# Patient Record
Sex: Male | Born: 1940 | Race: White | Hispanic: No | Marital: Married | Smoking: Former smoker
Health system: Southern US, Community
[De-identification: ages and names within clinical notes are randomized; demographics above are authoritative.]

## PROBLEM LIST (undated history)

## (undated) DIAGNOSIS — M1990 Unspecified osteoarthritis, unspecified site: Secondary | ICD-10-CM

## (undated) DIAGNOSIS — E669 Obesity, unspecified: Secondary | ICD-10-CM

## (undated) DIAGNOSIS — I2510 Atherosclerotic heart disease of native coronary artery without angina pectoris: Secondary | ICD-10-CM

## (undated) DIAGNOSIS — F528 Other sexual dysfunction not due to a substance or known physiological condition: Secondary | ICD-10-CM

## (undated) DIAGNOSIS — R32 Unspecified urinary incontinence: Secondary | ICD-10-CM

## (undated) DIAGNOSIS — N189 Chronic kidney disease, unspecified: Secondary | ICD-10-CM

## (undated) DIAGNOSIS — G4733 Obstructive sleep apnea (adult) (pediatric): Secondary | ICD-10-CM

## (undated) DIAGNOSIS — F411 Generalized anxiety disorder: Secondary | ICD-10-CM

## (undated) DIAGNOSIS — I4892 Unspecified atrial flutter: Secondary | ICD-10-CM

## (undated) DIAGNOSIS — F1010 Alcohol abuse, uncomplicated: Secondary | ICD-10-CM

## (undated) DIAGNOSIS — J309 Allergic rhinitis, unspecified: Secondary | ICD-10-CM

## (undated) DIAGNOSIS — E785 Hyperlipidemia, unspecified: Secondary | ICD-10-CM

## (undated) DIAGNOSIS — E8881 Metabolic syndrome: Secondary | ICD-10-CM

## (undated) DIAGNOSIS — I10 Essential (primary) hypertension: Secondary | ICD-10-CM

## (undated) DIAGNOSIS — M109 Gout, unspecified: Secondary | ICD-10-CM

## (undated) DIAGNOSIS — Z9989 Dependence on other enabling machines and devices: Secondary | ICD-10-CM

## (undated) DIAGNOSIS — K219 Gastro-esophageal reflux disease without esophagitis: Secondary | ICD-10-CM

## (undated) DIAGNOSIS — I4819 Other persistent atrial fibrillation: Secondary | ICD-10-CM

## (undated) DIAGNOSIS — R319 Hematuria, unspecified: Secondary | ICD-10-CM

## (undated) DIAGNOSIS — N400 Enlarged prostate without lower urinary tract symptoms: Secondary | ICD-10-CM

## (undated) DIAGNOSIS — K449 Diaphragmatic hernia without obstruction or gangrene: Secondary | ICD-10-CM

## (undated) DIAGNOSIS — I495 Sick sinus syndrome: Secondary | ICD-10-CM

## (undated) HISTORY — DX: Alcohol abuse, uncomplicated: F10.10

## (undated) HISTORY — DX: Benign prostatic hyperplasia without lower urinary tract symptoms: N40.0

## (undated) HISTORY — DX: Other sexual dysfunction not due to a substance or known physiological condition: F52.8

## (undated) HISTORY — PX: CYSTOSCOPY: SUR368

## (undated) HISTORY — PX: APPENDECTOMY: SHX54

## (undated) HISTORY — DX: Essential (primary) hypertension: I10

## (undated) HISTORY — DX: Obesity, unspecified: E66.9

## (undated) HISTORY — DX: Unspecified atrial flutter: I48.92

## (undated) HISTORY — DX: Obstructive sleep apnea (adult) (pediatric): G47.33

## (undated) HISTORY — DX: Dependence on other enabling machines and devices: Z99.89

## (undated) HISTORY — DX: Diaphragmatic hernia without obstruction or gangrene: K44.9

## (undated) HISTORY — DX: Generalized anxiety disorder: F41.1

## (undated) HISTORY — DX: Hyperlipidemia, unspecified: E78.5

## (undated) HISTORY — PX: OTHER SURGICAL HISTORY: SHX169

## (undated) HISTORY — DX: Gastro-esophageal reflux disease without esophagitis: K21.9

## (undated) HISTORY — DX: Atherosclerotic heart disease of native coronary artery without angina pectoris: I25.10

## (undated) HISTORY — DX: Allergic rhinitis, unspecified: J30.9

## (undated) HISTORY — DX: Other persistent atrial fibrillation: I48.19

## (undated) HISTORY — DX: Unspecified osteoarthritis, unspecified site: M19.90

## (undated) HISTORY — DX: Sick sinus syndrome: I49.5

## (undated) HISTORY — PX: EYE SURGERY: SHX253

## (undated) HISTORY — DX: Metabolic syndrome: E88.81

## (undated) HISTORY — DX: Metabolic syndrome: E88.810

## (undated) HISTORY — PX: HERNIA REPAIR: SHX51

---

## 1990-08-26 HISTORY — PX: ESOPHAGOGASTRODUODENOSCOPY: SHX1529

## 1997-08-26 HISTORY — PX: CHOLECYSTECTOMY: SHX55

## 1998-04-03 ENCOUNTER — Ambulatory Visit (HOSPITAL_COMMUNITY): Admission: RE | Admit: 1998-04-03 | Discharge: 1998-04-03 | Payer: Self-pay | Admitting: Gastroenterology

## 1998-04-11 ENCOUNTER — Other Ambulatory Visit: Admission: RE | Admit: 1998-04-11 | Discharge: 1998-04-11 | Payer: Self-pay | Admitting: Gastroenterology

## 1998-04-26 HISTORY — PX: HIATAL HERNIA REPAIR: SHX195

## 1998-05-04 ENCOUNTER — Inpatient Hospital Stay (HOSPITAL_COMMUNITY): Admission: RE | Admit: 1998-05-04 | Discharge: 1998-05-07 | Payer: Self-pay | Admitting: Surgery

## 2000-08-26 ENCOUNTER — Encounter: Payer: Self-pay | Admitting: Internal Medicine

## 2000-09-26 DIAGNOSIS — R319 Hematuria, unspecified: Secondary | ICD-10-CM | POA: Insufficient documentation

## 2000-09-26 DIAGNOSIS — F528 Other sexual dysfunction not due to a substance or known physiological condition: Secondary | ICD-10-CM | POA: Insufficient documentation

## 2001-08-26 HISTORY — PX: BALLOON ANGIOPLASTY, ARTERY: SHX564

## 2001-09-16 ENCOUNTER — Encounter: Payer: Self-pay | Admitting: Internal Medicine

## 2001-10-02 ENCOUNTER — Encounter: Payer: Self-pay | Admitting: *Deleted

## 2001-10-02 ENCOUNTER — Encounter: Admission: RE | Admit: 2001-10-02 | Discharge: 2001-10-02 | Payer: Self-pay | Admitting: *Deleted

## 2001-10-09 ENCOUNTER — Ambulatory Visit (HOSPITAL_COMMUNITY): Admission: RE | Admit: 2001-10-09 | Discharge: 2001-10-10 | Payer: Self-pay | Admitting: Cardiology

## 2001-11-30 ENCOUNTER — Ambulatory Visit (HOSPITAL_COMMUNITY): Admission: RE | Admit: 2001-11-30 | Discharge: 2001-11-30 | Payer: Self-pay | Admitting: *Deleted

## 2002-09-02 ENCOUNTER — Ambulatory Visit (HOSPITAL_BASED_OUTPATIENT_CLINIC_OR_DEPARTMENT_OTHER): Admission: RE | Admit: 2002-09-02 | Discharge: 2002-09-02 | Payer: Self-pay | Admitting: *Deleted

## 2002-09-02 ENCOUNTER — Encounter: Payer: Self-pay | Admitting: Pulmonary Disease

## 2002-11-03 ENCOUNTER — Encounter: Payer: Self-pay | Admitting: Pulmonary Disease

## 2003-12-25 HISTORY — PX: BASAL CELL CARCINOMA EXCISION: SHX1214

## 2005-05-03 ENCOUNTER — Ambulatory Visit: Payer: Self-pay | Admitting: Internal Medicine

## 2005-10-28 ENCOUNTER — Ambulatory Visit: Payer: Self-pay | Admitting: Internal Medicine

## 2005-11-25 ENCOUNTER — Ambulatory Visit: Payer: Self-pay | Admitting: Internal Medicine

## 2005-12-12 ENCOUNTER — Ambulatory Visit: Payer: Self-pay | Admitting: Internal Medicine

## 2005-12-16 ENCOUNTER — Ambulatory Visit: Payer: Self-pay | Admitting: Pulmonary Disease

## 2005-12-16 ENCOUNTER — Ambulatory Visit: Payer: Self-pay | Admitting: Internal Medicine

## 2005-12-16 ENCOUNTER — Encounter: Payer: Self-pay | Admitting: Internal Medicine

## 2005-12-18 ENCOUNTER — Ambulatory Visit (HOSPITAL_COMMUNITY): Admission: RE | Admit: 2005-12-18 | Discharge: 2005-12-18 | Payer: Self-pay | Admitting: Internal Medicine

## 2006-05-26 ENCOUNTER — Ambulatory Visit: Payer: Self-pay | Admitting: Internal Medicine

## 2006-06-19 ENCOUNTER — Encounter: Admission: RE | Admit: 2006-06-19 | Discharge: 2006-06-19 | Payer: Self-pay | Admitting: Cardiology

## 2006-08-18 ENCOUNTER — Ambulatory Visit: Payer: Self-pay | Admitting: Internal Medicine

## 2006-12-02 ENCOUNTER — Ambulatory Visit: Payer: Self-pay | Admitting: Internal Medicine

## 2007-01-29 ENCOUNTER — Encounter: Payer: Self-pay | Admitting: Internal Medicine

## 2007-01-29 DIAGNOSIS — B029 Zoster without complications: Secondary | ICD-10-CM | POA: Insufficient documentation

## 2007-01-29 DIAGNOSIS — R1013 Epigastric pain: Secondary | ICD-10-CM

## 2007-01-29 DIAGNOSIS — J309 Allergic rhinitis, unspecified: Secondary | ICD-10-CM | POA: Insufficient documentation

## 2007-01-29 DIAGNOSIS — I10 Essential (primary) hypertension: Secondary | ICD-10-CM | POA: Insufficient documentation

## 2007-01-29 DIAGNOSIS — I2510 Atherosclerotic heart disease of native coronary artery without angina pectoris: Secondary | ICD-10-CM | POA: Insufficient documentation

## 2007-01-29 DIAGNOSIS — K219 Gastro-esophageal reflux disease without esophagitis: Secondary | ICD-10-CM

## 2007-01-29 DIAGNOSIS — N400 Enlarged prostate without lower urinary tract symptoms: Secondary | ICD-10-CM | POA: Insufficient documentation

## 2007-01-29 DIAGNOSIS — E785 Hyperlipidemia, unspecified: Secondary | ICD-10-CM

## 2007-01-29 DIAGNOSIS — G4730 Sleep apnea, unspecified: Secondary | ICD-10-CM | POA: Insufficient documentation

## 2007-01-29 DIAGNOSIS — K3189 Other diseases of stomach and duodenum: Secondary | ICD-10-CM | POA: Insufficient documentation

## 2007-02-02 ENCOUNTER — Ambulatory Visit: Payer: Self-pay | Admitting: Internal Medicine

## 2007-02-03 LAB — CONVERTED CEMR LAB
ALT: 27 units/L (ref 0–40)
CO2: 33 meq/L — ABNORMAL HIGH (ref 19–32)
Chloride: 108 meq/L (ref 96–112)
Cholesterol: 115 mg/dL (ref 0–200)
Creatinine, Ser: 1.2 mg/dL (ref 0.4–1.5)
GFR calc Af Amer: 78 mL/min
HDL: 29.6 mg/dL — ABNORMAL LOW (ref >39.0)
LDL Cholesterol: 66 mg/dL (ref 0–99)
Sodium: 145 meq/L (ref 135–145)

## 2007-03-15 ENCOUNTER — Encounter: Admission: RE | Admit: 2007-03-15 | Discharge: 2007-03-15 | Payer: Self-pay | Admitting: Orthopaedic Surgery

## 2007-06-22 ENCOUNTER — Telehealth (INDEPENDENT_AMBULATORY_CARE_PROVIDER_SITE_OTHER): Payer: Self-pay | Admitting: *Deleted

## 2007-06-23 ENCOUNTER — Encounter: Payer: Self-pay | Admitting: Internal Medicine

## 2007-07-31 ENCOUNTER — Encounter: Payer: Self-pay | Admitting: Internal Medicine

## 2007-08-05 ENCOUNTER — Telehealth (INDEPENDENT_AMBULATORY_CARE_PROVIDER_SITE_OTHER): Payer: Self-pay | Admitting: *Deleted

## 2007-08-14 ENCOUNTER — Encounter: Payer: Self-pay | Admitting: Internal Medicine

## 2007-09-18 ENCOUNTER — Ambulatory Visit: Payer: Self-pay | Admitting: Internal Medicine

## 2007-09-18 ENCOUNTER — Ambulatory Visit: Payer: Self-pay | Admitting: Pulmonary Disease

## 2007-09-18 DIAGNOSIS — M1990 Unspecified osteoarthritis, unspecified site: Secondary | ICD-10-CM | POA: Insufficient documentation

## 2007-09-22 DIAGNOSIS — R040 Epistaxis: Secondary | ICD-10-CM

## 2008-01-11 ENCOUNTER — Encounter: Payer: Self-pay | Admitting: Internal Medicine

## 2008-02-25 ENCOUNTER — Encounter: Payer: Self-pay | Admitting: Internal Medicine

## 2008-03-25 ENCOUNTER — Ambulatory Visit: Payer: Self-pay | Admitting: Internal Medicine

## 2008-03-25 LAB — CONVERTED CEMR LAB
ALT: 25 units/L (ref 0–53)
Albumin: 4.2 g/dL (ref 3.5–5.2)
Alkaline Phosphatase: 50 units/L (ref 39–117)
BUN: 32 mg/dL — ABNORMAL HIGH (ref 6–23)
Bilirubin, Direct: 0.2 mg/dL (ref 0.0–0.3)
Chloride: 106 meq/L (ref 96–112)
Eosinophils Absolute: 0.1 10*3/uL (ref 0.0–0.7)
GFR calc Af Amer: 65 mL/min
GFR calc non Af Amer: 54 mL/min
HCT: 39.6 % (ref 39.0–52.0)
LDL Cholesterol: 49 mg/dL (ref 0–99)
MCHC: 34.3 g/dL (ref 30.0–36.0)
Monocytes Absolute: 0.4 10*3/uL (ref 0.1–1.0)
Neutro Abs: 3.3 10*3/uL (ref 1.4–7.7)
PSA: 0.36 ng/mL (ref 0.10–4.00)
Potassium: 3.5 meq/L (ref 3.5–5.1)
RBC: 4.29 M/uL (ref 4.22–5.81)
Sodium: 142 meq/L (ref 135–145)
Total Bilirubin: 1.4 mg/dL — ABNORMAL HIGH (ref 0.3–1.2)
Total CHOL/HDL Ratio: 4.6
Total Protein: 6.7 g/dL (ref 6.0–8.3)
WBC: 5.3 10*3/uL (ref 4.5–10.5)

## 2008-04-25 ENCOUNTER — Telehealth: Payer: Self-pay | Admitting: Internal Medicine

## 2008-05-05 ENCOUNTER — Encounter: Payer: Self-pay | Admitting: Internal Medicine

## 2008-06-26 HISTORY — PX: TOTAL KNEE ARTHROPLASTY: SHX125

## 2008-07-13 ENCOUNTER — Inpatient Hospital Stay (HOSPITAL_COMMUNITY): Admission: RE | Admit: 2008-07-13 | Discharge: 2008-07-18 | Payer: Self-pay | Admitting: Orthopedic Surgery

## 2008-07-14 ENCOUNTER — Encounter: Payer: Self-pay | Admitting: Internal Medicine

## 2008-07-14 ENCOUNTER — Ambulatory Visit: Payer: Self-pay | Admitting: Internal Medicine

## 2008-07-14 DIAGNOSIS — N179 Acute kidney failure, unspecified: Secondary | ICD-10-CM

## 2008-07-15 ENCOUNTER — Encounter: Payer: Self-pay | Admitting: Internal Medicine

## 2008-09-15 ENCOUNTER — Telehealth: Payer: Self-pay | Admitting: Internal Medicine

## 2008-09-29 ENCOUNTER — Encounter: Payer: Self-pay | Admitting: Internal Medicine

## 2008-09-30 ENCOUNTER — Ambulatory Visit: Payer: Self-pay | Admitting: Internal Medicine

## 2008-12-30 ENCOUNTER — Telehealth: Payer: Self-pay | Admitting: Internal Medicine

## 2009-09-08 ENCOUNTER — Telehealth: Payer: Self-pay | Admitting: Internal Medicine

## 2009-10-13 ENCOUNTER — Encounter: Payer: Self-pay | Admitting: Internal Medicine

## 2009-10-16 ENCOUNTER — Encounter: Payer: Self-pay | Admitting: Internal Medicine

## 2009-10-17 ENCOUNTER — Encounter: Payer: Self-pay | Admitting: Internal Medicine

## 2009-10-24 ENCOUNTER — Encounter: Admission: RE | Admit: 2009-10-24 | Discharge: 2009-10-24 | Payer: Self-pay | Admitting: Cardiology

## 2009-10-24 HISTORY — PX: PACEMAKER INSERTION: SHX728

## 2009-10-27 ENCOUNTER — Inpatient Hospital Stay (HOSPITAL_COMMUNITY): Admission: RE | Admit: 2009-10-27 | Discharge: 2009-10-28 | Payer: Self-pay | Admitting: Cardiology

## 2009-12-09 ENCOUNTER — Ambulatory Visit (HOSPITAL_COMMUNITY): Admission: EM | Admit: 2009-12-09 | Discharge: 2009-12-09 | Payer: Self-pay | Admitting: Emergency Medicine

## 2009-12-18 ENCOUNTER — Encounter: Payer: Self-pay | Admitting: Internal Medicine

## 2010-02-16 ENCOUNTER — Encounter: Payer: Self-pay | Admitting: Internal Medicine

## 2010-03-05 ENCOUNTER — Encounter: Payer: Self-pay | Admitting: Internal Medicine

## 2010-03-14 ENCOUNTER — Encounter: Payer: Self-pay | Admitting: Internal Medicine

## 2010-04-02 ENCOUNTER — Encounter: Payer: Self-pay | Admitting: Internal Medicine

## 2010-04-09 ENCOUNTER — Ambulatory Visit: Payer: Self-pay | Admitting: Cardiology

## 2010-04-23 ENCOUNTER — Ambulatory Visit: Payer: Self-pay | Admitting: Cardiology

## 2010-06-08 ENCOUNTER — Ambulatory Visit: Payer: Self-pay | Admitting: Cardiology

## 2010-06-08 ENCOUNTER — Encounter: Payer: Self-pay | Admitting: Internal Medicine

## 2010-06-22 ENCOUNTER — Ambulatory Visit: Payer: Self-pay | Admitting: Cardiology

## 2010-06-22 ENCOUNTER — Encounter: Payer: Self-pay | Admitting: Internal Medicine

## 2010-06-29 DIAGNOSIS — R011 Cardiac murmur, unspecified: Secondary | ICD-10-CM

## 2010-07-02 ENCOUNTER — Ambulatory Visit: Payer: Self-pay | Admitting: Internal Medicine

## 2010-07-02 DIAGNOSIS — R001 Bradycardia, unspecified: Secondary | ICD-10-CM

## 2010-07-02 DIAGNOSIS — I4891 Unspecified atrial fibrillation: Secondary | ICD-10-CM

## 2010-07-04 ENCOUNTER — Telehealth: Payer: Self-pay | Admitting: Internal Medicine

## 2010-09-10 ENCOUNTER — Ambulatory Visit
Admission: RE | Admit: 2010-09-10 | Discharge: 2010-09-10 | Payer: Self-pay | Source: Home / Self Care | Attending: Internal Medicine | Admitting: Internal Medicine

## 2010-09-10 ENCOUNTER — Encounter: Payer: Self-pay | Admitting: Internal Medicine

## 2010-09-17 ENCOUNTER — Ambulatory Visit: Payer: Self-pay | Admitting: Cardiology

## 2010-09-25 NOTE — Procedures (Signed)
Summary: [facility]    Allergies: 1)  Lexapro (Escitalopram Oxalate) 2)  Morphine Sulfate (Morphine Sulfate)  PPM Specifications Following MD:  Hillis Range, MD     Referring MD:  Johny Drilling Vendor:  [employer]     PPM Model Number:  [id number]     PPM Serial Number:  [id number] PPM DOI:  10/27/2009      Lead 1    Location: RA     DOI: 10/27/2009     Model #: 5621     Serial #: [id number]     Status: active Lead 2    Location: RV     DOI: 10/27/2009     Model #: 6295     Serial #: [id number]     Status: active  PPM Follow Up Battery Voltage:  2.99 V       PPM Device Measurements Atrium  Amplitude: 5.4 mV, Impedance: 448 ohms, Threshold: 0.5 V at 0.4 msec Right Ventricle  Amplitude: 15.2 mV, Impedance: 552 ohms, Threshold: 0.5 V at 0.4 msec  Episodes MS Episodes:  131     Percent Mode Switch:  10.6%     Ventricular High Rate:  0     Atrial Pacing:  83.2%     Ventricular Pacing:  56.7%  Parameters Mode:  MVP     Lower Rate Limit:  60     Upper Rate Limit:  130 Paced AV Delay:  180     Sensed AV Delay:  150 Next Cardiology Appt Due:  08/27/2010 Tech Comments:  PT IN AF 10.6% OF TIME. + COUMADIN.  NORMAL DEVICE FUNCTION.  CHANGED PAV FROM 180 TO 300 AND SAV FROM 150 TO .  ROV IN JAN W/JA. Vella Kohler  July 02, 2010 4:34 PM MD Comments:  agree

## 2010-09-25 NOTE — Letter (Signed)
Summary: [facility]  [facility]   Imported By: Marylou [name] 07/24/2010 08:01:31  _____________________________________________________________________  External Attachment:    Type:   Image     Comment:   External Document

## 2010-09-25 NOTE — Letter (Signed)
Summary: CMN for CPAP/[facility]  CMN for CPAP/[facility]   Imported By: Lanelle Bal 03/20/2010 12:53:48  _____________________________________________________________________  External Attachment:    Type:   Image     Comment:   External Document

## 2010-09-25 NOTE — Progress Notes (Signed)
Summary: discuss coumadin  Medications Added PRADAXA 150 MG CAPS (DABIGATRAN ETEXILATE MESYLATE) one by mouth two times a day       Phone Note Call from Patient Call back at [phone] [phone]   Caller: Spouse/Sherry Reason for Call: Talk to Nurse, Talk to Doctor Summary of Call: she wants to discuss changes in coumadin they have made a decision Initial call taken by: Omer [name],  July 04, 2010 10:17 AM  Follow-up for Phone Call        pt will try Pradaxa 150mg  two times a day. [phone] [facility] called in  No need for labs per Dr Johney Frame  Wife aware and will start Pradaxa on Sat Dennis Bast, RN, BSN  July 04, 2010 12:17 PM    New/Updated Medications: PRADAXA 150 MG CAPS (DABIGATRAN ETEXILATE MESYLATE) one by mouth two times a day Prescriptions: PRADAXA 150 MG CAPS (DABIGATRAN ETEXILATE MESYLATE) one by mouth two times a day  #60 x 6   Entered by:   Dennis Bast, RN, BSN   Authorized by:   Hillis Range, MD   Signed by:   Dennis Bast, RN, BSN on 07/04/2010   Method used:   [facility]ronically to        [facility] (retail)       [address]       [place], [place]  [address]       Ph: [phone] or [phone]       Fax: [phone]   RxID:   [id number]

## 2010-09-25 NOTE — Letter (Signed)
Summary: CMN for CPAP/[facility]  CMN for CPAP/[facility]   Imported By: Lanelle Bal 04/05/2010 14:11:13  _____________________________________________________________________  External Attachment:    Type:   Image     Comment:   External Document

## 2010-09-25 NOTE — Progress Notes (Signed)
Summary: Need Appt.  Phone Note Outgoing Call Call back at [facility] Phone [phone]   Call placed by: DeShannon Katrinka Blazing CMA Duncan Dull),  September 08, 2009 9:55 AM Call placed to: Patient Summary of Call: called and left message on machine that pt needs to schedule a follow-up appt. Patient was last seen in 09/2008 and was suppose to schedule follow-up, e-script came in today for refills, advised pt on machine I would do (90day) and to please schedule appt during that time. Initial call taken by: Mervin Hack CMA Duncan Dull),  September 08, 2009 9:57 AM

## 2010-09-25 NOTE — Miscellaneous (Signed)
Summary: Order for Humidifier/[facility]  Order for Humidifier/[facility]   Imported By: Lanelle Bal 03/08/2010 11:15:57  _____________________________________________________________________  External Attachment:    Type:   Image     Comment:   External Document

## 2010-09-25 NOTE — Assessment & Plan Note (Signed)
Summary: nep/ afib, discuss ablation. pt has uhc/. today options.  Medications Added ASPIRIN 81 MG TBEC (ASPIRIN) Take one tablet by mouth daily CARDURA 8 MG TABS (DOXAZOSIN MESYLATE) once daily PROZAC 20 MG CAPS (FLUOXETINE HCL) once daily FINASTERIDE 5 MG TABS (FINASTERIDE) once daily HYDROCHLOROTHIAZIDE 25 MG TABS (HYDROCHLOROTHIAZIDE) Take one tablet by mouth daily. 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  He was initially diagnosed with atrial fibrillation 10/03/09 when presenting for routine stress testing.  He then wore a holter monitor which documented tachycardia/ bradycardia syndrome with pauses.  He therefore underwent PPM implantation by Dr Deborah Chalk 10/28/09.  He has been chronically anticoagulated with coumain.  He was placed on multaq for his atrial fibrillation about 3 months.  Presently, he feels that he has afib 1-2 times per week.  He reports feeling tired and "washed out".  He is unaware of heart racing.  He denies CP, SOB, presyncope, or syncope.      He rides horses and motorcycles and is concerned about coumadin longterm.  He has been noncompliant with coumadin, particularly when drinking ETOH.  Current Medications (verified): 1)  Benazepril Hcl 40 Mg Tabs (Benazepril Hcl) .Marland Kitchen.. 1 Tablet By Mouth Twice A Day 2)  Pravastatin Sodium 40 Mg Tabs (Pravastatin Sodium) ....  Take 1 Tablet By Mouth Once A Day 3)  Famvir 500 Mg Tabs (Famciclovir) .Marland Kitchen.. 1 Two Times A Day For 5 Days For Herpes Outbreak or As Directed 4)  Multivitamins  Tabs (Multiple Vitamin) .Marland Kitchen.. 1 By Mouth Daily 5)  Cardura 8 Mg Tabs (Doxazosin Mesylate) .Marland Kitchen.. 1 By Mouth Daily 6)  Hydrocodone-Acetaminophen 5-500 Mg Tabs (Hydrocodone-Acetaminophen) .Marland Kitchen.. 1 Three Times A Day As Needed For Severe Knee Pain 7)  Norvasc 5 Mg  Tabs (Amlodipine Besylate) .... Take 1 Tablet By Mouth Once A Day 8)  Omega 3 .... Take 1 Tablet By Mouth Two Times A Day 9)  Aspirin 81 Mg Tbec (Aspirin) .... Take One Tablet By Mouth Daily 10)  Cardura 8 Mg Tabs (Doxazosin Mesylate) .... Once Daily 11)  Prozac 20 Mg Caps (Fluoxetine Hcl) .... Once Daily 12)  Finasteride 5 Mg Tabs (Finasteride) .... Once Daily 13)  Hydrochlorothiazide 25 Mg Tabs (Hydrochlorothiazide) .... Take One Tablet By Mouth Daily. 14)  Omega-3 Krill Oil 300 Mg Caps (Krill Oil) .... Once Daily 15)  Oxybutynin Chloride 15 Mg Xr24h-Tab (Oxybutynin Chloride) .... Once Daily 16)  Restasis 0.05 % Emul (Cyclosporine) .... Uad 17)  Warfarin Sodium 5 Mg Tabs (Warfarin Sodium) .... Use As Directed By Anticoagulation Clinic 18)  Multaq 400 Mg Tabs (Dronedarone Hcl) .... Two Times A Day  Allergies: 1)  Lexapro (Escitalopram Oxalate) 2)  Morphine Sulfate (Morphine Sulfate)  Past History:  Past Medical History: 1.  CAD s/p PCI 2003.  He had a normal stress Cardiolite study in December 2009.  Ejection fraction was 56% at  that time with no evidence of ischemia.  2.  Paroxysmal atrial fibrillation 3.  HTN 4.  ETOH use 5.  HL 6.  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  Metabolic syndrome 8.  GERD 9.  OSA, compliant with CPAP 10. Obesity   HEMATURIA, MICROSCOPIC (ICD-599.7) ERECTILE DYSFUNCTION (ICD-302.72) HERPES ZOSTER (ICD-053.9) DYSPEPSIA, CHRONIC (ICD-536.8) BENIGN PROSTATIC HYPERTROPHY (ICD-600.00) ANXIETY (ICD-300.00) ALLERGIC RHINITIS (ICD-477.9)    Past  Surgical History: ? 47   EGD  Dr Doreatha Martin 9/99   Open hiatal hernia repair 2003  Balloon angioplasty  1999  cholecystectomy (Dr Luan Pulling) 5/05   Basal cell CA - back L TKR  11/09   Dr Despina Hick [employer] PPM 3/11  Family History: Reviewed history from 01/29/2007 and no changes required. Father: Died 23 MI, HTN Mother: Died 35 Dementia, Breast CA, bone Siblings: 2 brothers alive 1 sister died of cerebral aneurysm at 69  No DM (-) stroke  Social History: Married and lives with spouse in [place] Children: 2 Occupation: Drives truck, Designer, multimedia and long distance Tob- quit x 20 years Alcohol use-yes heavy ETOH on weekends Drug use-no  Review of Systems       All systems are reviewed and negative except as listed in the HPI.   Vital Signs:  Patient profile:   70 year old male Height:      72 inches Weight:      291 pounds BMI:     39.61 Pulse rate:   64 / minute BP sitting:   124 / 70  (left arm)  Vitals Entered By: [name] [name] CMA (July 02, 2010 11:31 AM)  Physical Exam  General:  morbidly obese, NAD Head:  normocephalic and atraumatic Eyes:  PERRLA/EOM intact; conjunctiva and lids normal. Mouth:  Teeth, gums and palate normal. Oral mucosa normal. Neck:  Neck supple, no JVD. No masses, thyromegaly or abnormal cervical nodes. Chest Wall:  Pacemaker pocket is well healed Lungs:  Clear bilaterally to auscultation and percussion. Heart:  RRR, no m/r/g Abdomen:  Bowel sounds positive; abdomen soft and non-tender without masses, organomegaly, or hernias noted. No hepatosplenomegaly. Msk:  Back normal, normal gait. Muscle strength and tone normal. Pulses:  pulses normal in all 4 extremities Extremities:  1+ BLE edema Neurologic:  Alert and oriented x 3. Skin:  Intact without lesions or rashes. Cervical Nodes:  no significant adenopathy Psych:  Normal affect.   Echocardiogram  Procedure date:  10/17/2009  Findings:      Impression: Moderate LV with normal LV  systolic function Atrial Fibrillation Moderate LVH Mot=derate Bilateral Enlargement Mild Aortic Sclerosis Staanley N. Deborah Chalk, MD  Nuclear Study  Procedure date:  10/16/2009  Findings:      Overall Impression:  Normal Perfusion Images, Atrial Fibrillation.  Roger Shelter, MD      EKG  Procedure date:  07/02/2010  Findings:      AV paced at 60 bpm  MD Comments:  normal pacemaker function episodes of afib and likely atrial flutter AV delay reprogrammed today to promote intrinsic AV conduction and minimize ventricular pacing  Impression & Recommendations:  Problem # 1:  ATRIAL FIBRILLATION (ICD-427.31) The patient has symptomatic paroxysmal atrial fibrillation.  He continues to have episodes of afib despite medical therapy with multaq.  Interrogation of his pacemaker is also suggestive of a more organized atrial rhythm at times (likely atrial flutter).  He has difficulty with binge ETOH, particularly on the weekends.  His ETOH, obesity, and OSA are likely the triggers for his afib.  Therapeutic strategies for afib including medicine and ablation were discussed in detail with the patient today. Risk, benefits, and alternatives to EP study and radiofrequency ablation for afib were also discussed in detail today.  He understands that without ETOH cessation, ablation is unlikely to be successful.  We had a long discussion about ETOh cessation and he wishes to attempt complete ETOH cessation.  If he continues to have afib, then we should proceed with either a different antiarrrhytmic medicine (tikoysn) or ablation.  We also discussed pradaxa as an alternative to coumadin given the interaction of ETOh with coumadin.  He is aware that he should not drink ETOH on any anticoagulant.  He will inquire about the copay for pradaxa and will contact our office if he wishes to switch to pradaxa. I will see him again in 8 weeks to assess afib burden with complete ETOH cessation.  Problem # 2:   BRADYCARDIA (ICD-427.89) normal pacemaker function changes to AV as above to minimze ventricular pacing.  Problem # 3:  SLEEP APNEA (ICD-780.57) weight loss and compliance with CPAP encouraged  Problem # 4:  HYPERTENSION (ICD-401.9) stable  Problem # 5:  CORONARY ARTERY DISEASE (ICD-414.00) stable  Patient Instructions: 1)  Your physician recommends that you schedule a follow-up appointment in:  2 months with Dr. Johney Frame and 6 months with Device clinic.          2)  Your physician recommends that you continue on your current medications as directed. Please refer to the Current Medication list given to you today.

## 2010-09-25 NOTE — Letter (Signed)
Summary: [facility]  [facility]   Imported By: Marylou [name] 07/23/2010 13:23:58  _____________________________________________________________________  External Attachment:    Type:   Image     Comment:   External Document

## 2010-09-25 NOTE — Letter (Signed)
Summary: [facility]  [facility]   Imported By: Lanelle Bal 07/05/2010 09:07:05  _____________________________________________________________________  External Attachment:    Type:   Image     Comment:   External Document  Appended Document: [facility] BPH and bladder okay on finasteride, doxazosin and oxybutynin

## 2010-09-27 NOTE — Assessment & Plan Note (Signed)
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  He was initially diagnosed with atrial fibrillation 10/03/09 when presenting for routine stress testing.  He then wore a holter monitor which documented tachycardia/ bradycardia syndrome with pauses.  He therefore underwent PPM implantation by Dr Deborah Chalk 10/28/09.   He was placed on multaq for his atrial fibrillation. Despite medical therapy and recent abstinance of ETOH, he feels that he has afib 1-2 times per week.  He reports feeling tired and "washed out".  He is unaware of heart racing.  He denies CP, SOB, presyncope, or syncope.      Current Medications (verified): 1)  Benazepril Hcl 40 Mg Tabs (Benazepril Hcl) .Marland Kitchen.. 1 Tablet By Mouth Twice A Day 2)  Pravastatin Sodium 40 Mg Tabs (Pravastatin Sodium) .... Take 1 Tablet By Mouth Once A Day 3)  Famvir 500 Mg Tabs (Famciclovir) .Marland Kitchen.. 1 Two Times A Day For 5 Days For Herpes Outbreak or As Directed 4)  Multivitamins  Tabs (Multiple Vitamin) .Marland Kitchen.. 1 By Mouth Daily 5)  Cardura 8 Mg Tabs (Doxazosin Mesylate) .Marland Kitchen.. 1 By Mouth Daily 6)  Hydrocodone-Acetaminophen 5-500 Mg Tabs (Hydrocodone-Acetaminophen) .Marland Kitchen.. 1 Three Times A Day As Needed For Severe Knee Pain 7)  Norvasc 5 Mg  Tabs (Amlodipine Besylate) .... Take 1 Tablet By Mouth Once A Day 8)  Omega 3 .... Take 1 Tablet By Mouth Two Times A Day 9)  Aspirin 81 Mg Tbec (Aspirin) .... Take One Tablet By Mouth Daily 10)  Cardura 8 Mg Tabs (Doxazosin Mesylate) .... Once Daily 11)  Prozac 20 Mg Caps (Fluoxetine Hcl) .... Once Daily 12)  Finasteride 5 Mg Tabs (Finasteride) .... Once Daily 13)  Hydrochlorothiazide 25 Mg Tabs (Hydrochlorothiazide)  .... Take One Tablet By Mouth Daily. 14)  Omega-3 Krill Oil 300 Mg Caps (Krill Oil) .... Once Daily 15)  Oxybutynin Chloride 15 Mg Xr24h-Tab (Oxybutynin Chloride) .... Once Daily 16)  Restasis 0.05 % Emul (Cyclosporine) .... Uad 17)  Pradaxa 150 Mg Caps (Dabigatran Etexilate Mesylate) .... One By Mouth Two Times A Day 18)  Multaq 400 Mg Tabs (Dronedarone Hcl) .... Two Times A Day  Allergies: 1)  Lexapro (Escitalopram Oxalate) 2)  Morphine Sulfate (Morphine Sulfate)  Past History:  Past Medical History: Reviewed history from 07/02/2010 and no changes required. 1.  CAD s/p PCI 2003.  He had a normal stress Cardiolite study in December 2009.  Ejection fraction was 56% at       that time with no evidence of ischemia.  2.  Paroxysmal atrial fibrillation 3.  HTN 4.  ETOH use 5.  HL 6.  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  Metabolic syndrome 8.  GERD 9.  OSA, compliant with CPAP 10. Obesity   HEMATURIA, MICROSCOPIC (ICD-599.7) ERECTILE DYSFUNCTION (ICD-302.72) HERPES ZOSTER (ICD-053.9) DYSPEPSIA, CHRONIC (ICD-536.8) BENIGN PROSTATIC HYPERTROPHY (ICD-600.00) ANXIETY (ICD-300.00) ALLERGIC RHINITIS (ICD-477.9)    Past Surgical History: Reviewed history from 07/02/2010 and no changes required. ? 89   EGD  Dr Doreatha Martin 9/99   Open hiatal hernia repair 2003  Balloon angioplasty  1999  cholecystectomy (Dr Luan Pulling) 5/05   Basal cell CA - back L TKR  11/09   Dr Despina Hick [employer]  PPM 3/11  Family History: Reviewed history from 07/02/2010 and no changes required. Father: Died 66 MI, HTN Mother: Died 32 Dementia, Breast CA, bone Siblings: 2 brothers alive 1 sister died of cerebral aneurysm at 67  No DM (-) stroke  Social History: Married and lives with spouse in [place] Children: 2 Occupation: Drives truck, Designer, multimedia and long distance Tob- quit x 20 years Alcohol use-yes heavy ETOH on weekends, but recent has quit drinking Drug use-no  Review of Systems       All systems are reviewed  and negative except as listed in the HPI.   Vital Signs:  Patient profile:   70 year old male Height:      72 inches Weight:      289 pounds BMI:     39.34 Pulse rate:   64 / minute BP sitting:   126 / 72  (left arm)  Vitals Entered By: Laurance Flatten CMA (September 10, 2010 4:40 PM)  Physical Exam  General:  morbidly obese, NAD Head:  normocephalic and atraumatic Eyes:  PERRLA/EOM intact; conjunctiva and lids normal. Mouth:  Teeth, gums and palate normal. Oral mucosa normal. Neck:  Neck supple, no JVD. No masses, thyromegaly or abnormal cervical nodes. Chest Wall:  Pacemaker pocket is well healed Lungs:  Clear bilaterally to auscultation and percussion. Heart:  RRR, no m/r/g Abdomen:  Bowel sounds positive; abdomen soft and non-tender without masses, organomegaly, or hernias noted. No hepatosplenomegaly. Msk:  Back normal, normal gait. Muscle strength and tone normal. Extremities:  1+ BLE edema Neurologic:  Alert and oriented x 3. Skin:  Intact without lesions or rashes. Psych:  Normal affect.    Echocardiogram  Procedure date:  10/17/2009  Findings:      Impression: Moderate LV with normal LV systolic function Atrial Fibrillation Moderate LVH Mot=derate Bilateral Enlargement Mild Aortic Sclerosis Staanley N. Deborah Chalk, MD  Nuclear Study  Procedure date:  10/16/2009  Findings:      Overall Impression:  Normal Perfusion Images, Atrial Fibrillation.  Roger Shelter, MD     [facility] Specifications Following MD:  Hillis Range, MD     Referring MD:  [facility] Vendor:  [employer]     PPM Model Number:  [id number]     PPM Serial Number:  [id number] PPM DOI:  10/27/2009      Lead 1    Location: RA     DOI: 10/27/2009     Model #: 4098     Serial #: [id number]     Status: active Lead 2    Location: RV     DOI: 10/27/2009     Model #: 6213     Serial #: [id number]     Status: active  PPM Follow Up Remote Check?  No Battery Voltage:  3.00 V     Pacer Dependent:  No        PPM Device Measurements Atrium  Amplitude: 6.7 mV, Impedance: 440 ohms, Threshold: 1.0 V at 0.4 msec Right Ventricle  Amplitude: 10.0 mV, Impedance: 544 ohms, Threshold: 0.5 V at 0.4 msec  Episodes MS Episodes:  82     Percent Mode Switch:  3.3%      Parameters Mode:  MVP     Lower Rate Limit:  60     Upper Rate Limit:  130 Paced AV Delay:  180     Sensed AV Delay:  150 Tech Comments:  No parameter changes.  Device function normal.,  3.3% A-fib, + Pradaxa. Altha Harm, LPN  September 10, 2010 4:52 PM  MD Comments:  agree  Impression & Recommendations:  Problem # 1:  ATRIAL FIBRILLATION (ICD-427.31) The patient has symptomatic paroxysmal atrial fibrillation.  He continues to have episodes of afib despite medical therapy with multaq.  Interrogation of his pacemaker is also suggestive of a more organized atrial rhythm at times (likely atrial flutter).   His ETOH, obesity, and OSA are likely the triggers for his afib.  He has recently cut back significantly on ETOh and is compliant with CPAP but continues to have afib. Therapeutic strategies for afib including medicine and ablation were discussed in detail with the patient today. Risk, benefits, and alternatives to EP study and radiofrequency ablation for afib were also discussed in detail today.   These risks include but are not limited to stroke, bleeding, vascular damage, tamponade, perforation, damage to the esophagus, lungs, and other structures, pulmonary vein stenosis, worsening renal function, pacemaker lead dislodgement requiring pacemaker revision, and death.  He accepts these risks and  wishes to proceed.  He understands that anticipiated success rates are on the order of 80% with 1/4 patients requiring a repeat procedure. We will schedule afib ablation at the next available time.  Given organized arrhythmia by pacemaker, we will likely plan CTI ablation at the same time.  Compliance with pradaxa was encouraged.  He will hold pradaxa the  morning of the procedure only.  Problem # 2:  BRADYCARDIA (ICD-427.89) normal pacemaker function  Problem # 3:  HYPERTENSION (ICD-401.9) controlled he reports occasional hypotension with dizziness we will therefore decrease hctz to 12.5mg  daily at this time he will follow his BP closely at home.  Problem # 4:  CORONARY ARTERY DISEASE (ICD-414.00) no symptoms of ischemia

## 2010-09-27 NOTE — Cardiovascular Report (Signed)
Summary: Office Visit   Office Visit   Imported By: Roderic Ovens 09/17/2010 10:59:45  _____________________________________________________________________  External Attachment:    Type:   Image     Comment:   External Document

## 2010-10-15 ENCOUNTER — Telehealth: Payer: Self-pay | Admitting: Internal Medicine

## 2010-10-19 ENCOUNTER — Encounter: Payer: Self-pay | Admitting: Internal Medicine

## 2010-10-19 ENCOUNTER — Other Ambulatory Visit: Payer: Self-pay | Admitting: Internal Medicine

## 2010-10-19 ENCOUNTER — Other Ambulatory Visit (INDEPENDENT_AMBULATORY_CARE_PROVIDER_SITE_OTHER): Payer: 59

## 2010-10-19 DIAGNOSIS — I4891 Unspecified atrial fibrillation: Secondary | ICD-10-CM

## 2010-10-19 LAB — CBC WITH DIFFERENTIAL/PLATELET
Basophils Relative: 0.7 % (ref 0.0–3.0)
Lymphocytes Relative: 30.1 % (ref 12.0–46.0)
MCHC: 35.6 g/dL (ref 30.0–36.0)
Monocytes Relative: 7.7 % (ref 3.0–12.0)
Neutro Abs: 4.3 10*3/uL (ref 1.4–7.7)
Neutrophils Relative %: 58.6 % (ref 43.0–77.0)
Platelets: 226 10*3/uL (ref 150.0–400.0)
RDW: 13.3 % (ref 11.5–14.6)

## 2010-10-19 LAB — BASIC METABOLIC PANEL
CO2: 27 mEq/L (ref 19–32)
Creatinine, Ser: 1.2 mg/dL (ref 0.4–1.5)
GFR: 64.94 mL/min (ref >60.00)
Glucose, Bld: 125 mg/dL — ABNORMAL HIGH (ref 70–99)
Sodium: 142 mEq/L (ref 135–145)

## 2010-10-22 ENCOUNTER — Telehealth: Payer: Self-pay | Admitting: Internal Medicine

## 2010-10-22 ENCOUNTER — Encounter: Payer: Self-pay | Admitting: Internal Medicine

## 2010-10-23 NOTE — Progress Notes (Signed)
Summary: pt needs lab work   Phone Note Call from Patient   Caller: Patient Reason for Call: Talk to Nurse Summary of Call: pt stated he was told by kelly that he needed lab work prior to procedure 3-2 on the 24th, can we get an order?  Initial call taken by: Glynda Jaeger,  October 15, 2010 10:20 AM  Follow-up for Phone Call        left him a message to have his labs drawn on the 24th here and I would give him his instruction sheet at that time Dennis Bast, RN, BSN  October 15, 2010 12:14 PM

## 2010-10-23 NOTE — Letter (Addendum)
Summary: ELectrophysiology/Ablation Procedure Instructions  [employer], Main Office  [address]   [place], [place] [address]   Phone: [phone]  Fax: [phone]     Electrophysiology/Ablation Procedure Instructions    You are scheduled for a(n) atrial fibrillation ablation on 10/26/10 at 7:30am with Dr. Johney Frame.  1.  Please come to the Short Stay Center at [facility] at 5:30am _ on the day of your procedure.  2.  Come prepared to stay overnight.   Please bring your insurance cards and a list of your medications.  3.  Come to the [place] office on 10/19/10 for lab work.  The lab at [facility] is open from 8:30 AM to 1:30 PM and 2:30 PM to 5:00 PM.  The lab at [facility] is open from 7:30 AM to 5:30 PM.  You do not have to be fasting.- done.  4.  Do not have anything to eat or drink after midnight the night before your procedure.  5. Please hold Pradaxa morning of the procedure.  All of your remaining medications may be taken with a small amount of water.  6.  Educational material received:  _____ EP   ___x__ Ablation   * Occasionally, EP studies and ablations can become lengthy.  Please make your family aware of this before your procedure starts.  Average time ranges from 2-8 hours for EP studies/ablations.  Your physician will locate your family after the procedure with the results.  * If you have any questions after you get home, please call the office at [phone].  Appended Document: ELectrophysiology/Ablation Procedure Instructions TEE--10/25/10 with Dietrich Pates at 2pm check in at short stay at 1pm NPO after 7am

## 2010-10-25 ENCOUNTER — Encounter: Payer: Self-pay | Admitting: Internal Medicine

## 2010-10-25 ENCOUNTER — Ambulatory Visit (HOSPITAL_COMMUNITY)
Admission: RE | Admit: 2010-10-25 | Discharge: 2010-10-25 | Disposition: A | Discharge status: Home / Self Care | Payer: 59 | Source: Ambulatory Visit | Attending: Internal Medicine | Admitting: Internal Medicine

## 2010-10-25 DIAGNOSIS — I4891 Unspecified atrial fibrillation: Secondary | ICD-10-CM

## 2010-10-26 ENCOUNTER — Observation Stay (HOSPITAL_COMMUNITY)
Admission: RE | Admit: 2010-10-26 | Discharge: 2010-10-27 | Disposition: A | Discharge status: Home / Self Care | Payer: 59 | Source: Ambulatory Visit | Attending: Internal Medicine | Admitting: Internal Medicine

## 2010-10-26 DIAGNOSIS — I4891 Unspecified atrial fibrillation: Secondary | ICD-10-CM | POA: Insufficient documentation

## 2010-10-26 DIAGNOSIS — I10 Essential (primary) hypertension: Secondary | ICD-10-CM | POA: Diagnosis not present

## 2010-10-26 DIAGNOSIS — G4733 Obstructive sleep apnea (adult) (pediatric): Secondary | ICD-10-CM | POA: Insufficient documentation

## 2010-10-26 DIAGNOSIS — I4892 Unspecified atrial flutter: Secondary | ICD-10-CM | POA: Diagnosis present

## 2010-10-26 DIAGNOSIS — Z950 Presence of cardiac pacemaker: Secondary | ICD-10-CM | POA: Diagnosis not present

## 2010-10-26 DIAGNOSIS — F1010 Alcohol abuse, uncomplicated: Secondary | ICD-10-CM | POA: Insufficient documentation

## 2010-10-26 DIAGNOSIS — I442 Atrioventricular block, complete: Secondary | ICD-10-CM | POA: Diagnosis not present

## 2010-10-26 HISTORY — PX: OTHER SURGICAL HISTORY: SHX169

## 2010-10-26 LAB — APTT: aPTT: 43 seconds — ABNORMAL HIGH (ref 24–37)

## 2010-10-26 LAB — PROTIME-INR: INR: 1.43 (ref 0.00–1.49)

## 2010-10-27 DIAGNOSIS — I4892 Unspecified atrial flutter: Secondary | ICD-10-CM | POA: Diagnosis not present

## 2010-11-01 NOTE — Progress Notes (Signed)
Summary: questions about procedure   Phone Note Call from Patient Call back at [phone]   Caller: Patient Summary of Call: Pt calling with questions about procedure  Initial call taken by: Judie Grieve,  October 22, 2010 8:17 AM  Follow-up for Phone Call        ok to have clear liquids from Midnight until 7am then NPO after 7am TEE at 2pm pt aware Dennis Bast, RN, BSN  October 22, 2010 10:53 AM

## 2010-11-09 NOTE — Op Note (Signed)
NAMEMOHANNAD, OLIVERO NO.:  [id number]  MEDICAL RECORD NO.:  [id number]           PATIENT TYPE:  LOCATION:                                 FACILITY:  PHYSICIAN:  Hillis Range, MD       DATE OF BIRTH:  08/04/1941  DATE OF PROCEDURE: DATE OF DISCHARGE:                              OPERATIVE REPORT   PREPROCEDURE DIAGNOSES: 1. Atrial flutter. 2. Paroxysmal atrial fibrillation.  POSTPROCEDURE DIAGNOSES: 1. Atrial flutter. 2. Paroxysmal atrial fibrillation.  PROCEDURES: 1. Comprehensive EP study. 2. Coronary sinus pacing and recording. 3. 3-D mapping of SVT. 4. Radiofrequency ablation of SVT. 5. Arterial blood pressure monitoring. 6. Intracardiac echocardiography.  INTRODUCTION:  Mr. Skaggs is a pleasant 70 year old gentleman with a history of paroxysmal atrial fibrillation and also atrial flutter as documented from his pacemaker, who now presents for EP study and radiofrequency ablation.  He initially was diagnosed with atrial fibrillation in March 2011.  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 Deborah Chalk.  He has failed medical therapy with Multaq.  He therefore presents today for EP study and radiofrequency ablation.  DESCRIPTION OF PROCEDURE:  Informed written consent was obtained and the patient was brought to the electrophysiology lab in the fasting state. He was adequately sedated with intravenous medications as outlined in the anesthesia report.  The patient's right and left groins were prepped and draped in the usual sterile fashion by the EP lab staff.  Using a percutaneous Seldinger technique, two 7-French and one 8-French hemostasis sheaths were placed in the right common femoral vein.  A 4- [place] hemostasis sheath was placed in the right common femoral artery. An 11-French hemostasis sheath was placed into the left common femoral vein.  The patient's pacemaker was interrogated  today and found to be functioning appropriately and the MVP pacing mode with a lower rate limit of 60 beats per minute.  His underlying rhythm today was complete heart block and he was dependent for pacing today.  Fluoroscopy was performed which revealed stable right atrial and right ventricular lead positions.  A 7-French Biosense Webster decapolar coronary sinus catheter was introduced through the right common femoral vein and advanced into the coronary sinus for recording and pacing from this location.  A 6-French quadripolar Josephson catheter was introduced through the right common femoral vein and advanced into the right ventricle for recording and pacing.  This catheter was then pulled back to the His bundle location.  The patient presented to the electrophysiology lab in an atrial paced rhythm with an RR interval of 1020 milliseconds.  Underlying rhythm was complete heart block and a His electrogram could not be obtained.  The patient's QRS was 170 milliseconds with a QT interval of 440 milliseconds.  Ventricular pacing was performed through the 6-French core quadripolar Josephson catheter, which revealed midline decremental VA conduction with a VA Wenckebach cycle length of 440 milliseconds.  A 3.5 mm Biosense Webster EZ Marquette ThermoCool ablation catheter was introduced through the right common femoral vein and advanced into the right atrium.  Mapping of the cavotricuspid isthmus  was performed.  This revealed a long and anteriorly displaced isthmus.  A series of radiofrequency applications were delivered between the tricuspid valve annulus and the inferior vena cava along the usual cavotricuspid isthmus.  Extensive ablation along the isthmus was performed today.  I did not put up a Halo catheter to confirm isthmus block due to the patient's pacemaker which was in place. After cavotricuspid isthmus ablation, a 10-French Biosense Webster AcuNav intracardiac echocardiography  catheter was introduced through the left common femoral vein and advanced into the right atrium. Intracardiac echocardiography was performed which revealed that the patient had a moderate-sized left atrium.  He was noted to have a common ostium to the left superior and left inferior pulmonary veins.  The right superior and right inferior pulmonary veins were moderate in size. The middle common femoral vein sheath was exchanged for an 8.5 French SL2 transseptal sheath.  Transseptal access was attempted in a standard fashion using a Brockenbrough needle under biplane fluoroscopy with intracardiac echocardiography confirmation prior to transseptal puncture.  The patient was noted to have a very anteriorly displaced fossa ovalis.  Multiple attempts were made to engage the fossa and these were unsuccessful due to the very anteriorly displaced fossa.  I therefore did not attempt left atrial puncture and the transseptal sheath was pulled back into the low right atrium.  Due to the very anteriorly displaced fossa ovalis, it was felt prudent to not perform pulmonary vein isolation today.  I did not therefore engage the left atrium or perform pulmonary vein isolation.  The ablation catheter was then positioned through the SL2 sheath and positioned again along the cavotricuspid isthmus.  An additional series of radiofrequency applications were delivered along the cavotricuspid isthmus.  Following ablation, atrial pacing was performed down to a cycle length of 350 milliseconds with no arrhythmias observed.  The procedure was therefore considered completed.  All catheters were removed and the sheaths were aspirated and flushed.  A limited bedside transthoracic echocardiogram revealed no pericardial effusion.  The pacemaker was again interrogated to confirm stable lead positioning post ablation.  The battery voltage was 3 volts.  Atrial impedance was 408 ohms with a P-wave of 5.2 mV and a threshold of  1 volt at 0.4 milliseconds.  The right ventricular lead impedance was 488 ohms with a threshold of 0.5 volts at 0.3 milliseconds.  Occasional R-waves were observed, measuring 7.2 mV.  The patient was noted to have a very frequent retrograde atrial activations with ventricular pacing with a long AV delay; however, this was no longer observed with pacing at an AV delay of 200 milliseconds.  I therefore elected to reprogram the device today to the DDDR pacing mode with a sensed AV delay of 180 milliseconds and a paced AV delay of 200 milliseconds.  No other changes were made today.  I would recommend that we continue with this programmed scheme long-term as I think this will likely minimize atrial arrhythmias, which could be induced with retrograde atrial activations following ventricular pacing.  The patient was transferred to the recovery area for sheath removal per protocol. There were no early apparent complications.  CONCLUSIONS: 1. Atrial paced rhythm with underlying complete heart block upon     presentation today. 2. Cavotricuspid isthmus ablation was performed. 3  A very anteriorly displaced fossa ovalis was demonstrated fluoroscopically and also with intracardiac echocardiography.  I therefore elected to not perform transseptal puncture or atrial fibrillation ablation today. 1. No inducible arrhythmias following ablation. 2. No early apparent  complications.     Hillis Range, MD   ______________________________ Hillis Range, MD    JA/MEDQ  D:  10/26/2010  T:  10/27/2010  Job:  [id number]  cc:   Colleen Can. Deborah Chalk, M.D. Karie Schwalbe, MD  Electronically Signed by Hillis Range MD on 11/09/2010 10:53:16 PM

## 2010-11-09 NOTE — Discharge Summary (Signed)
[name] [name], [name] [name]               ACCOUNT NO.:  [id number]  MEDICAL RECORD NO.:  [id number]           PATIENT TYPE:  I  LOCATION:  2040                         FACILITY:  [facility]  PHYSICIAN:  Hillis Range, MD       DATE OF BIRTH:  [birth year]  DATE OF ADMISSION:  10/26/2010 DATE OF DISCHARGE:  10/27/2010                              DISCHARGE SUMMARY   ADMISSION DIAGNOSES: 1. Atrial fibrillation. 2. Atrial flutter.  SECONDARY DIAGNOSES: 1. Hypertension. 2. Bradycardia. 3. Intermittent complete heart block. 4. Morbid obesity. 5. Alcohol consumption. 6. Sleep apnea.  PROCEDURES: 1. EP study and radiofrequency ablation for atrial flutter. 2. Pacemaker interrogation and reprogramming.  INTRODUCTION:  Mr. [name] [name] is a pleasant 70 year old gentleman with a history of paroxysmal atrial fibrillation and atrial flutter who presents for EP study and radiofrequency ablation.  He was initially diagnosed with atrial fibrillation in 2011.  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 Deborah Chalk.  He has been treated medically with Multaq.  Despite Multaq, he continues to have episodes of atrial fibrillation and also atrial flutter.  He has a history of heavy alcohol consumption, but has tried to cut back.  He presents today for EP study and radiofrequency ablation.  HOSPITAL COURSE:  The patient was admitted to the electrophysiology service for further management.  He underwent cavotricuspid isthmus ablation.  I had planned to also perform pulmonary vein isolation, however, the patient's heart was noted to be quite rotated and transseptal access could not be achieved.  I therefore did not perform atrial fibrillation ablation, but did perform cavotricuspid isthmus ablation.  The patient has previously undergone open hiatal hernia repair in 1999 and this was felt to possibly be the reason for his cardiac rotation.  Upon admission to the  EP lab, the patient's underlying rhythm was complete heart block intermittently. Interestingly, he did have brisk retrograde AV conduction and was found to have very frequent retrograde atrial activations with ventricular pacing.  His pacemaker was therefore reprogrammed DDD with a fixed AV delay of 200 milliseconds, which completely terminated his retrograde atrial activations.  I suspect that this will improve his atrial fibrillation.  Alcohol cessation was also discussed at length with the patient as this is also a common trigger for atrial fibrillation.  In addition, he will continue to use CPAP.  The patient was observed overnight without complication.  At the time of discharge, the patient was alert, ambulatory and otherwise without complaint.  DISCHARGE CONDITION:  Good.  DISCHARGE DIET:  Cardiac prudent.  DISCHARGE INSTRUCTIONS:  Increase activities as tolerated.  No driving for 1 week.  DISCHARGE MEDICATIONS: 1. Amlodipine 5 mg daily. 2. Benazepril 40 mg b.i.d. 3. Doxazosin 4 mg daily. 4. Ganciclovir 500 mg p.r.n. 5. Finasteride 5 mg daily. 6. Fluoxetine 20 mg daily. 7. Fish oil 1000 mg daily. 8. Hydrochlorothiazide 25 mg daily. 9. Multivitamin daily. 10.Oxybutynin 15 mg daily. 11.Pradaxa 150 mg b.i.d. 12.Pravastatin 40 mg daily. 13.Restasis eyedrops 0.05% 1 drop both eyes p.r.n. 14.MegaRed Krill oil 300 mg daily. 15.Multaq 400 mg b.i.d.  FOLLOWUP:  The patient will be  contacted by my office to arrange followup within the next 4 weeks.  He will also follow up with Dr. Deborah Chalk as scheduled.     Hillis Range, MD     JA/MEDQ  D:  10/27/2010  T:  10/27/2010  Job:  [id number]  cc:   Karie Schwalbe, MD Colleen Can. Deborah Chalk, M.D.  Electronically Signed by Hillis Range MD on 11/09/2010 10:53:13 PM

## 2010-11-12 ENCOUNTER — Encounter: Payer: Self-pay | Admitting: Family Medicine

## 2010-11-12 ENCOUNTER — Ambulatory Visit (INDEPENDENT_AMBULATORY_CARE_PROVIDER_SITE_OTHER): Payer: 59 | Admitting: Family Medicine

## 2010-11-12 ENCOUNTER — Ambulatory Visit (INDEPENDENT_AMBULATORY_CARE_PROVIDER_SITE_OTHER)
Admission: RE | Admit: 2010-11-12 | Discharge: 2010-11-12 | Disposition: A | Discharge status: Home / Self Care | Payer: 59 | Source: Ambulatory Visit | Attending: Family Medicine | Admitting: Family Medicine

## 2010-11-12 ENCOUNTER — Other Ambulatory Visit: Payer: Self-pay | Admitting: Family Medicine

## 2010-11-12 DIAGNOSIS — M79609 Pain in unspecified limb: Secondary | ICD-10-CM

## 2010-11-13 ENCOUNTER — Telehealth: Payer: Self-pay | Admitting: *Deleted

## 2010-11-13 DIAGNOSIS — M79646 Pain in unspecified finger(s): Secondary | ICD-10-CM

## 2010-11-13 LAB — DIFFERENTIAL
Basophils Absolute: 0.1 10*3/uL (ref 0.0–0.1)
Eosinophils Relative: 2 % (ref 0–5)
Neutro Abs: 5.1 10*3/uL (ref 1.7–7.7)
Neutrophils Relative %: 66 % (ref 43–77)

## 2010-11-13 LAB — CBC
HCT: 40 % (ref 39.0–52.0)
MCV: 92.7 fL (ref 78.0–100.0)
Platelets: 211 10*3/uL (ref 150–400)
WBC: 7.8 10*3/uL (ref 4.0–10.5)

## 2010-11-13 LAB — PROTIME-INR: Prothrombin Time: 33 seconds — ABNORMAL HIGH (ref 11.6–15.2)

## 2010-11-13 LAB — APTT: aPTT: 46 seconds — ABNORMAL HIGH (ref 24–37)

## 2010-11-13 NOTE — Letter (Signed)
Summary: [facility]'s Orders  [facility] Orders   Imported By: Earl Many 11/01/2010 17:03:31  _____________________________________________________________________  External Attachment:    Type:   Image     Comment:   External Document

## 2010-11-13 NOTE — Telephone Encounter (Signed)
I called pt.  Normal nail bed/capillary refill and no red streaks from the PIP per patient.  Still with pain.  He has elevated the hand.  I advised him that if pain was progressive or if he had spreading erythema/nail bed changes, then to go to ER.  Elevated had in meantime and ice the hand, 10-55min at a time.  Will refer to hand center for eval, hopefully tomorrow AM.  He agrees with plan.

## 2010-11-13 NOTE — Telephone Encounter (Signed)
Pt was seen yesterday for his finger.  This is not any better, pain and swelling are worse.  He doesn't think the vicodin is helping- he took 4000 mg's yesterday and this has not touched the pain.  He is asking what else to do.  Uses [facility].

## 2010-11-17 ENCOUNTER — Encounter: Payer: Self-pay | Admitting: *Deleted

## 2010-11-18 LAB — CBC
HCT: 40.1 % (ref 39.0–52.0)
MCV: 93 fL (ref 78.0–100.0)
RBC: 4.32 MIL/uL (ref 4.22–5.81)
WBC: 4.2 10*3/uL (ref 4.0–10.5)

## 2010-11-19 ENCOUNTER — Encounter: Payer: Self-pay | Admitting: Internal Medicine

## 2010-11-20 ENCOUNTER — Encounter: Payer: Self-pay | Admitting: Family Medicine

## 2010-11-20 ENCOUNTER — Ambulatory Visit (INDEPENDENT_AMBULATORY_CARE_PROVIDER_SITE_OTHER): Payer: 59 | Admitting: Family Medicine

## 2010-11-20 VITALS — BP 120/70 | HR 76 | Temp 98.4°F | Ht 72.0 in | Wt 291.4 lb

## 2010-11-20 DIAGNOSIS — M79609 Pain in unspecified limb: Secondary | ICD-10-CM

## 2010-11-20 DIAGNOSIS — M79646 Pain in unspecified finger(s): Secondary | ICD-10-CM

## 2010-11-20 LAB — URIC ACID: Uric Acid, Serum: 8 mg/dL — ABNORMAL HIGH (ref 4.0–7.8)

## 2010-11-20 MED ORDER — COLCHICINE 0.6 MG PO TABS: 0.6000 mg | ORAL_TABLET | Freq: Every day | ORAL | Status: DC

## 2010-11-20 NOTE — Patient Instructions (Addendum)
We'll contact you with your lab report. Start taking colchicine once a day and then let me know if you aren't improving.  Stop it when you are feeling better.  Use the percocet as needed.

## 2010-11-20 NOTE — Assessment & Plan Note (Addendum)
Likely gout.  Check uric acid, start colchicine and consider dec in HCTZ depending on labs. He has some percocet to use.  I would avoid more NSAIDS for now.  Gout path/phys d/w pt and he understood.  GI/statin caution given re: colchicine.  He'll call back with update.  I don't suspect fx.  Pseudogout is possible.

## 2010-11-20 NOTE — Progress Notes (Signed)
He had follow up at hand center.  Rexrayed and no fx seen.  Now with L 2nd DIP red and tender, skin is sensitive.  H/o pain in R 1st toe but no h/o gout.  No recent trauma to explain the sx.    Has 20 percocet left from the hand center.  Has some relief from this.  Meds, vitals, and allergies reviewed.   ROS: See HPI.  Otherwise, noncontributory.  NAD L hand with swelling but dec in erythema on 3rd PIP.  Distally NV intact. 2nd DIP with erythema and edema, painful rom.  distally NV intact.  No other active synovitis.

## 2010-11-22 NOTE — Assessment & Plan Note (Signed)
Summary: SWOLLEN FINGER ON LEFT HAND / LFW   Vital Signs:  Patient profile:   70 year old male Height:      72 inches Weight:      288.25 pounds BMI:     39.24 Temp:     98.7 degrees F oral Pulse rate:   96 / minute Pulse rhythm:   regular BP sitting:   142 / 84  (left arm) Cuff size:   large  Vitals Entered By: [name] Shan CMA Byrant Valent [name]) (November 12, 2010 2:34 PM) CC: Swollen finger, left hand   History of Present Illness: L 3rd finger was sore and woke patient up Friday AM.  Same since then.  Pain with range of motion.  Throbbing.  Minimal redness.  He hit his finger on the steering wheel Thursday.  PIP is the center of the pain.  No FCNAV.  No other c/o.   Allergies: 1)  Lexapro (Escitalopram Oxalate) 2)  Morphine Sulfate (Morphine Sulfate)  Social History: Married and lives with spouse in [place] Children: 2 Occupation: Drives truck, local now, former long distance Tob- quit x 20 years Alcohol use-yes heavy ETOH on weekends, but recent has quit drinking Drug use-no  Review of Systems       See HPI.  Otherwise negative.    Physical Exam  General:  no apparent distress  L hand wnl except for 3rd digit.  MCP not tender to palpation and normal range of motion at MCP.  PIP puffy with pain on range of motion.  DIP with ROM intact.  No deficit on testing collateral/flexor/ext tendons in 3rd digit.  distally vasc intact.    Impression & Recommendations:  Problem # 1:  FINGER PAIN (ICD-729.5) xray neg for acute change.  films reviewed.  d/w patient.  Use vicodin as needed for pain, elevate the hand and splinted here in clinic with volar splint.  Wear this for 2 weeks and then follow up with me.  We can determine further time in splint at next OV.  He agrees.  Fu as needed in meantime, esp if increase in pain, edema.  He agrees with plan.  he tolerated spint.  Orders: T-Finger(s) (73140TC)  Complete Medication List: 1)  Benazepril Hcl 40 Mg Tabs (Benazepril hcl) .Marland Kitchen.. 1  tablet by mouth twice a day 2)  Pravastatin Sodium 40 Mg Tabs (Pravastatin sodium) .... Take 1 tablet by mouth once a day 3)  Famvir 500 Mg Tabs (Famciclovir) .Marland Kitchen.. 1 two times a day for 5 days for herpes outbreak or as directed 4)  Multivitamins Tabs (Multiple vitamin) .Marland Kitchen.. 1 by mouth daily 5)  Cardura 8 Mg Tabs (Doxazosin mesylate) .Marland Kitchen.. 1 by mouth daily 6)  Hydrocodone-acetaminophen 5-500 Mg Tabs (Hydrocodone-acetaminophen) .Marland Kitchen.. 1 three times a day as needed for severe knee pain 7)  Norvasc 5 Mg Tabs (Amlodipine besylate) .... Take 1 tablet by mouth once a day 8)  Omega 3  .... Take 1 tablet by mouth two times a day 9)  Aspirin 81 Mg Tbec (Aspirin) .... Take one tablet by mouth daily 10)  Cardura 8 Mg Tabs (Doxazosin mesylate) .... Once daily 11)  Prozac 20 Mg Caps (Fluoxetine hcl) .... Once daily 12)  Finasteride 5 Mg Tabs (Finasteride) .... Once daily 13)  Hydrochlorothiazide 25 Mg Tabs (Hydrochlorothiazide) .... Take one tablet by mouth daily. 14)  Omega-3 Krill Oil 300 Mg Caps (Krill oil) .... Once daily 15)  Oxybutynin Chloride 15 Mg Xr24h-tab (Oxybutynin chloride) .... Once daily 16)  Restasis  0.05 % Emul (Cyclosporine) .... Uad 17)  Pradaxa 150 Mg Caps (Dabigatran etexilate mesylate) .... One by mouth two times a day 18)  Multaq 400 Mg Tabs (Dronedarone hcl) .... Two times a day 19)  Vicodin 5-500 Mg Tabs (Hydrocodone-acetaminophen) .Marland Kitchen.. 1-2 by mouth three times a day as needed for pain, sedation caution.  Patient Instructions: 1)  Keep the splint on and use the pain meds as needed.  It can make you drowsy.  If you have more pain or swelling, let me know.  Keep your hand elevated.  Take care and let me recheck your hand in 2 weeks.  Glad to see you today.  Prescriptions: VICODIN 5-500 MG TABS (HYDROCODONE-ACETAMINOPHEN) 1-2 by mouth three times a day as needed for pain, sedation caution.  #30 x 0   Entered and Authorized by:   [name] Givens MD   Signed by:   [name] Givens MD on  11/12/2010   Method used:   Print then Give to Patient   RxID:   [id number]    Orders Added: 1)  Est. Patient Level III [14782] 2)  T-Finger(s) [73140TC]    Current Allergies (reviewed today): LEXAPRO (ESCITALOPRAM OXALATE) MORPHINE SULFATE (MORPHINE SULFATE)

## 2010-11-23 ENCOUNTER — Ambulatory Visit: Payer: Self-pay | Admitting: Nurse Practitioner

## 2010-11-26 ENCOUNTER — Encounter: Payer: Self-pay | Admitting: Family Medicine

## 2010-11-26 ENCOUNTER — Ambulatory Visit (INDEPENDENT_AMBULATORY_CARE_PROVIDER_SITE_OTHER): Payer: 59 | Admitting: Family Medicine

## 2010-11-26 DIAGNOSIS — M109 Gout, unspecified: Secondary | ICD-10-CM | POA: Insufficient documentation

## 2010-11-26 MED ORDER — FAMCICLOVIR 500 MG PO TABS: 500.0000 mg | ORAL_TABLET | Freq: Two times a day (BID) | ORAL | Status: DC

## 2010-11-26 MED ORDER — OXYCODONE-ACETAMINOPHEN 5-325 MG PO TABS: 1.0000 | ORAL_TABLET | Freq: Four times a day (QID) | ORAL | Status: DC | PRN

## 2010-11-26 NOTE — Assessment & Plan Note (Addendum)
Continue colchicine for now with percocet prn. Sedation caution.  Vicodin wasn't effective. He has a list of foods to avoid.  He'll talk to cards about coming off the HCTZ.  I wouldn't stop it today and risk a re-flare of sx.  He understood.  I would use colchicine a few more days.  I wrote an rx note asking for pharmacy to allow early refill if needed on colchicine.  Overall improved.

## 2010-11-26 NOTE — Patient Instructions (Signed)
Keep taking the colchicine for another 2 weeks (1 a day).  Talk to cardiology about coming off the HCTZ.  Use the oxycodone if needed.  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  Ask for a slot.  Take care.

## 2010-11-27 ENCOUNTER — Encounter: Payer: Self-pay | Admitting: Family Medicine

## 2010-11-27 ENCOUNTER — Other Ambulatory Visit: Payer: Self-pay | Admitting: Family Medicine

## 2010-11-27 DIAGNOSIS — N400 Enlarged prostate without lower urinary tract symptoms: Secondary | ICD-10-CM

## 2010-11-27 DIAGNOSIS — I4891 Unspecified atrial fibrillation: Secondary | ICD-10-CM

## 2010-11-27 NOTE — Progress Notes (Signed)
Gout- L 2nd/3rd finger pain.   Took colchicine bid with some improvement but had diarrhea.  Erythema improved . He is still having some swelling but ROM is gradually improving.  He is out of oxycodone and asking about options.  We discussed labs today.    Meds, vitals, and allergies reviewed.   ROS: See HPI.  Otherwise, noncontributory.  nad L hand with normal inspection except for DIP in 2nd finger and PIP in 3rd.  No erythema but edema at the joints noted.  ROM improved from prev and less ttp.  Distally NV intact on the hand.

## 2010-11-28 ENCOUNTER — Other Ambulatory Visit (INDEPENDENT_AMBULATORY_CARE_PROVIDER_SITE_OTHER): Payer: 59 | Admitting: Internal Medicine

## 2010-11-28 ENCOUNTER — Encounter: Payer: 59 | Admitting: Internal Medicine

## 2010-11-28 DIAGNOSIS — I4891 Unspecified atrial fibrillation: Secondary | ICD-10-CM

## 2010-11-28 DIAGNOSIS — N400 Enlarged prostate without lower urinary tract symptoms: Secondary | ICD-10-CM

## 2010-11-28 DIAGNOSIS — E785 Hyperlipidemia, unspecified: Secondary | ICD-10-CM

## 2010-11-28 LAB — COMPREHENSIVE METABOLIC PANEL
Albumin: 4.2 g/dL (ref 3.5–5.2)
Alkaline Phosphatase: 71 U/L (ref 39–117)
BUN: 21 mg/dL (ref 6–23)
Calcium: 9.8 mg/dL (ref 8.4–10.5)
Chloride: 103 mEq/L (ref 96–112)
Glucose, Bld: 110 mg/dL — ABNORMAL HIGH (ref 70–99)
Potassium: 4.1 mEq/L (ref 3.5–5.1)
Sodium: 143 mEq/L (ref 135–145)
Total Protein: 7.3 g/dL (ref 6.0–8.3)

## 2010-11-28 LAB — CBC WITH DIFFERENTIAL/PLATELET
Basophils Relative: 1.1 % (ref 0.0–3.0)
Eosinophils Relative: 2.1 % (ref 0.0–5.0)
Lymphocytes Relative: 28.1 % (ref 12.0–46.0)
Monocytes Relative: 8.3 % (ref 3.0–12.0)
Neutrophils Relative %: 60.4 % (ref 43.0–77.0)
Platelets: 203 10*3/uL (ref 150.0–400.0)
RBC: 4.59 Mil/uL (ref 4.22–5.81)
WBC: 6 10*3/uL (ref 4.5–10.5)

## 2010-11-28 LAB — TSH: TSH: 0.79 u[IU]/mL (ref 0.35–5.50)

## 2010-11-28 LAB — LIPID PANEL
HDL: 25.9 mg/dL — ABNORMAL LOW (ref >39.00)
Triglycerides: 219 mg/dL — ABNORMAL HIGH (ref 0.0–149.0)
VLDL: 43.8 mg/dL — ABNORMAL HIGH (ref 0.0–40.0)

## 2010-11-28 LAB — PSA: PSA: 0.1 ng/mL (ref 0.10–4.00)

## 2010-11-30 ENCOUNTER — Ambulatory Visit: Payer: Self-pay | Admitting: Nurse Practitioner

## 2010-12-03 ENCOUNTER — Encounter: Payer: Self-pay | Admitting: Nurse Practitioner

## 2010-12-04 ENCOUNTER — Encounter: Payer: Self-pay | Admitting: Family Medicine

## 2010-12-04 ENCOUNTER — Ambulatory Visit (INDEPENDENT_AMBULATORY_CARE_PROVIDER_SITE_OTHER): Payer: 59 | Admitting: Family Medicine

## 2010-12-04 VITALS — BP 136/84 | HR 80 | Temp 98.6°F | Wt 289.1 lb

## 2010-12-04 DIAGNOSIS — I10 Essential (primary) hypertension: Secondary | ICD-10-CM

## 2010-12-04 DIAGNOSIS — G4730 Sleep apnea, unspecified: Secondary | ICD-10-CM

## 2010-12-04 DIAGNOSIS — E785 Hyperlipidemia, unspecified: Secondary | ICD-10-CM

## 2010-12-04 DIAGNOSIS — I2510 Atherosclerotic heart disease of native coronary artery without angina pectoris: Secondary | ICD-10-CM

## 2010-12-04 DIAGNOSIS — M109 Gout, unspecified: Secondary | ICD-10-CM

## 2010-12-04 DIAGNOSIS — Z0000 Encounter for general adult medical examination without abnormal findings: Secondary | ICD-10-CM

## 2010-12-04 DIAGNOSIS — R454 Irritability and anger: Secondary | ICD-10-CM

## 2010-12-04 DIAGNOSIS — Z0289 Encounter for other administrative examinations: Secondary | ICD-10-CM

## 2010-12-04 DIAGNOSIS — N400 Enlarged prostate without lower urinary tract symptoms: Secondary | ICD-10-CM

## 2010-12-04 DIAGNOSIS — Z024 Encounter for examination for driving license: Secondary | ICD-10-CM

## 2010-12-04 DIAGNOSIS — R319 Hematuria, unspecified: Secondary | ICD-10-CM

## 2010-12-04 DIAGNOSIS — I4891 Unspecified atrial fibrillation: Secondary | ICD-10-CM

## 2010-12-04 LAB — POCT URINALYSIS DIPSTICK
Glucose, UA: NEGATIVE
Ketones, UA: NEGATIVE
Spec Grav, UA: 1.03
Urobilinogen, UA: NEGATIVE

## 2010-12-04 NOTE — Assessment & Plan Note (Addendum)
D/w re: routine health maintenance.  D/w patient ZO:[id number] for colon cancer screening, including IFOB vs. colonoscopy.  Risks and benefits of both were discussed and patient voiced understanding.  Pt elects WUJ:WJXB.  D/w pt JY:NWGN, sugar, weight.  Flu shot encouraged.  Other vaccines up to date except for shingles vaccine, this was encouraged.    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

## 2010-12-04 NOTE — Assessment & Plan Note (Signed)
PSA wnl.  Minimal enlargement on exam, stable.

## 2010-12-04 NOTE — Assessment & Plan Note (Signed)
Mild, controlled.  Continue prozac.

## 2010-12-04 NOTE — Assessment & Plan Note (Addendum)
Controlled. No daytime fatigue.  No changes in management now. D/w pt re: gradual weight loss.

## 2010-12-04 NOTE — Assessment & Plan Note (Signed)
Some better.  He'll check with cards about coming off HCTZ.

## 2010-12-04 NOTE — Patient Instructions (Addendum)
Check with your insurance to see if they will cover the shingles shot. Don't change your meds.   Talk to Terri in the lab about the stool cards.   Work on gradually losing weight. Talk to the cardiologist about coming off the HCTZ.  Let me know if you have more trouble with gout.

## 2010-12-05 ENCOUNTER — Encounter: Payer: Self-pay | Admitting: Family Medicine

## 2010-12-05 NOTE — Assessment & Plan Note (Signed)
No change in meds. Controlled.  Tolerating statin.

## 2010-12-05 NOTE — Progress Notes (Signed)
CPE- See plan.  Routine anticipatory guidance given to patient.  See health maintenance in plan.  DOT physical- see scanned forms.    H/o PAF and CAD s/p stent.  No palpitations, Cp,SOB, ble edema, syncope.  Compliant with meds.  Has routine follow up with cards pending but is not having any signs or symptoms of uncontrolled disease.   Recent labs d/w pt.   Hypertension:    Using medication without problems or lightheadedness: yes Chest pain with exertion:no Edema:no Short of breath:no  Hematuria and BPH.  See plan. No blood noted in urine by patient.  PSA wnl.  OSA controlled on CPAP.  No daytime fatigue.  Not drowsy.  Compliant and doing well with this. D/w pt about gradual weight loss.   H/o cataract surgery. No symptoms now and doesn't use/need glasses for driving.  No visual complaints.   Gout- h/o elevated uric acid and likely gout flare in L hand.  He is not taking NSAIDS due to other long term meds. He is improving and currently has some discomfort in the L 3rd PIP and 2nd DIP, but his is improving.  He doesn't have dec in ROM that would prevent driving, manual manipulation of objects.  Had been taking percocet for the gout flare on a prn basis w/o sedation. No h/o opiate abuse with this.   PMH and SH reviewed  Meds, vitals, and allergies reviewed.   ROS: See HPI.  Otherwise negative.  Feeling well o/w.  GEN: nad, alert and oriented HEENT: mucous membranes moist, tm wnl, PERRL, EOMI nasal exam wnl NECK: supple w/o LA CV: rrr. No ectopy. Pacer palpated on chest wall.  PULM: ctab, no inc wob ABD: soft, +bs, old scar noted EXT: no edema SKIN: no acute rash Back w/o midline pain. CN 2-12 wnl B, S/S/DTR wnl x4 L hand with some edema still noted at 3rd PIP and 2nd DIP but this is improved.  Better rom today compared to prev and distally nv intact.  Prostate gland firm and smooth, mild enlargement, but no nodularity, tenderness, mass, asymmetry or induration.

## 2010-12-05 NOTE — Assessment & Plan Note (Signed)
Controlled, no change in meds. Pt to talk with cards about HCTZ and potentially stopping this due to h/o gout.

## 2010-12-05 NOTE — Assessment & Plan Note (Addendum)
Noted today. Will refer to uro.  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

## 2010-12-05 NOTE — Assessment & Plan Note (Signed)
Has no sx.  His history would not prevent him from safely operating a vehicle.  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  D/w pt about secondary prevention.  He has routine fu with cards.

## 2010-12-05 NOTE — Assessment & Plan Note (Signed)
Per cards.  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

## 2010-12-07 ENCOUNTER — Telehealth: Payer: Self-pay | Admitting: *Deleted

## 2010-12-07 ENCOUNTER — Ambulatory Visit: Payer: Self-pay | Admitting: Nurse Practitioner

## 2010-12-07 NOTE — Telephone Encounter (Signed)
Message copied by Delilah Shan on Fri Dec 07, 2010  3:42 PM ------      Message from: Raechel Ache      Created: Wed Dec 05, 2010 12:15 PM       I put in the referral to Dr. Annabell Howells bases on the urine micro.  I haven't told the patient about this.  He is coming to today to pick up his forms.  I'll have them done by then.  I need them copied, scanned, and given to patient.  Please tell patient then about the micro (I did see some blood on the micro)- he knows about the dipstick findings from yesterday.  He'll need to see Shirlee Limerick on the way out today.  Thanks.

## 2010-12-07 NOTE — Telephone Encounter (Signed)
Patient advised.

## 2010-12-10 ENCOUNTER — Other Ambulatory Visit (INDEPENDENT_AMBULATORY_CARE_PROVIDER_SITE_OTHER): Payer: 59 | Admitting: Family Medicine

## 2010-12-10 ENCOUNTER — Other Ambulatory Visit: Payer: 59

## 2010-12-10 DIAGNOSIS — R195 Other fecal abnormalities: Secondary | ICD-10-CM

## 2010-12-10 DIAGNOSIS — Z1211 Encounter for screening for malignant neoplasm of colon: Secondary | ICD-10-CM

## 2010-12-10 NOTE — Progress Notes (Signed)
Addended by: Raechel Ache on: 12/10/2010 08:56 PM   Modules accepted: Orders

## 2010-12-10 NOTE — Progress Notes (Signed)
Please notify pt that IFOB was positive.  I would have him talk to GI.  I put in the referral.  Please notify Shirlee Limerick if he agrees to referral.  Thanks.

## 2010-12-11 ENCOUNTER — Telehealth: Payer: Self-pay | Admitting: *Deleted

## 2010-12-11 NOTE — Telephone Encounter (Signed)
Letter  

## 2010-12-11 NOTE — Telephone Encounter (Signed)
Message copied by Delilah Shan on Tue Dec 11, 2010  6:07 PM ------      Message from: Raechel Ache      Created: Mon Dec 10, 2010  8:56 PM       IFOB +.  I put in GI referral.  Da Michelle- please call patient.  If he agrees to referral, notify Shirlee Limerick to proceed.  Thanks.

## 2010-12-11 NOTE — Telephone Encounter (Signed)
Patient advised.  He will look to hear from [facility] for referral info.

## 2010-12-13 ENCOUNTER — Ambulatory Visit (INDEPENDENT_AMBULATORY_CARE_PROVIDER_SITE_OTHER): Payer: 59 | Admitting: Internal Medicine

## 2010-12-13 ENCOUNTER — Encounter: Payer: Self-pay | Admitting: Internal Medicine

## 2010-12-13 VITALS — BP 151/88 | HR 63 | Ht 72.0 in | Wt 290.0 lb

## 2010-12-13 DIAGNOSIS — I498 Other specified cardiac arrhythmias: Secondary | ICD-10-CM

## 2010-12-13 DIAGNOSIS — I10 Essential (primary) hypertension: Secondary | ICD-10-CM

## 2010-12-13 DIAGNOSIS — G4730 Sleep apnea, unspecified: Secondary | ICD-10-CM

## 2010-12-13 DIAGNOSIS — I2510 Atherosclerotic heart disease of native coronary artery without angina pectoris: Secondary | ICD-10-CM

## 2010-12-13 DIAGNOSIS — I4891 Unspecified atrial fibrillation: Secondary | ICD-10-CM

## 2010-12-13 NOTE — Patient Instructions (Addendum)
Your physician recommends that you schedule a follow-up appointment in: 3 months with Dr Johney Frame  Your physician has recommended you make the following change in your medication: stop Multaq

## 2010-12-16 ENCOUNTER — Encounter: Payer: Self-pay | Admitting: Internal Medicine

## 2010-12-16 NOTE — Progress Notes (Signed)
The patient presents today for routine electrophysiology followup.  Since last being seen in our clinic, the patient reports doing very well.  He reports no complications related to his recent atrial flutter ablation.  He is unware of any further arrhythmias.  He remains quite active.  Unfortunately, he continues to drink ETOH moderately.  His primary concern is that he wishes to "get off of some of his medicine".  Today, he denies symptoms of palpitations, chest pain, shortness of breath, orthopnea, PND, lower extremity edema, dizziness, presyncope, syncope, or neurologic sequela.  The patient feels that he is tolerating medications without difficulties and is otherwise without complaint today.   Past Medical History  Diagnosis Date  . CAD (coronary artery disease)     s/p PCI 2003. Last stress test in Feb 2011 was normal  . Paroxysmal atrial fibrillation     on Multaq and Pradaxa  . HTN (hypertension)   . ETOH abuse   . DJD (degenerative joint disease)     s/p Left TKA 06/2008  . Metabolic syndrome   . Hematuria   . Psychosexual dysfunction with inhibited sexual excitement   . Herpes zoster without mention of complication   . Hypertrophy of prostate without urinary obstruction and other lower urinary tract symptoms (LUTS)   . Anxiety state, unspecified   . Allergic rhinitis, cause unspecified   . OSA on CPAP     compliant with CPAP  . Obesity   . Hyperlipidemia   . GERD (gastroesophageal reflux disease)   . Sick sinus syndrome     s/p PPM by Dr Deborah Chalk  . Atrial flutter     s/p atrial flutter CTI ablation 10/26/10   Past Surgical History  Procedure Date  . Esophagogastroduodenoscopy 1992    Dr. Doreatha Martin  . Hiatal hernia repair 04/1998  . Balloon angioplasty, artery 2003    Ostium of the 2nd DX  . Cholecystectomy 1999    Dr. Luan Pulling  . Basal cell carcinoma excision 12/2003    On back  . Total knee arthroplasty 06/2008    Dr. Despina Hick  . Pacemaker insertion 10/2009    [employer]    . Atrial flutter ablation 10/26/10    Current Outpatient Prescriptions  Medication Sig Dispense Refill  . amLODipine (NORVASC) 5 MG tablet Take 5 mg by mouth 2 (two) times daily.        . benazepril (LOTENSIN) 40 MG tablet Take 40 mg by mouth 2 (two) times daily.        . cycloSPORINE (RESTASIS) 0.05 % ophthalmic emulsion 1 drop as directed.        . dabigatran (PRADAXA) 150 MG CAPS Take 150 mg by mouth every 12 (twelve) hours.        Marland Kitchen doxazosin (CARDURA) 8 MG tablet Take 4 mg by mouth at bedtime.       . famciclovir (FAMVIR) 500 MG tablet Take 1 tablet (500 mg total) by mouth 2 (two) times daily. For 5 days then stop  30 tablet  3  . finasteride (PROSCAR) 5 MG tablet Take 5 mg by mouth daily.        . fish oil-omega-3 fatty acids 1000 MG capsule Take 2 g by mouth as directed.        Marland Kitchen FLUoxetine (PROZAC) 20 MG capsule Take 20 mg by mouth daily.        . hydrochlorothiazide 25 MG tablet Take 25 mg by mouth daily.        . Multiple Vitamin (MULTIVITAMIN  PO) Take by mouth.        . oxybutynin (DITROPAN XL) 15 MG 24 hr tablet Take 15 mg by mouth daily.        . pravastatin (PRAVACHOL) 40 MG tablet Take 40 mg by mouth daily.        . colchicine 0.6 MG tablet Take 1 tablet (0.6 mg total) by mouth daily.  30 tablet  2  . oxyCODONE-acetaminophen (PERCOCET) 5-325 MG per tablet Take 1-2 tablets by mouth every 6 (six) hours as needed for pain. Take 1-2 tablets every six hours as needed for pain  40 tablet  0    Allergies  Allergen Reactions  . Escitalopram Oxalate     REACTION: no ejaculation  . Morphine Sulfate     REACTION: unspecified    History   Social History  . Marital Status: Married    Spouse Name: N/A    Number of Children: 2  . Years of Education: N/A   Occupational History  . TRUCK DRIVER Counselling psychologist   Social History Main Topics  . Smoking status: Former Smoker    Quit date: 08/26/1990  . Smokeless tobacco: Not on file  . Alcohol Use: Yes      moderate ETOH use  . Drug Use: No  . Sexually Active: Not on file   Other Topics Concern  . Not on file   Social History Narrative  . No narrative on file    Family History  Problem Relation Age of Onset  . Breast cancer Mother   . Bone cancer Mother   . Dementia Mother   . Cancer Mother     metastatic breast cancer  . Heart attack Father   . Hypertension Father   . Aneurysm Sister     ROS-  All systems are reviewed and are negative except as outlined in the HPI above  Physical Exam: Filed Vitals:   12/13/10 1032  BP: 151/88  Pulse: 63  Height: 6' (1.829 m)  Weight: 290 lb (131.543 kg)    GEN- The patient is well appearing, alert and oriented x 3 today.   Head- normocephalic, atraumatic Eyes-  Sclera clear, conjunctiva pink Ears- hearing intact Oropharynx- clear Neck- supple, no JVP Lymph- no cervical lymphadenopathy Lungs- Clear to ausculation bilaterally, normal work of breathing Chest- pacemaker pocket is well healed Heart- Regular rate and rhythm, no murmurs, rubs or gallops, PMI not laterally displaced GI- soft, NT, ND, + BS Extremities- no clubbing, cyanosis, or edema MS- no significant deformity or atrophy Skin- no rash or lesion Psych- euthymic mood, full affect Neuro- strength and sensation are intact  Pacemaker interrogation- reviewed in detail today,  See PACEART report  Assessment and Plan:

## 2010-12-16 NOTE — Assessment & Plan Note (Signed)
Compliance with CPAP encouraged 

## 2010-12-16 NOTE — Assessment & Plan Note (Signed)
Above goal Pt is reluctant to change medicine at this time Salt restriction advised

## 2010-12-16 NOTE — Assessment & Plan Note (Addendum)
Doing well s/p CTI ablation for atrial flutter. His atrial arrhythmia burden by PPM has decreased from 10% to 5%.  He continues to have occasional episodes of atrial fibrillation, but no further atrial flutter.  ETOH cessation and CPAP compliance are also advised as these are triggers for afib. At this time, the patient is clear that he wishes to stop multaq.  I have advised that he continue this AAD however he states that he is "taking too many medicines".  At his request, we will stop mutlaq at this time.  He says that he will comply with ETOH cessation.  We will see him again in 3 months to assess his arrhythmia burden off multaq.  The importance with compliance of pradaxa was stressed today.  His CHADSVasc Score is 3, CHADS2 score is 1.

## 2010-12-16 NOTE — Assessment & Plan Note (Signed)
Normal pacemaker function See Pace Art report No changes today  

## 2010-12-16 NOTE — Assessment & Plan Note (Signed)
No ischemic symptoms No changes today 

## 2010-12-23 ENCOUNTER — Other Ambulatory Visit: Payer: Self-pay | Admitting: Cardiology

## 2010-12-23 DIAGNOSIS — I10 Essential (primary) hypertension: Secondary | ICD-10-CM

## 2010-12-31 ENCOUNTER — Encounter: Payer: Self-pay | Admitting: Family Medicine

## 2011-01-08 NOTE — Op Note (Signed)
[name] [name], [name] [name] NO.:  [id number]   MEDICAL RECORD NO.:  [id number]          PATIENT TYPE:  INP   LOCATION:  0002                         FACILITY:  [facility]   PHYSICIAN:  Ollen Gross, M.D.    DATE OF BIRTH:  05-Dec-1940   DATE OF PROCEDURE:  07/13/2008  DATE OF DISCHARGE:                               OPERATIVE REPORT   PREOPERATIVE DIAGNOSIS:  Osteoarthritis left knee.   POSTOPERATIVE DIAGNOSIS:  Osteoarthritis left knee.   PROCEDURE:  Left total knee arthroplasty.   SURGEON:  Ollen Gross, M.D.   ASSISTANT:  Alexzandrew L. Perkins, P.A.C.   ANESTHESIA:  Spinal with Duramorph.   ESTIMATED BLOOD LOSS:  Minimal.   DRAINS:  Hemovac times one.   TOURNIQUET TIME:  39 minutes at 300 mmHg.   COMPLICATIONS:  None.   CONDITION:  Stable to recovery.   CLINICAL NOTE:  [name] [name] is a 70 year old male with end-stage  arthritis of the left knee with progressively worsening pain and  dysfunction.  He has failed nonoperative management including injection  and presents now for left total knee arthroplasty.   PROCEDURE IN DETAIL:  After the successful administration of spinal  anesthetic a tourniquet was placed high on the left thigh and left lower  extremity prepped and draped in the usual sterile fashion.  Extremity  wrapped in Esmarch, knee flexed and tourniquet inflated 300 mmHg.  Standard midline incision was made with a 10 blade through subcutaneous  tissue to the level of the extensor mechanism.  A fresh blade was used  to make a medial parapatellar arthrotomy.  Soft tissue over the proximal  medial tibia was subperiosteally elevated to the joint line with the  knife and into the semimembranosus bursa with the Cobb elevator.  Soft  tissue laterally was elevated with attention being paid to avoid the  patellar tendon on tibial tubercle.  The patella was subluxed laterally  and knee flexed 90 degrees and ACL and PCL removed.  Drill was used to  create  a starting hole in the distal femur and the canal was thoroughly  irrigated.  The 5 degree left valgus alignment guide was placed  referencing off the posterior condyles rotation was marked and the block  pinned to remove 11 mm off the distal femur.  I took 11 because of preop  flexion contracture.  Distal femoral resection was made with an  oscillating saw.  Sizing block was placed.  Size 5 was most appropriate.  Rotation was marked at the epicondylar axis.  Size 5 cutting block was  placed and the anterior-posterior chamfer cuts were made.   Tibia was subluxed forward and menisci removed.  Extramedullary tibial  alignment guide was placed referencing proximally at the medial aspect  of the tibial tubercle and distally along the second metatarsal axis and  tibial crest.  The block was pinned to remove about 2 mm off the  deficient medial side.  He was quite deficient medially.  Tibial  resection was made with an oscillating saw.  Size 5 was the most  appropriate tibial component and the proximal tibia  was prepared with  the modular drill and keel punch for a size 5.  Femoral preparation was  completed with the intercondylar cut.   Size 5 mobile bearing tibial trial, size 5 posterior stabilized femoral  trial and a 12.5 mm posterior stabilized rotating platform insert trial  are placed.  With the 12.5 full extension was achieved with a tiny bit  of hyperextension.  I went to a 15 which allowed for full extension with  excellent varus-valgus anterior-posterior balance throughout full range  of motion.  We needed a 15 because I had to cut extra tibia to get down  to the base of that defect.  Patella was then everted and thickness  measured to be 25 mm.  Freehand resection was taken to 13 mm, 41  template was placed, lug holes were drilled, trial patella was placed  and the trial tracks normally.  Osteophytes removed off the posterior  femur with the trial in place.  All trials are removed  and cut bone  surfaces prepared with pulsatile lavage.  Cement was mixed and once  ready for implantation a size 5 mobile bearing tibial tray, size 5  posterior stabilized femur and 41 patella are cemented into place.  The  patella was held with a clamp.  Trial 15 mm insert was placed, knee held  in full extension and all extruded cement removed.  When the cement had  fully hardened then the permanent 15 mm posterior stabilized rotating  platform insert was placed in the tibial tray.  The wound was copiously  irrigated with saline solution and FloSeal injected on the posterior  capsule, medial and lateral gutters and suprapatellar area.  Moist  sponge was placed and tourniquet released with a total time of 39  minutes.  Sponge was removed after 2 minutes.  There was a fair amount  of oozing.  I stopped what was possible with electrocautery but then we  decided to place a Hemovac drain.  Wound was copiously irrigated again  and then the arthrotomy closed over the drain with interrupted #1 PDS.  Flexion against gravity was 140 degrees.  Subcu was closed with  interrupted 2-0 Vicryl and subcuticular running 4-0 Monocryl.  The drain  was hooked to suction.  Incision cleaned and dried and Steri-Strips and  a bulky sterile dressing applied.  He was then placed into a knee  immobilizer, awakened and transported to recovery in stable condition.      Ollen Gross, M.D.  Electronically Signed     FA/MEDQ  D:  07/13/2008  T:  07/13/2008  Job:  [id number]

## 2011-01-08 NOTE — Consult Note (Signed)
NAMESAMAY, DELCARLO NO.:  [id number]   MEDICAL RECORD NO.:  [id number]          PATIENT TYPE:  INP   LOCATION:  1226                         FACILITY:  [facility]   PHYSICIAN:  Peter M. [place]azil[place], M.D.  DATE OF BIRTH:  1940-10-24   DATE OF CONSULTATION:  07/15/2008  DATE OF DISCHARGE:                                 CONSULTATION   HISTORY OF PRESENT ILLNESS:  Mr. Woolum is a 70 year old white male who  we were requested to see by Dr. Lequita Halt for evaluation of abnormal  cardiac enzymes.  The patient is a 71 year old male who was admitted on  July 13, 2008, for a left total knee replacement for severe  osteoarthritis.  Initial postoperative course was complicated by  hypotension.  Today, the patient developed a high fever up to 103.2,  associated with chills.  He has also developed acute renal failure.  During this course, serial cardiac enzymes were obtained.  Of note, the  patient has had no chest pain, shortness of breath or palpitations.  He  predominately complains of chills and then feeling hot.  He has had no  cough.  He denies any dysuria, but did initially postoperatively have  difficulty urinating.  Mr. Modesto has a known history of coronary  disease and had previous angioplasty of the ostium of the second  diagonal branch in 2003.  He had a normal stress Cardiolite study in  December 2009.   PAST MEDICAL HISTORY:  1. Severe osteoarthritis.  2. History of coronary artery disease as noted above.  3. History of hypertension.  4. History of metabolic syndrome with low HDL, elevated triglycerides      and obesity.  5. He has had previous cystoscopy for kidney stone in 1999.  6. He has had previous cholecystectomy.  7. He has a history of a hiatal hernia and had prior surgery for this      in 2000.  8. He has a history of gastroesophageal reflux disease.  9. History of prior appendectomy.   ALLERGIES:  MORPHINE CAUSES HALLUCINATIONS.   CURRENT  MEDICATIONS:  1. Ceftriaxone 1 gram daily.  2. Colace 100 mg b.i.d.  3. Doxazosin 8 mg per day.  4. Famvir 500 mg b.i.d.  5. Hydrochlorothiazide 25 mg per day.  6. Hydromorphone p.r.n.  7. Loratadine 10 mg per day.  8. Paxil 20 mg per day.  9. Ditropan 15 mg per day.   SOCIAL HISTORY:  The patient is a Naval architect.  He is married.  He does  have a history of heavy alcohol use.  He has no history of tobacco use.   FAMILY HISTORY:  His father died at age 31 due to myocardial infarction.  Mother died at age 64 with breast cancer.  Two brothers are alive and  well.  One sister died at age 13 due to an aortic aneurysm.   REVIEW OF SYSTEMS:  All other systems were reviewed and are negative.   PHYSICAL EXAMINATION:  GENERAL:  The patient is an obese white male,  balding.  He has a nasal CPAP in place.  HEENT:  Normocephalic, atraumatic.  His pupils are equal, round and  reactive.  Sclerae clear.  Oropharynx is clear.  NECK:  Supple without JVD, adenopathy, thyromegaly or bruits.  LUNGS:  Clear to auscultation and percussion.  CARDIAC:  Reveals a regular rate and rhythm without gallop, murmur, rub  or click.  ABDOMEN:  Obese, soft and nontender.  His bowel sounds are positive.  He  does have a Foley catheter in place.  He does have mild scrotal edema.  NEUROLOGIC:  He is alert.  He has demonstrated some confusion today but  is currently oriented x4 VITAL SIGNS:  Temperature 97.6, now, blood  pressure 142/66, pulse is 90 and regular, respirations are 20.  Oxygen  saturation is 96%.   LABORATORY DATA:  ECG showed normal sinus rhythm with mild nonspecific T-  wave abnormality.  There are no serial changes.  Arterial blood gas  showed a pH of 7.33, pCO2 of 57, pO2 of 64, bicarb 29.  White count  8700, hemoglobin 9.5, hematocrit 27.4, platelets 175,000.  BUN peaked at  41 with a creatinine of 3.43 on July 14, 2008.  Today, his BUN was  down to 25 and creatinine 1.59.  Sodium is 138,  potassium 4.8, chloride  103, CO2 of 30, glucose of 160.  Liver function studies are normal.  Albumin is 3.2, calcium is 8.8.  BNP level was 82.  Serial cardiac  enzymes showed a CK of 377 with 7.2 MB, followed by 318 with 4.0 MB,  followed by 265 with 2.6 MB, followed by 274 with 2.8 MB.  All relative  indexes were less than 2.  Troponins were 0.01, 0.01, 0.07 and then  0.16.  Chest x-ray was reviewed and demonstrated a moderate hiatal  hernia.  There is minimal atelectasis without acute change.  Renal  ultrasound showed no hydronephrosis.  Both kidneys appeared mildly  enlarged compared to a prior CT, suggesting possible acute inflammation.   IMPRESSION:  1. Abnormal cardiac enzymes.  I do not think this represents an acute      coronary syndrome.  The patient has had no cardiac symptoms.  His      ECG shows no acute changes.  His elevated total CPK with negative      relative index suggests the CPK is elevated due to his prior      hypotension, acute renal insufficiency and fever.  Initially      troponins were also negative and then have increased slightly.  I      think this is also due to his acute hypotensive episode and also      acute renal insufficiency since troponin is renally cleared.  2. Fever of unclear etiology.  3. Postoperative hypotension.  4. Status post left total knee replacement.  5. Remote percutaneous transluminal coronary angioplasty of the      diagonal branch.  6. Acute renal insufficiency, improving.   PLAN:  I would not change his cardiac therapy at this time.  The patient  is hemodynamically stable.  [facility] is following and  treating his acute febrile illnesses and acute renal insufficiency.  We  will defer further management to the medical team.           ______________________________  Peter M. [place], M.D.     PMJ/MEDQ  D:  07/15/2008  T:  07/16/2008  Job:  [id number]   cc:   Ollen Gross, M.D.  Fax: [phone]   Karie Schwalbe, MD  [address] [place], [place] [address]   Colleen Can. Deborah Chalk, M.D.  Fax: [phone]

## 2011-01-08 NOTE — Discharge Summary (Signed)
NAMEVARUN, [name]:  [id number]   MEDICAL RECORD NO.:  [id number]          PATIENT TYPE:  INP   LOCATION:  1612                         FACILITY:  [facility]   PHYSICIAN:  Ollen Gross, M.D.    DATE OF BIRTH:  08-04-1941   DATE OF ADMISSION:  07/13/2008  DATE OF DISCHARGE:  07/18/2008                               DISCHARGE SUMMARY   ADMITTING DIAGNOSES:  1. Bilateral knee osteoarthritis, left greater than right.  2. Hypertension.  3. Hypercholesterolemia.  4. Hiatal hernia.  5. Enlarged prostate.   DISCHARGE DIAGNOSES:  1. Osteoarthritis, left knee, status post left total knee replacement      and arthroplasty.  2. Acute blood loss anemia.  3. Postoperative acute renal failure, resolved.  4. Altered mental status, postoperative confusion, resolved.  5. Postoperative hypotension, resolved.  6. Hypertension.  7. Hypercholesterolemia.  8. Hiatal hernia.  9. Enlarged prostate.   PROCEDURE:  July 13, 2008, left total knee.  Surgeon Dr. Lequita Halt,  assistant Avel Peace PA-C.  Anesthesia was spinal with Duramorph.   CONSULTS:  1. Internal Medicine, Dr. Melvyn Novas.  2. Cardiology, Dr. Peter Swaziland.   BRIEF HISTORY:  Mr. [name] [name] is a 70 year old male with degenerative  arthritis of the left knee with progressive worsening pain and  dysfunction recalcitrant to management, now presents for total knee  arthroplasty.   LABORATORY DATA:  Blood gas taken on July 15, 2008:  pH low at  7.338, pCO2 of 57.4, pO2 of 64.5, bicarb 29.0, total CO2 27.4. CBC on  admission showed a hemoglobin of 14.1, hematocrit 41.3, white cell count  6.0, platelets 224.  Postop hemoglobin went down to 10, then 9.6, got as  low as 9.3, came back up.  Hemoglobin was 9.9 and 29.0 crit.  PT/PTT  preop 15.1 and 26 respectively, INR 1.22.  Serial pro-times followed per  Coumadin protocol.  Last PTT/INR 28.7 and 2.5.  Chem panel on admission  all within normal limits with exception of  total bili slightly elevated  at 1.4.  Serial B-mets were followed.  Electrolytes all remained within  normal limits, although the renal function on the serial B-mets showed  some elevation.  Creatinine went from 1.1 up quickly to 3.2, then 3.4,  came back down to 1.7, came back to normal level of 1.0.  BUN went from  20 to 35, got as high as 41, back down to 25, came back to a normal  level of 15.  Cardiac enzymes:  There were a total of 6 sets.  Set #1 on  July 14, 2008, CK elevated at 377, CK-MB elevated at 7.2, index  normal at 1.9, troponin normal at 0.01.  Second set taken on July 15, 2008, CK elevated at 318, CK-MB down to normal at 2.4, index normal  at 1.3, troponin normal at 0.01.  Third set on July 15, 2008, CK  down to 265 which was still elevated, CK-MB normal at 2.6, index normal  at 1.0, troponin went up thought to 0.07.  The fourth set taken on  July 15, 2008, CK elevated at 74,  CK-MB normal at 2.8, index normal  at 1.1, troponin went up to 0.16.  The fifth set taken on July 16, 2008, CK elevated at 257, CK-MB normal at 2.2, index normal at 0.5,  troponin went up further to 0.55.  The sixth and final set taken on  July 16, 2008, showed CK normal at 216, CK-MB normal at 2.3, index  normal at 1.1, and troponin down some to 0.13.  Urine sodium and  potassium of 37 and 14 respectively.  Preop UA negative.  Blood group  type A positive.  Blood cultures taken on July 15, 2008:  No growth  times 2.  Urine culture taken on July 15, 2008:  No growth.  Followup UA did show small leukocyte esterase, 3 to 6 white cells, 3 to  6 red cells.  EKG dated July 12, 2008:  Marked sinus bradycardia  with sinus arrhythmia, improved lateral T-wave since previous, confirmed  by Dr. Charlton Haws.  Followup EKG on July 15, 2008:  Sinus rhythm  with premature atrial complexes, increased rate from previous, confirmed  by Dr. Charlton Haws.   HOSPITAL COURSE:   The patient was admitted to [facility],  tolerated the procedure well, and later transferred to recovery room,  then orthopedic floor.  Started on PCA and p.o. medications for pain  control following surgery.  Unfortunately on day 1, the patient had low  urinary output.  Creatinine went up to 3.2, indicating acute renal  failure.  The patient was on ACE inhibitor.  Blood pressure was low with  a systolic in the 90s, so we gave him fluids for the low pressure and  also for the acute renal failure.  All of his cardiac meds were held on  parameters and only resumed when needed.  We got Internal Medicine to  evaluate.  The patient unfortunately developed some confusion due to the  hypotension and acute renal failure postop.  Cardiac enzymes were taken.  Initially the first 3 sets did show some elevation of the CK, although  MBs and indices were elevated and normal respectively, but came down as  the first 3 sets were taken.  EKG was also checked on July 15, 2008,  and that was compared to the previous one.   The patient did respond well to the fluids with the pressure improving  and the creatinine improved back down to 1.7 by postop day #2.  Hypotension was a little bit better and we had Cardiology come and  evaluate the patient due to the elevated cardiac enzymes.  He was seen  on that afternoon on July 15, 2008, did not feel that there was any  sign of acute coronary syndrome, no symptoms, and the EKG looked normal  compared to previous one.  Felt that most of the enzyme effects and with  troponin which did elevate further was felt to be due to the hypotension  and the acute renal failure.  This was deferred back to the medical  team, which assisted with his care.  Continued with the fluids and his  renal insufficiency and failure did improve with improvement of the BUN  and the creatinine.  The elevated troponin again was not likely acute  coronary syndrome per  Cardiology, so therefore this was followed and the  troponin did start trending downward by the sixth set.   Once he improved from his renal failure standpoint, his pressure  improved.  He was able to get up and work  with therapy.  He did have  some confusion during this initial episode, but had started to also  improve throughout the hospital course.  By July 16, 2008, his renal  failure had improved, enzymes had trended down and normalized, and the  troponin was improving.  Dressing changed on day 2 of his stay and  followed daily.  No signs of infection.  By July 16, 2008, he was  doing very well with this therapy, walking about 200 feet.  Cardiology  signed off.  He had been transferred to the monitor unit after the  episode initially.  He was transferred up to the orthopedic floor over  the weekend, where he continued to progress well with physical therapy,  walking about 150 to 250 feet by postop day 4.  He was seen back on  rounds the following day of postop day 5 on July 18, 2008.  His  creatinine was normal, BUN was normal.  His pressure was stabilized.  His confusion had resolved.  He had a little bit of fever on postop day  4, but that had resolved and he was afebrile.  He was tolerating his  meds and was discharged home later that day.   DISCHARGE PLAN:  1. The patient discharged home on July 18, 2008.  2. Discharge diagnoses--please see above.   DISCHARGE MEDICATIONS:  Percocet, Robaxin, Nu-Iron Coumadin.   DIET:  Low-sodium, heart-healthy diet.   FOLLOWUP:  The patient to follow up on Tuesday or Thursday, July 26, 2008, or July 28, 2008, with Dr. Lequita Halt.  Call the office for an  appointment.   DISPOSITION:  Home.   CONDITION ON DISCHARGE:  Improving.      Alexzandrew L. Perkins, P.A.C.      Ollen Gross, M.D.  Electronically Signed    ALP/MEDQ  D:  09/01/2008  T:  09/01/2008  Job:  [id number]   cc:   Penni Bombard, MD  Fax:  [phone]   Peter M. Swaziland, M.D.  Fax: [phone]

## 2011-01-11 ENCOUNTER — Other Ambulatory Visit: Payer: 59 | Admitting: Gastroenterology

## 2011-01-11 NOTE — H&P (Signed)
NAMEAUBERY, DOUTHAT NO.:  [id number]   MEDICAL RECORD NO.:  [id number]          PATIENT TYPE:  INP   LOCATION:  NA                           FACILITY:  [facility]   PHYSICIAN:  Ollen Gross, M.D.    DATE OF BIRTH:  06-22-1941   DATE OF ADMISSION:  07/13/2008  DATE OF DISCHARGE:                              HISTORY & PHYSICAL   CHIEF COMPLAINT:  Bilateral knee pain left greater than right.   HISTORY OF PRESENT ILLNESS:  The patient is a 70 year old male who is  seen by Dr. Lequita Halt for ongoing complaints of bilateral knee pain.  He  was seen as a second opinion.  He had been treated by Dr. Cleophas Dunker in  the past and was told he had arthritis in his knees.  He has had  temporary relief with cortisone injections in the past but continues to  have problems.  He has been seen by Dr. Lequita Halt and found to have  significant arthritic changes, especially bone-on-bone in medial  compartments of each knee.  There is also some patellar patellofemoral  involvement bilaterally.  It is felt he has reached the point where he  would benefit from undergoing replacements.  Risks and has been  discussed.  He elects to proceed with surgery.  He will start off with  the left knee.   ALLERGIES:  No known drug allergies.   INTOLERANCES:  MORPHINE causes hallucinations.   CURRENT MEDICATIONS:  Benazepril, doxazosin, Famvir, hydralazine,  hydrocodone, Hyzaar, Lasix, Norvasc, paroxetine, pravastatin,  oxybutynin, Cialis, Aleve, Spectro-Vite, multivitamin, Mega Red krill  oil.   PAST MEDICAL HISTORY:  Hypertension, hypercholesterolemia, hiatal  hernia, enlarged prostate.   PAST SURGICAL HISTORY:  Bilateral knee surgeries, hernia repair, hiatal  hernia surgery, and appendectomy.   FAMILY HISTORY:  Father deceased age 65 with MI.  Mother deceased age 68  with breast cancer and bone metastasis.   SOCIAL HISTORY:  Married, works as a Naval architect.  Past smoker.  He  smoked for about 30  years but quit about 20 years ago.  Only social  intake of alcohol on the weekends.  Has 2 children.  He does have  caregiver lined up after surgery.  He has approximately 2 to 3 steps  entering his home.   REVIEW OF SYSTEMS:  GENERAL:  No fevers, chills, night sweats.  NEURO:  No seizures or paralysis.  RESPIRATORY:  No shortness breath, productive  cough or hemoptysis.  CARDIOVASCULAR:  No chest pain, angina, orthopnea.  GI:  No nausea, vomiting, diarrhea, constipation.  GU: No dysuria or  hematuria.  No discharge.  MUSCULOSKELETAL:  Knee pain.   PHYSICAL EXAMINATION:  VITAL SIGNS:  Pulse 56, respirations 12, blood  pressure 120/68.  GENERAL:  70 year old white male well nourished, well developed,  overweight, large frame, alert and cooperative.  HEENT:  Normocephalic, atraumatic.  Pupils are reactive.  EOMs intact.  NECK:  Supple.  CHEST:  He has a barrel-chested body habitus.  LUNGS:  Clear anterior posterior chest wall.  HEART:  Regular rate and rhythm with a grade 2/6 to 3/6 systolic  ejection murmur best heard over aortic point and pulmonic point.  He has  occasional ectopic beat.  ABDOMEN:  Soft, round.  Bowel sounds present.  RECTAL, BREASTS, GENITALIA:  Not done not per to present illness.  EXTREMITIES:  Left knee no effusion, marked crepitus is noted.  No  instability.  Range of motion 5-115.  Right knee no effusion, marked  crepitus noted.  Range of motion also 5-115.   IMPRESSION:  Osteoarthritis bilateral knees, left greater than right.   PLAN:  The patient will be admitted to [facility] to undergo a  left total knee replacement arthroplasty.  Surgery will be performed by  Dr. Ollen Gross.  He had been seen preoperative by Dr. Alphonsus Sias and  referred over to Dr. Deborah Chalk.  Dr. Deborah Chalk evaluated the patient and  cleared him to undergo one surgery at a time but not bilaterally.  The  patient subsequently admitted to the hospital for the left total knee   arthroplasty.      Alexzandrew L. Perkins, P.A.C.      Ollen Gross, M.D.  Electronically Signed    ALP/MEDQ  D:  07/10/2008  T:  07/11/2008  Job:  [id number]   cc:   Colleen Can. Deborah Chalk, M.D.  Fax: [phone]   Karie Schwalbe, MD  [address] [place], [place] [address]

## 2011-01-11 NOTE — Discharge Summary (Signed)
[place]. [facility]  Patient:    [name] [name], [name] [name] Visit Number: [id number] MRN: [id number]          Service Type: CAT Location: 6500 [id number] Attending Physician:  Eleanora Neighbor Dictated by:   Jennet Maduro Earl Gala, R.N., A.N.P. Admit Date:  10/09/2001 Disc. Date: 10/10/01   CC:         [name] [name], M.D.   Discharge Summary  PRIMARY DISCHARGE DIAGNOSES:  Left-sided chest discomfort with subsequent elective cardiac catheterization and subsequent angioplasty to the second diagonal with a 2.0 x 10 mm cutting balloon.  SECONDARY DISCHARGE DIAGNOSES: 1. 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 2. Hypertension.  HISTORY OF PRESENT ILLNESS:  [name] [name] is a pleasant 70 year old white male who has had evaluation for left-sided chest discomfort and he was seen earlier this month in our office and referred on for cardiac catheterization.  LABORATORY DATA ON ADMISSION:  Total cholesterol was 155, triglycerides were 329, HDL 35, and LDL of 54.  PT and PTT were unremarkable.  Chemistry studies were all within normal limits, as well as CBC.  HOSPITAL COURSE:  The patient was admitted electively from the short stay center in order to undergo coronary angiography.  The procedure was tolerated well without any known complications.  LV function was noted to be normal. The left main had minor irregularities.  The LAD had 30-40 percent proximal narrowing with a 30 percent narrowing in the first diagonal.  The second diagonal, although small, had a 90 percent focal narrowing and angioplasty with a subsequent 2.0 x 10 mm cutting balloon was performed with a satisfactory result obtained.  There were irregularities also noted of the left circumflex and right coronary artery.  Postprocedure, he was transferred to transitional care unit and plans will now be made for him to be  discharged if deemed stable on a.m. rounds.  DISCHARGE CONDITION:  Improved.  DISCHARGE MEDICATIONS: 1. He will resume all of his previous home medicines which included    hydrochlorothiazide 25 mg q.d. 2. Clarinex daily. 3. Norvasc 5 mg q.d. 4. Baby aspirin q.d. 5. Cardura 4 mg q.d. 6. Will add Tricor 160 mg q.d. for cholesterol management. 7. Will give him a p.r.n. prescription for nitroglycerin. 8. Will also place him on Plavix 75 mg q.d. for the next 21 days to take    along with his aspirin.  ACTIVITY:  Light over the next 3-4 days.  We will have him call our office for any problems and otherwise plan to see him in approximately 10 days and he is asked to call office to schedule that appointment. Dictated by:   Jennet Maduro Earl Gala, R.N., A.N.P. Attending Physician:  Eleanora Neighbor DD:  10/09/01 TD:  10/09/01 Job: [id number]

## 2011-01-11 NOTE — H&P (Signed)
[place]. [facility]  Patient:    [name] [name], [name] [name] Visit Number: [id number] MRN: [id number]          Service Type: CAT Location: 6500 [id number] Attending Physician:  Eleanora Neighbor Dictated by:   Jennet Maduro Earl Gala, R.N., A.N.P. Admit Date:  10/09/2001   CC:         Elvina Sidle, M.D.                         History and Physical  CHIEF COMPLAINT:  Left chest discomfort.  HISTORY OF PRESENT ILLNESS:  Mr. [name] [name] is a very pleasant 70 year old white male who was seen in our office earlier this month with complaints of left-sided chest pain that has been described as a harsh discomfort.  It has been occurring over the past several months but has been more progressive over the previous four weeks.  It tends to come at atypical times and will come at times of stress or if he is significantly fatigued.  If he does heavy work he really will not have the discomfort while he is working but will notice that after that, he begins to have discomfort.  He has not had any significant shortness of breath and he has had no other associated symptoms.  He was subsequently seen in the office and referred on for elective coronary angiography.  PAST MEDICAL HISTORY: 1. Cystoscopy for kidney stone in 1999. 2. History of cholecystectomy in 1996. 3. Hiatal hernia surgery in 2000. 4. Previous gastroesophageal reflux disease. 5. History of appendectomy in 1952.  ALLERGIES:  None.  CURRENT MEDICATIONS: 1. Norvasc 5 mg a day. 2. Baby aspirin daily. 3. Cardura 4 mg a day. 4. Clarinex. 5. Hydrochlorothiazide 25 mg a day.  FAMILY HISTORY:  Father died at 38 due to a myocardial infarction.  Mother died at 72 with breast cancer.  Two brothers are living.  He has had one sister who died at the age of 104 due to apparent aortic aneurysm.  SOCIAL HISTORY:  He is a Naval architect, he is married.  He enjoys riding motorcycles.  He does tend to drink heavily on the weekends but  does not drink during the week.  He has had no tobacco use for the past 12 years.  REVIEW OF SYSTEMS:  Is basically as noted above.  He has gained at least 20 pounds over the past year.  He has a generalized arthritis in his hands and knees but overall feels well, and otherwise review of systems is negative.  PHYSICAL EXAMINATION:  GENERAL:  He is a pleasant 70 year old white male.  He is in no acute distress.  VITAL SIGNS:  Blood pressure 160/90 sitting, 170/100 standing.  HEENT:  Unremarkable.  He does have one earring and does have a tattoo.  NECK:  Supple, no bruits.  LUNGS:  Clear.  CARDIAC:  Shows a regular rhythm.  There was no chest wall tenderness.  ABDOMEN:  Obese without masses and nontender.  EXTREMITIES:  Without edema.  LABORATORY DATA:  A 12-lead electrocardiogram showed normal sinus bradycardia.  Previous chest x-ray performed at Dr. Tommi Rumps office showed a questionable abnormality, with repeat exam report pending.  OVERALL IMPRESSION: 1. Chest discomfort. 2. Hypertension.  PLAN:  Will proceed on with elective coronary angiography.  The risks, procedure, and benefits have all been explained and he is willing to proceed.Dictated by:   Jennet Maduro Earl Gala, R.N., A.N.P. Attending Physician:  Eleanora Neighbor  DD:  10/09/01 TD:  10/09/01 Job: [id number]

## 2011-01-11 NOTE — Cardiovascular Report (Signed)
[place]. [facility]  Patient:    [name] [name], [name] [name] Visit Number: [id number] MRN: [id number]          Service Type: CAT Location: 6500 6522 01 Attending Physician:  Eleanora Neighbor Dictated by:   Colleen Can. Deborah Chalk, M.D. Proc. Date: 10/09/01 Admit Date:  10/09/2001   CC:         Elvina Sidle, M.D.   Cardiac Catheterization  DATE OF BIRTH:  Jan 25, 1941  HISTORY:  [name] [name] is referred for evaluation of recurrent left-sided chest pain that has been atypical for angina, produced by stress and exertion.  PROCEDURE:  Left heart catheterization with selective coronary angiography and left ventricular angiography with angioplasty of a second diagonal coronary.  CARDIOLOGIST:  Colleen Can. Deborah Chalk, M.D.  TYPE AND SITE OF ENTRY:  Percutaneous right femoral artery.  CATHETERS:  6-French 4 curved Judkins right and left coronary catheters, 6-French pigtail ventricular guiding catheter, 7-French JL4 guide catheter, high-torque floppy guidewire, and a 2.0 x 10 mm cutting balloon.  CONTRAST MATERIAL:  Omnipaque.  MEDICATIONS GIVEN PRIOR TO PROCEDURE:  Valium 10 mg p.o.  MEDICATIONS GIVEN DURING PROCEDURE:  Versed 5 mg IV, IV nitroglycerin, Integrilin, and heparin.  COMMENTS:  The patient tolerated the procedure well.  HEMODYNAMIC DATA:  The aortic pressure was 170/82.  LV was 170/10-19.  There was no aortic valve gradient noted on pullback.  ANGIOGRAPHIC DATA:  CORONARY ARTERIES: 1. Left main coronary artery has irregularity but is otherwise normal. 2. Left anterior descending: The left anterior descending has 30 to 40%    narrowings proximally.  There is a first diagonal vessel of moderate    size which has 30 to 50% narrowing segmentally and proximally.  There is    a second diagonal vessel that is relatively small but has a 90% ostial    stenosis.  The left anterior descending itself extends to the apex. 3. Left circumflex:  The left  circumflex is moderate size and continues as    a moderately large obtuse marginal.  There are irregularities in this    vessel.  There is no significant focal narrowing. 4. Right coronary artery.  The right coronary artery is a dominant vessel.    It is normal.  LEFT VENTRICULAR ANGIOGRAM:  The left ventricular angiogram was performed in the RAO position.  Overall cardiac size and silhouette were normal.  Left ventricular function is normal.  ANGIOPLASTY PROCEDURE:  We turned our attention to the second diagonal vessel. Although it was small, it had a severe ostial stenosis.  The guiding catheter was a JL4 guide.  High-torque floppy guidewire required a secondary bend in order to get it to traverse into the left anterior descending.  There was no difficulty in getting it to find the second diagonal vessel.  The 2.0 x 10 mm cutting balloon was positioned with difficulty across the stenosis.  After the initial balloon inflation, however, we were able to easily pass the catheter back and forth across the stenosis.  The balloon was inflated on four different occasions to maximum of 12 atmospheres.  The final angiographic result was felt to be excellent with a less than 20% residual stenosis. Overall, the patient tolerated the procedure well.  OVERALL IMPRESSION: 1. Normal left ventricular function. 2. 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 3. Successful angioplasty of the second diagonal. Dictated by:   Colleen Can. Deborah Chalk, M.D. Attending Physician:  Eleanora Neighbor DD:  10/09/01 TD:  10/09/01 Job: [id number]

## 2011-01-18 ENCOUNTER — Ambulatory Visit: Payer: 59 | Admitting: Gastroenterology

## 2011-02-04 ENCOUNTER — Other Ambulatory Visit: Payer: Self-pay | Admitting: Internal Medicine

## 2011-02-12 ENCOUNTER — Encounter: Payer: Self-pay | Admitting: Gastroenterology

## 2011-02-12 ENCOUNTER — Ambulatory Visit: Payer: 59 | Admitting: Gastroenterology

## 2011-02-15 ENCOUNTER — Ambulatory Visit (INDEPENDENT_AMBULATORY_CARE_PROVIDER_SITE_OTHER): Payer: 59 | Admitting: Family Medicine

## 2011-02-15 DIAGNOSIS — Z23 Encounter for immunization: Secondary | ICD-10-CM

## 2011-02-15 DIAGNOSIS — Z2911 Encounter for prophylactic immunotherapy for respiratory syncytial virus (RSV): Secondary | ICD-10-CM

## 2011-02-15 NOTE — Progress Notes (Signed)
  Subjective:    Patient ID: [name] [name], male    DOB: November 02, 1940, 70 y.o.   MRN: [id number]  HPI    Review of Systems     Objective:   Physical Exam        Assessment & Plan:  rn visit.

## 2011-03-08 ENCOUNTER — Encounter: Payer: Self-pay | Admitting: Internal Medicine

## 2011-03-08 ENCOUNTER — Ambulatory Visit (INDEPENDENT_AMBULATORY_CARE_PROVIDER_SITE_OTHER): Payer: 59 | Admitting: Internal Medicine

## 2011-03-08 DIAGNOSIS — I10 Essential (primary) hypertension: Secondary | ICD-10-CM

## 2011-03-08 DIAGNOSIS — I4891 Unspecified atrial fibrillation: Secondary | ICD-10-CM

## 2011-03-08 DIAGNOSIS — I495 Sick sinus syndrome: Secondary | ICD-10-CM

## 2011-03-08 DIAGNOSIS — I498 Other specified cardiac arrhythmias: Secondary | ICD-10-CM

## 2011-03-08 DIAGNOSIS — G4730 Sleep apnea, unspecified: Secondary | ICD-10-CM

## 2011-03-08 DIAGNOSIS — I2510 Atherosclerotic heart disease of native coronary artery without angina pectoris: Secondary | ICD-10-CM

## 2011-03-08 NOTE — Assessment & Plan Note (Signed)
Stable but slightly elevated Salt restriction and ETOH avoidance advised.

## 2011-03-08 NOTE — Assessment & Plan Note (Signed)
CPAP encouraged 

## 2011-03-08 NOTE — Assessment & Plan Note (Signed)
No symptoms of afib By pacemaker, he appears to have afib 11.9% of the time.  As he is asymptomatic, we will continue rate control and anticoagulation with pradaxa.

## 2011-03-08 NOTE — Assessment & Plan Note (Signed)
Normal pacemaker function See Pace Art report No changes today  

## 2011-03-08 NOTE — Assessment & Plan Note (Signed)
No ischemic symptoms I have encouraged him to keep sl NTG on hand, however he declines. No changes.

## 2011-03-08 NOTE — Progress Notes (Signed)
The patient presents today for routine electrophysiology followup.  Since last being seen in our clinic, the patient reports doing very well.  He reports no symptoms of afib.  He remains quite active.  Unfortunately, he continues to drink ETOH moderately.  Today, he denies symptoms of palpitations, chest pain, shortness of breath, orthopnea, PND, lower extremity edema, dizziness, presyncope, syncope, or neurologic sequela.  The patient feels that he is tolerating medications without difficulties and is otherwise without complaint today.   Past Medical History  Diagnosis Date  . CAD (coronary artery disease)     s/p PCI 2003. Last stress test in Feb 2011 was normal  . Paroxysmal atrial fibrillation     on Multaq and Pradaxa  . HTN (hypertension)   . ETOH abuse   . DJD (degenerative joint disease)     s/p Left TKA 06/2008  . Metabolic syndrome   . Hematuria   . Psychosexual dysfunction with inhibited sexual excitement   . Herpes zoster without mention of complication   . Hypertrophy of prostate without urinary obstruction and other lower urinary tract symptoms (LUTS)   . Anxiety state, unspecified   . Allergic rhinitis, cause unspecified   . OSA on CPAP     compliant with CPAP  . Obesity   . Hyperlipidemia   . GERD (gastroesophageal reflux disease)   . Sick sinus syndrome     s/p PPM by Dr Deborah Chalk  . Atrial flutter     s/p atrial flutter CTI ablation 10/26/10  . Hiatal hernia    Past Surgical History  Procedure Date  . Esophagogastroduodenoscopy 1992    Dr. Doreatha Martin  . Hiatal hernia repair 04/1998  . Balloon angioplasty, artery 2003    Ostium of the 2nd DX  . Cholecystectomy 1999    Dr. Luan Pulling  . Basal cell carcinoma excision 12/2003    On back  . Total knee arthroplasty 06/2008    Dr. Despina Hick  . Pacemaker insertion 10/2009    [employer]  . Atrial flutter ablation 10/26/10    Current Outpatient Prescriptions  Medication Sig Dispense Refill  . amLODipine (NORVASC) 5 MG tablet  Take 5 mg by mouth 2 (two) times daily.        . benazepril (LOTENSIN) 40 MG tablet Take 40 mg by mouth 2 (two) times daily.        . colchicine 0.6 MG tablet Take 0.6 mg by mouth as needed.        . cycloSPORINE (RESTASIS) 0.05 % ophthalmic emulsion 1 drop as directed.        . doxazosin (CARDURA) 8 MG tablet Take 4 mg by mouth at bedtime.       . famciclovir (FAMVIR) 500 MG tablet Take 1 tablet (500 mg total) by mouth 2 (two) times daily. For 5 days then stop  30 tablet  3  . finasteride (PROSCAR) 5 MG tablet Take 5 mg by mouth daily.        . fish oil-omega-3 fatty acids 1000 MG capsule Take 2 g by mouth as directed.        Marland Kitchen FLUoxetine (PROZAC) 20 MG capsule Take 20 mg by mouth daily.        . hydrochlorothiazide 25 MG tablet TAKE 1 TABLET BY MOUTH EVERY DAY  90 tablet  3  . Multiple Vitamin (MULTIVITAMIN PO) Take by mouth.        . oxybutynin (DITROPAN XL) 15 MG 24 hr tablet Take 15 mg by mouth daily.        Marland Kitchen  PRADAXA 150 MG CAPS ONE BY MOUTH TWO TIMES A DAY  60 capsule  6  . pravastatin (PRAVACHOL) 40 MG tablet Take 40 mg by mouth daily.        Marland Kitchen oxyCODONE-acetaminophen (PERCOCET) 5-325 MG per tablet Take 1-2 tablets by mouth every 6 (six) hours as needed for pain. Take 1-2 tablets every six hours as needed for pain  40 tablet  0    Allergies  Allergen Reactions  . Escitalopram Oxalate     REACTION: no ejaculation  . Morphine Sulfate     REACTION: unspecified    History   Social History  . Marital Status: Married    Spouse Name: N/A    Number of Children: 2  . Years of Education: N/A   Occupational History  . TRUCK DRIVER Counselling psychologist   Social History Main Topics  . Smoking status: Former Smoker    Quit date: 08/26/1990  . Smokeless tobacco: Not on file  . Alcohol Use: Yes     moderate ETOH use  . Drug Use: No  . Sexually Active: Not on file   Other Topics Concern  . Not on file   Social History Narrative  . No narrative on file    Family  History  Problem Relation Age of Onset  . Breast cancer Mother   . Bone cancer Mother   . Dementia Mother   . Breast cancer Mother     metastatic   . Heart attack Father   . Hypertension Father   . Aneurysm Sister     ROS-  All systems are reviewed and are negative except as outlined in the HPI above  Physical Exam: Filed Vitals:   03/08/11 0952  BP: 142/80  Pulse: 65  Height: 6' (1.829 m)  Weight: 292 lb 12.8 oz (132.813 kg)    GEN- The patient is well appearing, alert and oriented x 3 today.   Head- normocephalic, atraumatic Eyes-  Sclera clear, conjunctiva pink Ears- hearing intact Oropharynx- clear Neck- supple, no JVP Lymph- no cervical lymphadenopathy Lungs- Clear to ausculation bilaterally, normal work of breathing Chest- pacemaker pocket is well healed Heart- Regular rate and rhythm, no murmurs, rubs or gallops, PMI not laterally displaced GI- soft, NT, ND, + BS Extremities- no clubbing, cyanosis, or edema MS- no significant deformity or atrophy Skin- no rash or lesion Psych- euthymic mood, full affect Neuro- strength and sensation are intact  Pacemaker interrogation- reviewed in detail today,  See PACEART report  Assessment and Plan:

## 2011-03-08 NOTE — Patient Instructions (Signed)
Your physician wants you to follow-up in: 12 months with Dr. Taylor. You will receive a reminder letter in the mail two months in advance. If you don't receive a letter, please call our office to schedule the follow-up appointment.    

## 2011-03-17 ENCOUNTER — Other Ambulatory Visit: Payer: Self-pay | Admitting: Internal Medicine

## 2011-03-19 ENCOUNTER — Ambulatory Visit (INDEPENDENT_AMBULATORY_CARE_PROVIDER_SITE_OTHER): Payer: 59 | Admitting: Gastroenterology

## 2011-03-19 ENCOUNTER — Encounter: Payer: Self-pay | Admitting: Gastroenterology

## 2011-03-19 VITALS — BP 136/74 | HR 64 | Ht 72.0 in | Wt 290.0 lb

## 2011-03-19 DIAGNOSIS — Z7901 Long term (current) use of anticoagulants: Secondary | ICD-10-CM

## 2011-03-19 DIAGNOSIS — Z9889 Other specified postprocedural states: Secondary | ICD-10-CM

## 2011-03-19 DIAGNOSIS — G4730 Sleep apnea, unspecified: Secondary | ICD-10-CM

## 2011-03-19 DIAGNOSIS — I2510 Atherosclerotic heart disease of native coronary artery without angina pectoris: Secondary | ICD-10-CM

## 2011-03-19 DIAGNOSIS — R195 Other fecal abnormalities: Secondary | ICD-10-CM

## 2011-03-19 DIAGNOSIS — Z955 Presence of coronary angioplasty implant and graft: Secondary | ICD-10-CM

## 2011-03-19 DIAGNOSIS — G4733 Obstructive sleep apnea (adult) (pediatric): Secondary | ICD-10-CM | POA: Insufficient documentation

## 2011-03-19 DIAGNOSIS — Z9861 Coronary angioplasty status: Secondary | ICD-10-CM

## 2011-03-19 MED ORDER — PEG-KCL-NACL-NASULF-NA ASC-C 100 G PO SOLR: 1.0000 | Freq: Once | ORAL | Status: DC

## 2011-03-19 NOTE — Progress Notes (Signed)
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 The patient has absolutely no GI complaints, especially no melena, hematochezia, dyspepsia or abdominal pain. He is status post fundoplication by Dr.Gerkin per chronic GERD, is currently asymptomatic. His appetite is good and his weight is stable. Family history is noncontributory. The patient does have coronary artery disease and is on aspirin and Robaxin criteria and Pradaxa per Dr. Tillie Rung in cardiology. Review of labs shows no evidence of anemia or iron deficiency. He quit smoking 20 years ago, uses ethanol socially, and denies NSAID use.  I have reviewed this patient's present history, medical and surgical past history, allergies and medications.   Past Medical History  Diagnosis Date  . CAD (coronary artery disease)     s/p PCI 2003. Last stress test in Feb 2011 was normal  . Paroxysmal atrial fibrillation     on Multaq and Pradaxa  . HTN (hypertension)   . ETOH abuse   . DJD (degenerative joint disease)     s/p Left TKA 06/2008  . Metabolic syndrome   . Hematuria   . Psychosexual dysfunction with inhibited sexual excitement   . Herpes zoster without mention of complication   . Hypertrophy of prostate without urinary obstruction and other lower urinary tract symptoms (LUTS)   . Anxiety state, unspecified   . Allergic rhinitis, cause unspecified   . OSA on CPAP     compliant with CPAP  . Obesity   . Hyperlipidemia   . GERD (gastroesophageal reflux disease)   . Sick sinus syndrome     s/p PPM by Dr Deborah Chalk  . Atrial flutter     s/p atrial flutter CTI ablation 10/26/10  . Hiatal hernia    Past Surgical History  Procedure Date  . Esophagogastroduodenoscopy 1992    Dr. Doreatha Martin  . Hiatal hernia repair 04/1998  . Balloon angioplasty, artery 2003    Ostium of the 2nd DX  . Cholecystectomy 1999    Dr.  Luan Pulling  . Basal cell carcinoma excision 12/2003    On back  . Total knee arthroplasty 06/2008    Dr. Despina Hick  . Pacemaker insertion 10/2009    [employer]  . Atrial flutter ablation 10/26/10  . Appendectomy     reports that he quit smoking about 20 years ago. He has never used smokeless tobacco. He reports that he drinks alcohol. He reports that he does not use illicit drugs. family history includes Aneurysm in his sister; Bone cancer in his mother; Breast cancer in his mother; Dementia in his mother; Heart attack in his father; and Hypertension in his father.  There is no history of Colon cancer. Allergies  Allergen Reactions  . Escitalopram Oxalate     REACTION: no ejaculation  . Morphine Sulfate     REACTION: unspecified       ROS: The remainder of the 10 point ROS is negative.. no angina, palpitations, shortness of breath, or other cardiovascular symptoms. He does have sleep apnea and uses a CPAP machine. Previous surgeries have included appendectomy and cholecystectomy. The patient has had previous defibrillation and atrial ablation. Cardiac problems in addition to coronary artery disease include hyperlipidemia, and hypertension. He has had angioplasty and previous stent placement several years ago.     Physical Exam: General well developed well nourished patient in no acute distress, appearing his stated age Eyes PERRLA, no icterus, fundoscopic exam  per opthamologist Skin no lesions noted Neck supple, no adenopathy, no thyroid enlargement, no tenderness Chest clear to percussion and auscultation Heart no significant murmurs, gallops or rubs noted Abdomen no hepatosplenomegaly masses or tenderness, BS normal. c negative. Extremities no acute joint lesions, edema, phlebitis or evidence of cellulitis. Neurologic patient oriented x 3, cranial nerves intact, no focal neurologic deficits noted. Psychological mental status normal and normal affect.  Assessment and plan: Rule out  colonic polyposis. I actually cannot find previous colonoscopy report in his record, and if this was done, and spent over 10 years ago. He appears stable from a cardiovascular and pulmonary standpoint. I have scheduled colonoscopy, and we'll hold his Pradaxa for 5 days before this procedure unless otherwise advised by cardiology or primary care. His GERD has been alleviated by previous fundoplication, and is not on PPI therapy.  Please copy her primary care physician, referring physician, and pertinent subspecialists.Marland KitchenMarland KitchenDrs. Alred and [employer].  Encounter Diagnosis  Name Primary?  . Heme positive stool Yes

## 2011-03-19 NOTE — Patient Instructions (Signed)
Your procedure has been scheduled for 04/26/2011, please follow the seperate instructions.  Your prescription(s) have been sent to you pharmacy.

## 2011-04-15 ENCOUNTER — Telehealth: Payer: Self-pay | Admitting: *Deleted

## 2011-04-15 NOTE — Telephone Encounter (Signed)
Dr Jarold Motto is 2 days enough to have Colonoscopy??

## 2011-04-15 NOTE — Telephone Encounter (Signed)
Dr Johney Frame, please advise

## 2011-04-15 NOTE — Telephone Encounter (Signed)
WE DO 5 DAYS.COPY DR, Tillie Rung.Marland KitchenMarland Kitchen

## 2011-04-15 NOTE — Telephone Encounter (Signed)
Message copied by Leonette Monarch on Mon Apr 15, 2011  2:42 PM ------      Message from: Hillis Range      Created: Mon Apr 15, 2011  1:31 PM       OK to hold pradaxa for 2 days prior to the procedure and resume 24 hours after the procedure.      ----- Message -----         From: Harlow Mares, CMA         Sent: 04/12/2011   4:19 PM           To: Gardiner Rhyme, MD            Sent you a letter about holding patients Pradaxa for colonoscopy on 04/26/2011, please advise.                  Harlow Mares, CMA(AAMA)

## 2011-04-15 NOTE — Telephone Encounter (Signed)
5 days would be appropriate for coumadin but would exceed the recommended period for pradaxa. I would not advise holding pradaxa more than 3 days for the procedure (pharmacokinetics are more like lovenox than coumadin).

## 2011-04-16 NOTE — Telephone Encounter (Signed)
Last day of Pradaxa should be 9/2.

## 2011-04-16 NOTE — Telephone Encounter (Signed)
Ok...good to know....pass this on.Marland KitchenMarland Kitchen

## 2011-04-18 NOTE — Telephone Encounter (Signed)
Left message to call back about holding meds.

## 2011-04-19 NOTE — Telephone Encounter (Signed)
lmom to call back again about holding meds for procedure.

## 2011-04-19 NOTE — Telephone Encounter (Signed)
Left message to call back about holding meds.  

## 2011-04-22 NOTE — Telephone Encounter (Signed)
Pt aware as to when to stop Pradaxa and do hs prep with the new times.

## 2011-04-26 ENCOUNTER — Other Ambulatory Visit: Payer: 59 | Admitting: Gastroenterology

## 2011-05-01 ENCOUNTER — Ambulatory Visit (AMBULATORY_SURGERY_CENTER): Payer: 59 | Admitting: Gastroenterology

## 2011-05-01 ENCOUNTER — Encounter: Payer: Self-pay | Admitting: Gastroenterology

## 2011-05-01 DIAGNOSIS — R195 Other fecal abnormalities: Secondary | ICD-10-CM

## 2011-05-01 DIAGNOSIS — K5730 Diverticulosis of large intestine without perforation or abscess without bleeding: Secondary | ICD-10-CM

## 2011-05-01 MED ORDER — SODIUM CHLORIDE 0.9 % IV SOLN: 500.0000 mL | INTRAVENOUS | Status: DC

## 2011-05-01 NOTE — Patient Instructions (Signed)
FOLLOW DISCHARGE INSTRUCTIONS (BLUE & GREEN SHEETS)   INFORMATION ON DIVERTICULOSIS & HIGH FIBER DIET GIVEN TO YOU  CHECK STOOL FOR OCCULT BLOOD AGAIN IN ONE MONTH, KIT GIVEN TO YOU

## 2011-05-02 ENCOUNTER — Telehealth: Payer: Self-pay | Admitting: *Deleted

## 2011-05-02 NOTE — Telephone Encounter (Signed)
No answer

## 2011-05-02 NOTE — Telephone Encounter (Signed)
No ID on voice mail.  No message left at this time. 

## 2011-05-06 ENCOUNTER — Encounter: Payer: Self-pay | Admitting: Family Medicine

## 2011-05-06 ENCOUNTER — Ambulatory Visit (INDEPENDENT_AMBULATORY_CARE_PROVIDER_SITE_OTHER): Payer: 59 | Admitting: Family Medicine

## 2011-05-06 DIAGNOSIS — M109 Gout, unspecified: Secondary | ICD-10-CM

## 2011-05-06 DIAGNOSIS — R195 Other fecal abnormalities: Secondary | ICD-10-CM

## 2011-05-06 DIAGNOSIS — R21 Rash and other nonspecific skin eruption: Secondary | ICD-10-CM | POA: Insufficient documentation

## 2011-05-06 MED ORDER — OXYCODONE-ACETAMINOPHEN 5-325 MG PO TABS: 1.0000 | ORAL_TABLET | Freq: Four times a day (QID) | ORAL | Status: DC | PRN

## 2011-05-06 MED ORDER — PREDNISONE (PAK) 10 MG PO TABS: 10.0000 mg | ORAL_TABLET | Freq: Every day | ORAL | Status: AC

## 2011-05-06 NOTE — Assessment & Plan Note (Signed)
Per GI.  Await repeat IFOB.

## 2011-05-06 NOTE — Assessment & Plan Note (Signed)
Likely contact derm, d/w pt about steroid use, take with food. He is borderline with glucose prev, goal to limit hyperglycemia. He agrees.  I don't think tick bites are related.  If any sx of tick fever, then notify the clinic.  He agrees.

## 2011-05-06 NOTE — Patient Instructions (Signed)
I would take the prednisone as follows- 3 a day for 3 days, 2 a day for 3 days, 1 a day for 3 days.  Take with food.  You can take zyrtec/cetirizine 10mg  a day for itching.  Take melatonin at night for sleep.  If you have new rash or a fever, then notify the clinic (due to the tick bites) Take care.

## 2011-05-06 NOTE — Progress Notes (Signed)
Gout: R 2nd finger, L 2nd/3rd finger, R big toe with occ pain. Will take percocet rarely, unless there is a flare.  He has had sig pain with prev flares.    H/o blood in stool. Colonoscopy w/o polyps, pt has IFOB kit to repeat in 1 month.    Rash.  Itches, diffuse rash.  Recently out in the woods, this past weekend.  No FCNAV.  He is prediabetic.   Meds, vitals, and allergies reviewed.   ROS: See HPI.  Otherwise, noncontributory.  nad Diffuse lesions on the trunk and ext x4- blanching papular red lesions, some with vesicles 3 ticks pulled off during the exam, none engorged.   Chronic changes to the R 2nd finger, L 2nd/3rd finger noted.

## 2011-05-06 NOTE — Assessment & Plan Note (Signed)
Rx for percocet used, for flare.  No active flare now.

## 2011-05-18 ENCOUNTER — Telehealth: Payer: Self-pay

## 2011-05-18 ENCOUNTER — Other Ambulatory Visit: Payer: Self-pay | Admitting: Family Medicine

## 2011-05-18 MED ORDER — PREDNISONE (PAK) 10 MG PO TABS: ORAL_TABLET | ORAL | Status: DC

## 2011-05-18 NOTE — Telephone Encounter (Signed)
He has had one round of steroids for a contact dermatitis, but now needs another round. He is at [facility].

## 2011-05-18 NOTE — Telephone Encounter (Signed)
Received a call from Harle Battiest [facility] Nurse) stating pt needs another round of prednisone

## 2011-05-20 ENCOUNTER — Telehealth: Payer: Self-pay | Admitting: *Deleted

## 2011-05-20 NOTE — Telephone Encounter (Signed)
[facility] Triage Call Report Triage Record Num: [id number] Operator: Harle Battiest Patient Name: [name] [name] Call Date & Time: 05/18/2011 10:18:22AM Patient Phone: [phone] PCP: Crawford Givens Patient Gender: Male PCP Fax : Patient DOB: [birth year] Practice Name: [facility] Reason for Call: Odies reporting Had Rx for prednisone for poision oak-it could pop back up would need another RX. Has blisters again and itching. Afebrile. Wt. 280 lbs. First ankle then travel up body to legs and stomach.Poison oak onset 05/06/11. Finished steroid 05/16/11. Wants another Rx. Has pharmacy [phone] Paged on-call Dr Clent Ridges. Office called back-spoke to Alicia-the office will call Rx to Pharm. Called Patient informed Rx will be called in. Protocol(s) Used: [employer], [employer], or [facility] Exposure Recommended Outcome per Protocol: See Provider within 4 hours Reason for Outcome: Involvement of 25% or more of body surface area Care Advice: ~ Resting will help avoid overheating and sweating which will intensify the irritation. ~ Call provider if symptoms worsen or new symptoms develop. ~ SYMPTOM / CONDITION MANAGEMENT ~ List, or take, all current prescription(s), nonprescription or alternative medication(s) to provider for evaluation. 05/18/2011 12:01:08PM Page 1 of 1 [email or web address]

## 2011-05-20 NOTE — Telephone Encounter (Signed)
Noted  

## 2011-05-28 LAB — CBC
HCT: 27.3 — ABNORMAL LOW
HCT: 29 — ABNORMAL LOW
HCT: 30.8 — ABNORMAL LOW
HCT: 41.1
Hemoglobin: 14.1
Hemoglobin: 9.3 — ABNORMAL LOW
Hemoglobin: 9.5 — ABNORMAL LOW
Hemoglobin: 9.6 — ABNORMAL LOW
MCHC: 34.6
MCV: 91.4
Platelets: 193
Platelets: 224
Platelets: 238
RBC: 2.93 — ABNORMAL LOW
RBC: 3 — ABNORMAL LOW
RBC: 4.46
RDW: 12.5
RDW: 13
RDW: 13.2
WBC: 11.9 — ABNORMAL HIGH
WBC: 6
WBC: 8.5
WBC: 9.1
WBC: 9.5

## 2011-05-28 LAB — BASIC METABOLIC PANEL
BUN: 16
BUN: 35 — ABNORMAL HIGH
CO2: 27
CO2: 32
CO2: 33 — ABNORMAL HIGH
Calcium: 8.7
Calcium: 8.7
Calcium: 9.1
Calcium: 9.3
Calcium: 9.3
Calcium: 9.5
Calcium: 9.5
Chloride: 103
Chloride: 104
Creatinine, Ser: 1
Creatinine, Ser: 1.08
Creatinine, Ser: 1.17
Creatinine, Ser: 3.25 — ABNORMAL HIGH
Creatinine, Ser: 3.43 — ABNORMAL HIGH
GFR calc Af Amer: 22 — ABNORMAL LOW
GFR calc Af Amer: >60
GFR calc Af Amer: >60
GFR calc Af Amer: >60
GFR calc non Af Amer: 18 — ABNORMAL LOW
GFR calc non Af Amer: 19 — ABNORMAL LOW
GFR calc non Af Amer: 40 — ABNORMAL LOW
GFR calc non Af Amer: >60
GFR calc non Af Amer: >60
GFR calc non Af Amer: >60
GFR calc non Af Amer: >60
Glucose, Bld: 126 — ABNORMAL HIGH
Glucose, Bld: 137 — ABNORMAL HIGH
Glucose, Bld: 139 — ABNORMAL HIGH
Glucose, Bld: 141 — ABNORMAL HIGH
Glucose, Bld: 148 — ABNORMAL HIGH
Potassium: 3.6
Potassium: 3.8
Potassium: 4.4
Potassium: 4.8
Sodium: 137
Sodium: 139
Sodium: 139
Sodium: 139
Sodium: 142

## 2011-05-28 LAB — COMPREHENSIVE METABOLIC PANEL
ALT: 26
Albumin: 4.1
Alkaline Phosphatase: 43
Alkaline Phosphatase: 49
BUN: 20
CO2: 27
Chloride: 106
GFR calc non Af Amer: >60
Glucose, Bld: 160 — ABNORMAL HIGH
Glucose, Bld: 96
Potassium: 3.6
Potassium: 4.8
Sodium: 138
Total Bilirubin: 1.4 — ABNORMAL HIGH
Total Protein: 5.9 — ABNORMAL LOW

## 2011-05-28 LAB — B-NATRIURETIC PEPTIDE (CONVERTED LAB): Pro B Natriuretic peptide (BNP): 81.9

## 2011-05-28 LAB — TYPE AND SCREEN
ABO/RH(D): A POS
Antibody Screen: NEGATIVE

## 2011-05-28 LAB — CARDIAC PANEL(CRET KIN+CKTOT+MB+TROPI)
CK, MB: 2.3
CK, MB: 2.6
Relative Index: 0.9
Relative Index: 1
Total CK: 216
Troponin I: 0.43 — ABNORMAL HIGH

## 2011-05-28 LAB — CK TOTAL AND CKMB (NOT AT ARMC)
Relative Index: 1.3
Relative Index: 1.9

## 2011-05-28 LAB — PROTIME-INR
INR: 2.1 — ABNORMAL HIGH
INR: 2.4 — ABNORMAL HIGH
INR: 2.7 — ABNORMAL HIGH
Prothrombin Time: 18.3 — ABNORMAL HIGH
Prothrombin Time: 24.4 — ABNORMAL HIGH
Prothrombin Time: 28.7 — ABNORMAL HIGH
Prothrombin Time: 30.2 — ABNORMAL HIGH

## 2011-05-28 LAB — URINE CULTURE: Culture: NO GROWTH

## 2011-05-28 LAB — GLUCOSE, CAPILLARY: Glucose-Capillary: 153 — ABNORMAL HIGH

## 2011-05-28 LAB — URINALYSIS, ROUTINE W REFLEX MICROSCOPIC
Ketones, ur: NEGATIVE
Nitrite: NEGATIVE
Nitrite: NEGATIVE
Protein, ur: NEGATIVE
Specific Gravity, Urine: 1.017
Urobilinogen, UA: 1
Urobilinogen, UA: 1

## 2011-05-28 LAB — TROPONIN I
Troponin I: 0.01
Troponin I: 0.01

## 2011-05-28 LAB — BLOOD GAS, ARTERIAL
Acid-Base Excess: 3.7 — ABNORMAL HIGH
Bicarbonate: 29 — ABNORMAL HIGH
O2 Saturation: 87.2
Patient temperature: 103.2
TCO2: 27.4

## 2011-05-28 LAB — URINE MICROSCOPIC-ADD ON

## 2011-05-28 LAB — CULTURE, BLOOD (ROUTINE X 2): Culture: NO GROWTH

## 2011-05-28 LAB — APTT: aPTT: 26

## 2011-05-28 LAB — ABO/RH: ABO/RH(D): A POS

## 2011-05-28 LAB — NA AND K (SODIUM & POTASSIUM), RAND UR: Potassium Urine: 14

## 2011-06-06 ENCOUNTER — Encounter: Payer: 59 | Admitting: *Deleted

## 2011-06-10 ENCOUNTER — Encounter: Payer: Self-pay | Admitting: *Deleted

## 2011-06-12 ENCOUNTER — Encounter: Payer: Self-pay | Admitting: Internal Medicine

## 2011-06-12 ENCOUNTER — Other Ambulatory Visit: Payer: Self-pay

## 2011-06-12 ENCOUNTER — Ambulatory Visit (INDEPENDENT_AMBULATORY_CARE_PROVIDER_SITE_OTHER): Payer: 59 | Admitting: *Deleted

## 2011-06-12 DIAGNOSIS — I4891 Unspecified atrial fibrillation: Secondary | ICD-10-CM

## 2011-06-14 LAB — REMOTE PACEMAKER DEVICE
AL IMPEDENCE PM: 408 Ohm
ATRIAL PACING PM: 78.91
BAMS-0001: 170 {beats}/min
BATTERY VOLTAGE: 2.99 V
BATTERY VOLTAGE: 2.99 V
RV LEAD AMPLITUDE: 12.1 mv
RV LEAD IMPEDENCE PM: 512 Ohm
VENTRICULAR PACING PM: 97.11

## 2011-06-20 ENCOUNTER — Ambulatory Visit (INDEPENDENT_AMBULATORY_CARE_PROVIDER_SITE_OTHER): Payer: 59 | Admitting: Family Medicine

## 2011-06-20 ENCOUNTER — Encounter: Payer: Self-pay | Admitting: Family Medicine

## 2011-06-20 DIAGNOSIS — R05 Cough: Secondary | ICD-10-CM | POA: Insufficient documentation

## 2011-06-20 MED ORDER — BENZONATATE 200 MG PO CAPS: 200.0000 mg | ORAL_CAPSULE | Freq: Three times a day (TID) | ORAL | Status: AC | PRN

## 2011-06-20 MED ORDER — AZITHROMYCIN 250 MG PO TABS: ORAL_TABLET | ORAL | Status: AC

## 2011-06-20 NOTE — Progress Notes (Signed)
Pacer remote check  

## 2011-06-20 NOTE — Patient Instructions (Signed)
Drink plenty of fluids, gets some rest and start the antibiotics today.  Take the tessalon for cough.  This should gradually improve.  Take care.  Let us know if you have other concerns.

## 2011-06-20 NOTE — Progress Notes (Signed)
duration of symptoms: 3 weeks "I feel like I get better then it comes back to haunt me." Rhinorrhea: occ in AM Congestion: yes ear pain:no sore throat: no Cough: yes, fits of coughing, some sputum occ, some dry cough Myalgias: no other concerns: voice is lower Occ wheeze vs UAN noted by patient  ROS: See HPI.  Otherwise negative.    Meds, vitals, and allergies reviewed.   GEN: nad, alert and oriented HEENT: mucous membranes moist, TM w/o erythema, nasal epithelium injected, OP with cobblestoning NECK: supple w/o LA CV: rrr. PULM: no inc wob but he has fits of coughing here in the clinic.  Diffuse coarse BS w/o focal dec in BS.  He does have some UAN but no wheeze inferiorly EXT: no edema, no cyanosis

## 2011-06-20 NOTE — Assessment & Plan Note (Signed)
Given duration, start zithromax to cover atypicals and use tessalon for cough.  Nontoxic.  D/w pt.  He agreed, this should gradually improve.  Call back prn.  He agrees.

## 2011-06-24 ENCOUNTER — Encounter: Payer: Self-pay | Admitting: *Deleted

## 2011-07-23 ENCOUNTER — Other Ambulatory Visit: Payer: Self-pay | Admitting: Family Medicine

## 2011-07-24 ENCOUNTER — Other Ambulatory Visit: Payer: Self-pay | Admitting: Internal Medicine

## 2011-07-24 ENCOUNTER — Other Ambulatory Visit: Payer: Self-pay | Admitting: *Deleted

## 2011-07-24 MED ORDER — AMLODIPINE BESYLATE 5 MG PO TABS: 5.0000 mg | ORAL_TABLET | Freq: Two times a day (BID) | ORAL | Status: DC

## 2011-08-11 ENCOUNTER — Other Ambulatory Visit: Payer: Self-pay | Admitting: Internal Medicine

## 2011-09-12 ENCOUNTER — Encounter: Payer: 59 | Admitting: *Deleted

## 2011-09-16 ENCOUNTER — Encounter: Payer: Self-pay | Admitting: *Deleted

## 2011-09-16 ENCOUNTER — Other Ambulatory Visit: Payer: Self-pay

## 2011-09-16 ENCOUNTER — Encounter: Payer: Self-pay | Admitting: Internal Medicine

## 2011-09-16 ENCOUNTER — Ambulatory Visit (INDEPENDENT_AMBULATORY_CARE_PROVIDER_SITE_OTHER): Payer: 59 | Admitting: *Deleted

## 2011-09-16 DIAGNOSIS — I4891 Unspecified atrial fibrillation: Secondary | ICD-10-CM

## 2011-09-16 MED ORDER — DABIGATRAN ETEXILATE MESYLATE 150 MG PO CAPS: 150.0000 mg | ORAL_CAPSULE | Freq: Two times a day (BID) | ORAL | Status: DC

## 2011-09-16 NOTE — Telephone Encounter (Signed)
..   Requested Prescriptions   Signed Prescriptions Disp Refills  . dabigatran (PRADAXA) 150 MG CAPS 60 capsule 0    Sig: Take 1 capsule (150 mg total) by mouth every 12 (twelve) hours.    Authorizing Provider: Hillis Range D    Ordering User: Christella Hartigan, Jabir Dahlem Judie Petit

## 2011-09-17 ENCOUNTER — Telehealth: Payer: Self-pay | Admitting: Internal Medicine

## 2011-09-17 NOTE — Telephone Encounter (Signed)
New Msg: Pt calling staing that someone called him yesterday and left msg that pt needed to schedule appt to see Dr. Johney [name]. However, our records state that pt is not due to see Dr. Johney [name] until July 2013. Please return pt call to discuss further if an appointment for pt to see Dr. Johney [name] is necessary.

## 2011-09-18 MED ORDER — DABIGATRAN ETEXILATE MESYLATE 150 MG PO CAPS: 150.0000 mg | ORAL_CAPSULE | Freq: Two times a day (BID) | ORAL | Status: DC

## 2011-09-18 NOTE — Telephone Encounter (Signed)
Returned call to pt. Pt sent transmission was received and pt aware that dr allred will review. Pt was calling about pradaxa refills. Need more than 30 day supply or else pt will have to call monthly for refills. Will send to [facility] to see about refills.

## 2011-09-18 NOTE — Telephone Encounter (Signed)
Fu call Patient calling back again 

## 2011-09-22 LAB — REMOTE PACEMAKER DEVICE
ATRIAL PACING PM: 47.56
BRDY-0002RA: 60 {beats}/min
BRDY-0003RA: 130 {beats}/min
BRDY-0004RA: 130 {beats}/min

## 2011-09-23 NOTE — Progress Notes (Signed)
Remote pacer check  

## 2011-09-24 ENCOUNTER — Other Ambulatory Visit: Payer: Self-pay | Admitting: *Deleted

## 2011-09-24 ENCOUNTER — Encounter: Payer: Self-pay | Admitting: *Deleted

## 2011-09-24 ENCOUNTER — Telehealth: Payer: Self-pay | Admitting: Family Medicine

## 2011-09-24 MED ORDER — SCOPOLAMINE 1 MG/3DAYS TD PT72: 1.0000 | MEDICATED_PATCH | TRANSDERMAL | Status: DC

## 2011-09-24 NOTE — Telephone Encounter (Signed)
Patient is going on a cruise on Saturday.  He needs a patch for motion sickness for 14 days.  Patient uses [facility].

## 2011-09-24 NOTE — Telephone Encounter (Signed)
rx sent.  1 patch every 3 days.  I believe it comes in a 10 pack.

## 2011-09-24 NOTE — Telephone Encounter (Signed)
LMOVM that Rx is sent.

## 2011-11-11 ENCOUNTER — Other Ambulatory Visit: Payer: Self-pay | Admitting: Family Medicine

## 2011-11-11 NOTE — Telephone Encounter (Signed)
Electronic refill request

## 2011-11-12 NOTE — Telephone Encounter (Signed)
Sent!

## 2011-11-21 ENCOUNTER — Other Ambulatory Visit: Payer: Self-pay

## 2011-11-21 NOTE — Telephone Encounter (Signed)
Pt walked in and said Famciclovir 500 mg was sent to [facility] on 11/11/11 with instructions take one tab twice a day for 5 days. #30 given with 2 add'l refills.  Pt said insurance would only give #10 pills for $30.00 with instructions as written 1 tab bid for 5 days and if written take as needed pt can get 30 pills for $30.00. Pt can be reached at [phone].

## 2011-11-24 MED ORDER — FAMCICLOVIR 500 MG PO TABS: ORAL_TABLET | ORAL | Status: DC

## 2011-11-24 NOTE — Telephone Encounter (Signed)
rx resent.  Notify pt.

## 2011-11-25 NOTE — Telephone Encounter (Signed)
Patient advised.

## 2011-11-26 ENCOUNTER — Other Ambulatory Visit: Payer: Self-pay | Admitting: Internal Medicine

## 2011-11-26 ENCOUNTER — Other Ambulatory Visit: Payer: Self-pay

## 2011-11-26 MED ORDER — BENAZEPRIL HCL 40 MG PO TABS: 40.0000 mg | ORAL_TABLET | Freq: Two times a day (BID) | ORAL | Status: DC

## 2011-11-26 NOTE — Telephone Encounter (Signed)
Out of pills 

## 2011-12-15 ENCOUNTER — Other Ambulatory Visit: Payer: Self-pay | Admitting: Cardiology

## 2011-12-19 ENCOUNTER — Ambulatory Visit (INDEPENDENT_AMBULATORY_CARE_PROVIDER_SITE_OTHER): Payer: 59 | Admitting: *Deleted

## 2011-12-19 DIAGNOSIS — I498 Other specified cardiac arrhythmias: Secondary | ICD-10-CM

## 2011-12-20 ENCOUNTER — Encounter: Payer: Self-pay | Admitting: Internal Medicine

## 2011-12-23 LAB — REMOTE PACEMAKER DEVICE
BATTERY VOLTAGE: 2.99 V
BRDY-0004RV: 130 {beats}/min
RV LEAD AMPLITUDE: 5.2 mv

## 2011-12-24 NOTE — Progress Notes (Signed)
Remote icd check  

## 2011-12-31 ENCOUNTER — Encounter (HOSPITAL_COMMUNITY): Payer: Self-pay | Admitting: *Deleted

## 2011-12-31 NOTE — Progress Notes (Signed)
Cardiac device Perioperative Prescriptiopn sent to [facility].  [employer] notified of pt's surgery scheduled for 01/01/12.

## 2011-12-31 NOTE — Progress Notes (Signed)
Leta Jungling from [employer] called back to say she has been notified that patient has surgery scheduled for 01/01/12.

## 2011-12-31 NOTE — Progress Notes (Signed)
I called Dr Ephriam Knuckles office for orders for surgery scheduled on 12/02/11.

## 2012-01-01 ENCOUNTER — Ambulatory Visit (HOSPITAL_COMMUNITY): Payer: 59

## 2012-01-01 ENCOUNTER — Encounter (HOSPITAL_COMMUNITY): Payer: Self-pay | Admitting: Anesthesiology

## 2012-01-01 ENCOUNTER — Ambulatory Visit (HOSPITAL_COMMUNITY): Payer: 59 | Admitting: Anesthesiology

## 2012-01-01 ENCOUNTER — Ambulatory Visit (HOSPITAL_COMMUNITY)
Admission: RE | Admit: 2012-01-01 | Discharge: 2012-01-01 | Disposition: A | Discharge status: Home / Self Care | Payer: 59 | Source: Ambulatory Visit | Attending: Ophthalmology | Admitting: Ophthalmology

## 2012-01-01 ENCOUNTER — Encounter (HOSPITAL_COMMUNITY): Payer: Self-pay | Admitting: *Deleted

## 2012-01-01 ENCOUNTER — Encounter (HOSPITAL_COMMUNITY)
Admission: RE | Disposition: A | Discharge status: Home / Self Care | Payer: Self-pay | Source: Ambulatory Visit | Attending: Ophthalmology

## 2012-01-01 DIAGNOSIS — I2510 Atherosclerotic heart disease of native coronary artery without angina pectoris: Secondary | ICD-10-CM | POA: Insufficient documentation

## 2012-01-01 DIAGNOSIS — I10 Essential (primary) hypertension: Secondary | ICD-10-CM | POA: Insufficient documentation

## 2012-01-01 DIAGNOSIS — K219 Gastro-esophageal reflux disease without esophagitis: Secondary | ICD-10-CM | POA: Insufficient documentation

## 2012-01-01 DIAGNOSIS — H3520 Other non-diabetic proliferative retinopathy, unspecified eye: Secondary | ICD-10-CM | POA: Insufficient documentation

## 2012-01-01 DIAGNOSIS — H338 Other retinal detachments: Secondary | ICD-10-CM | POA: Insufficient documentation

## 2012-01-01 HISTORY — PX: SILICON OIL REMOVAL: SHX5305

## 2012-01-01 HISTORY — PX: PARS PLANA VITRECTOMY: SHX2166

## 2012-01-01 HISTORY — DX: Chronic kidney disease, unspecified: N18.9

## 2012-01-01 LAB — CBC
MCH: 31.7 pg (ref 26.0–34.0)
MCHC: 35.8 g/dL (ref 30.0–36.0)
MCV: 88.5 fL (ref 78.0–100.0)
Platelets: 192 10*3/uL (ref 150–400)
RBC: 4.89 MIL/uL (ref 4.22–5.81)
RDW: 13.5 % (ref 11.5–15.5)

## 2012-01-01 LAB — BASIC METABOLIC PANEL
Calcium: 9.5 mg/dL (ref 8.4–10.5)
Creatinine, Ser: 0.84 mg/dL (ref 0.50–1.35)
GFR calc Af Amer: >90 mL/min (ref >90)
GFR calc non Af Amer: 87 mL/min — ABNORMAL LOW (ref >90)
Sodium: 138 mEq/L (ref 135–145)

## 2012-01-01 LAB — SURGICAL PCR SCREEN: MRSA, PCR: NEGATIVE

## 2012-01-01 SURGERY — PARS PLANA VITRECTOMY WITH 20 GAUGE
Anesthesia: General | Site: Eye | Laterality: Right | Wound class: Clean

## 2012-01-01 MED ORDER — FENTANYL CITRATE 0.05 MG/ML IJ SOLN
INTRAMUSCULAR | Status: DC | PRN
  Administered 2012-01-01: 100 ug via INTRAVENOUS

## 2012-01-01 MED ORDER — DEXAMETHASONE SODIUM PHOSPHATE 10 MG/ML IJ SOLN
INTRAMUSCULAR | Status: DC | PRN
  Administered 2012-01-01: 10 mg

## 2012-01-01 MED ORDER — NA CHONDROIT SULF-NA HYALURON 40-30 MG/ML IO SOLN
INTRAOCULAR | Status: DC | PRN
  Administered 2012-01-01: 0.5 mL via INTRAOCULAR

## 2012-01-01 MED ORDER — SUCCINYLCHOLINE CHLORIDE 20 MG/ML IJ SOLN
INTRAMUSCULAR | Status: DC | PRN
  Administered 2012-01-01 (×2): 40 mg via INTRAVENOUS
  Administered 2012-01-01: 120 mg via INTRAVENOUS

## 2012-01-01 MED ORDER — 0.9 % SODIUM CHLORIDE (POUR BTL) OPTIME
TOPICAL | Status: DC | PRN
  Administered 2012-01-01: 1000 mL

## 2012-01-01 MED ORDER — DEXTROSE 5 % IV SOLN
INTRAVENOUS | Status: DC | PRN
  Administered 2012-01-01: 14:00:00 via INTRAVENOUS

## 2012-01-01 MED ORDER — CYCLOPENTOLATE HCL 1 % OP SOLN
OPHTHALMIC | Status: AC
  Administered 2012-01-01: 1 [drp] via OPHTHALMIC
  Filled 2012-01-01: qty 2

## 2012-01-01 MED ORDER — HYPROMELLOSE (GONIOSCOPIC) 2.5 % OP SOLN
OPHTHALMIC | Status: DC | PRN
  Administered 2012-01-01: 2 [drp]

## 2012-01-01 MED ORDER — PHENYLEPHRINE HCL 10 MG/ML IJ SOLN
INTRAMUSCULAR | Status: DC | PRN
  Administered 2012-01-01 (×2): 50 ug via INTRAVENOUS

## 2012-01-01 MED ORDER — ONDANSETRON HCL 4 MG/2ML IJ SOLN
INTRAMUSCULAR | Status: DC | PRN
  Administered 2012-01-01: 4 mg via INTRAVENOUS

## 2012-01-01 MED ORDER — FENTANYL CITRATE 0.05 MG/ML IJ SOLN
25.0000 ug | INTRAMUSCULAR | Status: DC | PRN
  Administered 2012-01-01 (×2): 25 ug via INTRAVENOUS
  Administered 2012-01-01: 50 ug via INTRAVENOUS
  Administered 2012-01-01: 25 ug via INTRAVENOUS

## 2012-01-01 MED ORDER — PROPOFOL 10 MG/ML IV EMUL
INTRAVENOUS | Status: DC | PRN
  Administered 2012-01-01 (×2): 50 mg via INTRAVENOUS
  Administered 2012-01-01: 200 mg via INTRAVENOUS

## 2012-01-01 MED ORDER — CEFAZOLIN SODIUM 1-5 GM-% IV SOLN
INTRAVENOUS | Status: DC | PRN
  Administered 2012-01-01: 1 g via INTRAVENOUS

## 2012-01-01 MED ORDER — BSS PLUS IO SOLN
INTRAOCULAR | Status: DC | PRN
  Administered 2012-01-01: 1 via INTRAOCULAR

## 2012-01-01 MED ORDER — PHENYLEPHRINE HCL 2.5 % OP SOLN
OPHTHALMIC | Status: AC
  Administered 2012-01-01: 1 [drp] via OPHTHALMIC
  Filled 2012-01-01: qty 3

## 2012-01-01 MED ORDER — CYCLOPENTOLATE HCL 1 % OP SOLN
1.0000 [drp] | OPHTHALMIC | Status: AC
  Administered 2012-01-01 (×3): 1 [drp] via OPHTHALMIC

## 2012-01-01 MED ORDER — PHENYLEPHRINE HCL 2.5 % OP SOLN
1.0000 [drp] | Freq: Once | OPHTHALMIC | Status: AC
  Administered 2012-01-01 (×3): 1 [drp] via OPHTHALMIC

## 2012-01-01 MED ORDER — MIDAZOLAM HCL 5 MG/5ML IJ SOLN
INTRAMUSCULAR | Status: DC | PRN
  Administered 2012-01-01: 2 mg via INTRAVENOUS

## 2012-01-01 MED ORDER — FENTANYL CITRATE 0.05 MG/ML IJ SOLN
INTRAMUSCULAR | Status: AC
  Filled 2012-01-01: qty 2

## 2012-01-01 MED ORDER — MUPIROCIN 2 % EX OINT
TOPICAL_OINTMENT | Freq: Two times a day (BID) | CUTANEOUS | Status: DC
  Administered 2012-01-01: 11:00:00 via NASAL
  Filled 2012-01-01 (×2): qty 22

## 2012-01-01 MED ORDER — EPINEPHRINE HCL 1 MG/ML IJ SOLN
INTRAOCULAR | Status: DC | PRN
  Administered 2012-01-01: 13:00:00

## 2012-01-01 MED ORDER — GATIFLOXACIN 0.5 % OP SOLN
OPHTHALMIC | Status: AC
  Administered 2012-01-01: 1 [drp] via OPHTHALMIC
  Filled 2012-01-01: qty 2.5

## 2012-01-01 MED ORDER — LACTATED RINGERS IV SOLN: INTRAVENOUS | Status: DC

## 2012-01-01 MED ORDER — SODIUM CHLORIDE 0.9 % IV SOLN
INTRAVENOUS | Status: DC
  Administered 2012-01-01: 12:00:00 via INTRAVENOUS

## 2012-01-01 MED ORDER — GATIFLOXACIN 0.5 % OP SOLN
1.0000 [drp] | OPHTHALMIC | Status: AC | PRN
  Administered 2012-01-01 (×3): 1 [drp] via OPHTHALMIC

## 2012-01-01 MED ORDER — LACTATED RINGERS IV SOLN
INTRAVENOUS | Status: DC | PRN
  Administered 2012-01-01: 13:00:00 via INTRAVENOUS

## 2012-01-01 MED ORDER — CEFAZOLIN SODIUM 1-5 GM-% IV SOLN
INTRAVENOUS | Status: AC
  Filled 2012-01-01: qty 50

## 2012-01-01 SURGICAL SUPPLY — 67 items
ACCESSORY FRAGMATOME (MISCELLANEOUS) IMPLANT
APPLICATOR COTTON TIP 6IN STRL (MISCELLANEOUS) ×2 IMPLANT
APPLICATOR DR MATTHEWS STRL (MISCELLANEOUS) IMPLANT
BLADE MVR KNIFE 20G (BLADE) ×2 IMPLANT
CANNULA ANT CHAM MAIN (OPHTHALMIC RELATED) IMPLANT
CANNULA FLEX TIP 25G (CANNULA) ×2 IMPLANT
CLOTH BEACON ORANGE TIMEOUT ST (SAFETY) ×2 IMPLANT
CORDS BIPOLAR (ELECTRODE) IMPLANT
COVER SURGICAL LIGHT HANDLE (MISCELLANEOUS) ×2 IMPLANT
DRAPE OPHTHALMIC 77X100 STRL (CUSTOM PROCEDURE TRAY) ×2 IMPLANT
ERASER HMR WETFIELD 23G BP (MISCELLANEOUS) IMPLANT
FILTER BLUE MILLIPORE (MISCELLANEOUS) IMPLANT
FILTER STRAW FLUID ASPIR (MISCELLANEOUS) ×2 IMPLANT
FORCEPS ECKARDT ILM 25G SERR (OPHTHALMIC RELATED) ×2 IMPLANT
FORCEPS HORIZONTAL 25G DISP (OPHTHALMIC RELATED) IMPLANT
GAS OPHTHALMIC (MISCELLANEOUS) IMPLANT
GLOVE ECLIPSE 6.5 STRL STRAW (GLOVE) ×2 IMPLANT
GLOVE SS BIOGEL STRL SZ 8.5 (GLOVE) ×2 IMPLANT
GLOVE SS N UNI LF 6.0 STRL (GLOVE) ×2 IMPLANT
GLOVE SUPERSENSE BIOGEL SZ 8.5 (GLOVE) ×2
GOWN EXTRA PROTECTION XL (GOWNS) ×2 IMPLANT
GOWN STRL NON-REIN LRG LVL3 (GOWN DISPOSABLE) ×2 IMPLANT
ILLUMINATOR ENDO 25GA (MISCELLANEOUS) ×2 IMPLANT
KIT PERFLUORON PROCEDURE 5ML (MISCELLANEOUS) ×2 IMPLANT
KIT ROOM TURNOVER OR (KITS) ×2 IMPLANT
KNIFE CRESCENT 2.5 55 ANG (BLADE) IMPLANT
LENS BIOM SUPER VIEW SET DISP (OPHTHALMIC RELATED) ×2 IMPLANT
MARKER SKIN DUAL TIP RULER LAB (MISCELLANEOUS) ×2 IMPLANT
MASK EYE SHIELD (GAUZE/BANDAGES/DRESSINGS) ×2 IMPLANT
MICROPICK 25G (MISCELLANEOUS)
NEEDLE 18GX1X1/2 (RX/OR ONLY) (NEEDLE) ×2 IMPLANT
NEEDLE 25GX 5/8IN NON SAFETY (NEEDLE) ×2 IMPLANT
NEEDLE HYPO 30X.5 LL (NEEDLE) IMPLANT
NS IRRIG 1000ML POUR BTL (IV SOLUTION) ×2 IMPLANT
OIL SILICONE OPHTHALMIC ADAPTO (Ophthalmic Related) ×2 IMPLANT
PACK VITRECTOMY CUSTOM (CUSTOM PROCEDURE TRAY) ×2 IMPLANT
PACK VITRECTOMY PIC MCHSVP (PACKS) ×2 IMPLANT
PAD ARMBOARD 7.5X6 YLW CONV (MISCELLANEOUS) ×4 IMPLANT
PAD EYE OVAL STERILE LF (GAUZE/BANDAGES/DRESSINGS) ×2 IMPLANT
PENCIL BIPOLAR 25GA STR DISP (OPHTHALMIC RELATED) IMPLANT
PICK MICROPICK 25G (MISCELLANEOUS) IMPLANT
PROBE COHERENT CURVED (MISCELLANEOUS) ×2 IMPLANT
PROBE DIRECTIONAL LASER (MISCELLANEOUS) ×2 IMPLANT
REPL STRA BRUSH NEEDLE (NEEDLE) IMPLANT
RESERVOIR BACK FLUSH (MISCELLANEOUS) IMPLANT
ROLLS DENTAL (MISCELLANEOUS) ×4 IMPLANT
SCRAPER DIAMOND 25GA (OPHTHALMIC RELATED) IMPLANT
SET FLUID INJECTOR (SET/KITS/TRAYS/PACK) ×2 IMPLANT
STOCKINETTE IMPERVIOUS 9X36 MD (GAUZE/BANDAGES/DRESSINGS) ×4 IMPLANT
STOPCOCK 4 WAY LG BORE MALE ST (IV SETS) IMPLANT
SUT ETHILON 10 0 CS140 6 (SUTURE) IMPLANT
SUT ETHILON 8 0 BV130 4 (SUTURE) IMPLANT
SUT MERSILENE 5 0 RD 1 DA (SUTURE) ×2 IMPLANT
SUT PROLENE 10 0 CIF 4 DA (SUTURE) IMPLANT
SUT PROLENE 7 0 P 1 (SUTURE) IMPLANT
SUT PROLENE 8 0 BV175 6 (SUTURE) IMPLANT
SUT SILK 2 0 (SUTURE)
SUT SILK 2-0 18XBRD TIE 12 (SUTURE) IMPLANT
SUT VICRYL 7 0 TG140 8 (SUTURE) IMPLANT
SYR 30ML SLIP (SYRINGE) IMPLANT
SYR 5ML LL (SYRINGE) ×2 IMPLANT
SYR TB 1ML LUER SLIP (SYRINGE) ×2 IMPLANT
TAPE SURG TRANSPORE 1 IN (GAUZE/BANDAGES/DRESSINGS) ×1 IMPLANT
TAPE SURGICAL TRANSPORE 1 IN (GAUZE/BANDAGES/DRESSINGS) ×1
TOWEL OR 17X24 6PK STRL BLUE (TOWEL DISPOSABLE) ×4 IMPLANT
WATER STERILE IRR 1000ML POUR (IV SOLUTION) ×2 IMPLANT
WIPE INSTRUMENT VISIWIPE 73X73 (MISCELLANEOUS) ×2 IMPLANT

## 2012-01-01 NOTE — H&P (Signed)
[name] [name] is an 70 y.o. male.   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  Past Medical History  Diagnosis Date  . CAD (coronary artery disease)     s/p PCI 2003. Last stress test in Feb 2011 was normal  . Paroxysmal atrial fibrillation     on Multaq and Pradaxa  . HTN (hypertension)   . ETOH abuse   . DJD (degenerative joint disease)     s/p Left TKA 06/2008  . Metabolic syndrome   . Hematuria   . Psychosexual dysfunction with inhibited sexual excitement   . Herpes zoster without mention of complication   . Hypertrophy of prostate without urinary obstruction and other lower urinary tract symptoms (LUTS)   . Anxiety state, unspecified   . Allergic rhinitis, cause unspecified   . OSA on CPAP     compliant with CPAP  . Obesity   . Hyperlipidemia   . GERD (gastroesophageal reflux disease)   . Sick sinus syndrome     s/p PPM by Dr Deborah Chalk  . Atrial flutter     s/p atrial flutter CTI ablation 10/26/10  . Hiatal hernia   . Gout   . Pacemaker   . Sleep apnea   . Chronic kidney disease     Kidney Stones  . Cancer     Past Surgical History  Procedure Date  . Esophagogastroduodenoscopy 1992    Dr. Doreatha Martin  . Hiatal hernia repair 04/1998  . Balloon angioplasty, artery 2003    Ostium of the 2nd DX  . Cholecystectomy 1999    Dr. Luan Pulling  . Basal cell carcinoma excision 12/2003    On back  . Total knee arthroplasty 06/2008    Dr. Despina Hick  . Pacemaker insertion 10/2009    [employer]  . Atrial flutter ablation 10/26/10  . Appendectomy   . Cystoscopy     stone removal    Family History  Problem Relation Age of Onset  . Bone cancer Mother   . Dementia Mother   . Breast cancer Mother     metastatic   . Heart attack Father   . Hypertension Father   . Aneurysm Sister   . Colon cancer Neg Hx   . Anesthesia problems Neg  Hx    Social History:  reports that he quit smoking about 21 years ago. He has never used smokeless tobacco. He reports that he drinks about 7.2 ounces of alcohol per week. He reports that he does not use illicit drugs.  Allergies:  Allergies  Allergen Reactions  . Escitalopram Oxalate     REACTION: no ejaculation  . Morphine Sulfate     REACTION: unspecified    No prescriptions prior to admission    No results found for this or any previous visit (from the past 48 hour(s)). No results found.  Review of Systems  Constitutional: Negative.   Eyes: Positive for blurred vision.  All other systems reviewed and are negative.    There were no vitals taken for this visit. Physical Exam  Constitutional: He is oriented to person, place, and time. He appears well-developed and well-nourished.  HENT:  Head: Normocephalic and atraumatic.  Eyes:    Neck: Normal range of motion. Neck supple.  Cardiovascular: Normal rate and normal heart sounds.   Respiratory: Effort normal and breath sounds normal.  GI: Soft.  Musculoskeletal: Normal range of motion.  Neurological: He is alert and oriented to person, place, and time. He has normal reflexes.  Skin: Skin is warm and dry.  Psychiatric: He has a normal mood and affect. His behavior is normal. Judgment and thought content normal.     Assessment/Plan   WILL ATTEMPT SURGICAL REPAIR OF CURRENT RETINAL DETACHMENT WITH VITRECTOMY REMOVAL AND REPLACE OF SILICONE OIL OF THE RIGHT EYE   Hortensia Duffin A 01/01/2012, 9:22 AM

## 2012-01-01 NOTE — Preoperative (Signed)
Beta Blockers   Reason not to administer Beta Blockers:Not Applicable 

## 2012-01-01 NOTE — Anesthesia Procedure Notes (Signed)
Procedure Name: Intubation Date/Time: 01/01/2012 1:30 PM Performed by: Nessie Nong S Pre-anesthesia Checklist: Patient identified, Emergency Drugs available, Suction available, Patient being monitored and Timeout performed Patient Re-evaluated:Patient Re-evaluated prior to inductionOxygen Delivery Method: Circle system utilized Preoxygenation: Pre-oxygenation with 100% oxygen Intubation Type: IV induction Ventilation: Mask ventilation without difficulty and Oral airway inserted - appropriate to patient size Laryngoscope size: glidescope. Grade View: Grade III Tube type: Oral Tube size: 7.0 mm Number of attempts: 2 Airway Equipment and Method: Stylet Placement Confirmation: ETT inserted through vocal cords under direct vision,  positive ETCO2 and breath sounds checked- equal and bilateral Secured at: 23 cm Tube secured with: Tape Dental Injury: Teeth and Oropharynx as per pre-operative assessment

## 2012-01-01 NOTE — Anesthesia Preprocedure Evaluation (Addendum)
Anesthesia Evaluation  Patient identified by MRN, date of birth, ID band Patient awake    Reviewed: Allergy & Precautions, H&P , NPO status , Patient's Chart, lab work & pertinent test results  History of Anesthesia Complications Negative for: history of anesthetic complications  Airway Mallampati: III TM Distance: >3 FB Neck ROM: Full    Dental  (+) Teeth Intact and Dental Advisory Given   Pulmonary sleep apnea and Continuous Positive Airway Pressure Ventilation ,  breath sounds clear to auscultation        Cardiovascular hypertension, Pt. on medications + CAD and + Cardiac Stents (2006; denies cp/sob) + dysrhythmias (pradaxa; afib/flutter; sick sinus syndrome) Atrial Fibrillation + pacemaker - Valvular Problems/MurmursRhythm:Regular Rate:Normal  Patient denies history of heart murmur. CE   Neuro/Psych Anxiety    GI/Hepatic Neg liver ROS, hiatal hernia, GERD-  Controlled,  Endo/Other  negative endocrine ROSMorbid obesityHerpes zoster Gout  Renal/GU negative Renal ROS Bladder dysfunction      Musculoskeletal  (+) Arthritis -, Osteoarthritis,    Abdominal (+) + obese,   Peds  Hematology negative hematology ROS (+) Pradaxa   Anesthesia Other Findings   Reproductive/Obstetrics                       Anesthesia Physical Anesthesia Plan  ASA: III  Anesthesia Plan: General   Post-op Pain Management:    Induction: Intravenous  Airway Management Planned: Mask and Oral ETT  Additional Equipment:   Intra-op Plan:   Post-operative Plan: Extubation in OR  Informed Consent: I have reviewed the patients History and Physical, chart, labs and discussed the procedure including the risks, benefits and alternatives for the proposed anesthesia with the patient or authorized representative who has indicated his/her understanding and acceptance.   Dental advisory given  Plan Discussed with: CRNA,  Anesthesiologist and Surgeon  Anesthesia Plan Comments:        Anesthesia Quick Evaluation

## 2012-01-01 NOTE — Transfer of Care (Signed)
Immediate Anesthesia Transfer of Care Note  Patient: [name] [name]  Procedure(s) Performed: Procedure(s) (LRB): PARS PLANA VITRECTOMY WITH 20 GAUGE (Right) SILICON OIL REMOVAL (Right) MEMBRANE PEEL (Right)  Patient Location: PACU  Anesthesia Type: General  Level of Consciousness: awake, alert  and oriented  Airway & Oxygen Therapy: Patient Spontanous Breathing and Patient connected to nasal cannula oxygen  Post-op Assessment: Report given to PACU RN and Post -op Vital signs reviewed and stable  Post vital signs: Reviewed and stable  Complications: No apparent anesthesia complications

## 2012-01-01 NOTE — Brief Op Note (Signed)
01/01/2012  2:57 PM  PATIENT:  [name] [name]  71 y.o. male  PRE-OPERATIVE DIAGNOSIS:  Retinal detachment right eye, recurrent traction,, with proliferative vitreoretinopathy  POST-OPERATIVE DIAGNOSIS:  Retinal detachment right eye,,,,same  PROCEDURE:  Procedure(s) (LRB): PARS PLANA VITRECTOMY WITH 25 GAUGE (Right), with SILICON OIL REMOVAL (Right) nonmagnetic foreign body,, and  MEMBRANE PEEL (Right), endolaser, repair complex retinal detachment, with instillation of silicone oil 5000cs.  SURGEON:  Surgeon(s) and Role:    * Edmon Crape, MD - Primary  PHYSICIAN ASSISTANT:   ASSISTANTS: none   ANESTHESIA:   general  EBL:  Total I/O In: 850 [I.V.:850] Out: -   BLOOD ADMINISTERED:none  DRAINS: none   LOCAL MEDICATIONS USED:  NONE  SPECIMEN:  No Specimen  DISPOSITION OF SPECIMEN:  N/A  COUNTS:  YES  TOURNIQUET:  * No tourniquets in log *  DICTATION: .Other Dictation: Dictation Number [id number]  PLAN OF CARE: Discharge to home after PACU  PATIENT DISPOSITION:  PACU - hemodynamically stable.   Delay start of Pharmacological VTE agent (>24hrs) due to surgical blood loss or risk of bleeding: not applicable

## 2012-01-02 NOTE — Op Note (Signed)
NAMEGIVANNI, STARON NO.:  [id number]  MEDICAL RECORD NO.:  [id number]  LOCATION:  [facility]                         FACILITY:  [facility]  PHYSICIAN:  Alford Highland. Loyalty Arentz, M.D.   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  PROCEDURE:  Posterior vitrectomy-25 gauge plus-right eye with removal of nonmagnetic foreign body - silicone oil and membrane peel -retinectomy inferonasally as well as resection of complex retinal detachment.  In an air-fluid exchange, endolaser photocoagulation - retinopexy and insulation of silicone oil 5000 centistokes.  SURGEON:  Alford Highland. Jocie Meroney, MD  ANESTHESIA:  General endotracheal anesthesia.  INDICATION FOR PROCEDURE:  The patient is a 71 year old male who has profound vision loss in the left eye on the basis of complex retinal detachment, macular detachment, __________ retinopathy. This is her fourth surgery in an attempt to re-attach retina and to remove potentially __________ and complete re-detachment.  The patient understands the risks of anesthesia including the recurrence, death __________ eye including but not limited to hemorrhage, infection, scarring, need for another surgery, change in vision, loss of vision, progressive disease despite intervention.  DESCRIPTION OF PROCEDURE:  After appropriate signed consent was obtained, the patient was taken to the operating room.  In the operating room, appropriate monitors followed by mild sedation.  Appropriate site selection __________ the operating staff thereafter general anesthesia was given without difficulty.  The right periocular  region was prepped and draped usual sterile fashion.  Lid speculum applied.  A 25-gauge trocar was placed inferotemporal quadrant trocar, superior trocar was applied.  Superior temporal trocar was plugged, __________ fashioned superotemporally adjacent to this area and the sclera was entered with a 20-gauge MVR.  An 18-gauge silastic cannula was then used to extract silicone oil from the vitreous cavity with extraction set.  Thereafter, __________ of the retina was noted although there is some foreshortening and intraretinal fibrosis at the 4, 5 and 6 o'clock positions, near the ora serrata.  __________ was engaged with forceps.  Thereafter, the resected areas near the ora serrata was resected in a retinacular fashion.  Excellent mobilization of the retina was obtained.  No bleeding occurred.  At this time, a fluid exchange was not successful because of some accumulated subretinal fluid posteriorly and thus fluid was reinstituted.  Several fluid air exchanges were carried out so as to remove the remainder of the previous 1000 centistokes __________.  At this time, excellent fluid air exchange completed, small retinotomy was created __________ optic nerve.  __________ was then used to cover the macular region so as to flatten this area nicely and also allow for peripheral expression, peripheral subretinal fluid.  This was carried out nicely and indeed all subretinal fluid was removed in the macular region, posterior pole as well as peripherally from the retinotomy section, as well as a posterior retinotomy, as well as a peripheral retinectomy site.  Endolaser  photocoagulation carried out while the Perfluoron was in place.  At this time, Perfluoron was slowly removed. The retina remained nicely attached.  Endolaser photocoagulation was then added.  The retinotomies were closed.  At this time, __________ silicone oil 5000 centistokes was then carried out.  This exchange was carried out  through the sclerotomy.  The superior sclerotomy was then closed with excellent oil fill.  The infusion was then removed and somewhat closed.  Conjunctiva closed with 0 Vicryl suture. Subconjunctival Decadron applied after excellent oil fill was obtained. Retina was nicely attached.  Sterile patch and Fox shield was applied to right eye.  The patient tolerated the procedure without complication, taken to PACU in good condition.     Alford Highland Jaida Basurto, M.D.     GAR/MEDQ  D:  01/01/2012  T:  01/02/2012  Job:  [id number]

## 2012-01-02 NOTE — Anesthesia Postprocedure Evaluation (Signed)
  Anesthesia Post-op Note  Patient: [name] [name]  Procedure(s) Performed: Procedure(s) (LRB): PARS PLANA VITRECTOMY WITH 20 GAUGE (Right) SILICON OIL REMOVAL (Right) MEMBRANE PEEL (Right)  Patient Location: PACU  Anesthesia Type: General  Level of Consciousness: awake  Airway and Oxygen Therapy: Patient Spontanous Breathing  Post-op Pain: mild  Post-op Assessment: Post-op Vital signs reviewed  Post-op Vital Signs: Reviewed  Complications: No apparent anesthesia complications

## 2012-01-03 ENCOUNTER — Encounter (HOSPITAL_COMMUNITY): Payer: Self-pay | Admitting: Ophthalmology

## 2012-01-06 ENCOUNTER — Encounter: Payer: Self-pay | Admitting: *Deleted

## 2012-02-26 ENCOUNTER — Other Ambulatory Visit: Payer: Self-pay | Admitting: *Deleted

## 2012-02-26 ENCOUNTER — Telehealth: Payer: Self-pay | Admitting: Internal Medicine

## 2012-02-26 MED ORDER — DABIGATRAN ETEXILATE MESYLATE 150 MG PO CAPS: 150.0000 mg | ORAL_CAPSULE | Freq: Two times a day (BID) | ORAL | Status: DC

## 2012-02-26 NOTE — Telephone Encounter (Signed)
Error

## 2012-02-26 NOTE — Telephone Encounter (Signed)
Pt calling needing refill of pradaxa, pt out needs asap, [facility] [place]

## 2012-02-28 MED ORDER — DABIGATRAN ETEXILATE MESYLATE 150 MG PO CAPS: 150.0000 mg | ORAL_CAPSULE | Freq: Two times a day (BID) | ORAL | Status: DC

## 2012-03-01 ENCOUNTER — Other Ambulatory Visit: Payer: Self-pay | Admitting: Family Medicine

## 2012-03-02 ENCOUNTER — Encounter: Payer: Self-pay | Admitting: Family Medicine

## 2012-03-02 NOTE — Telephone Encounter (Signed)
Sent!

## 2012-03-02 NOTE — Telephone Encounter (Signed)
Received refill request electronically from pharmacy. Is it okay to refill medication? 

## 2012-03-03 IMAGING — CR DG CHEST 2V
2 series · 2 of 2 positions shown · non-contrast
Comparison: 10/24/2009 and 07/15/2008.

CLINICAL DATA: AV block.  Post pacemaker placement.

CHEST - 2 VIEW

[w chest pa]
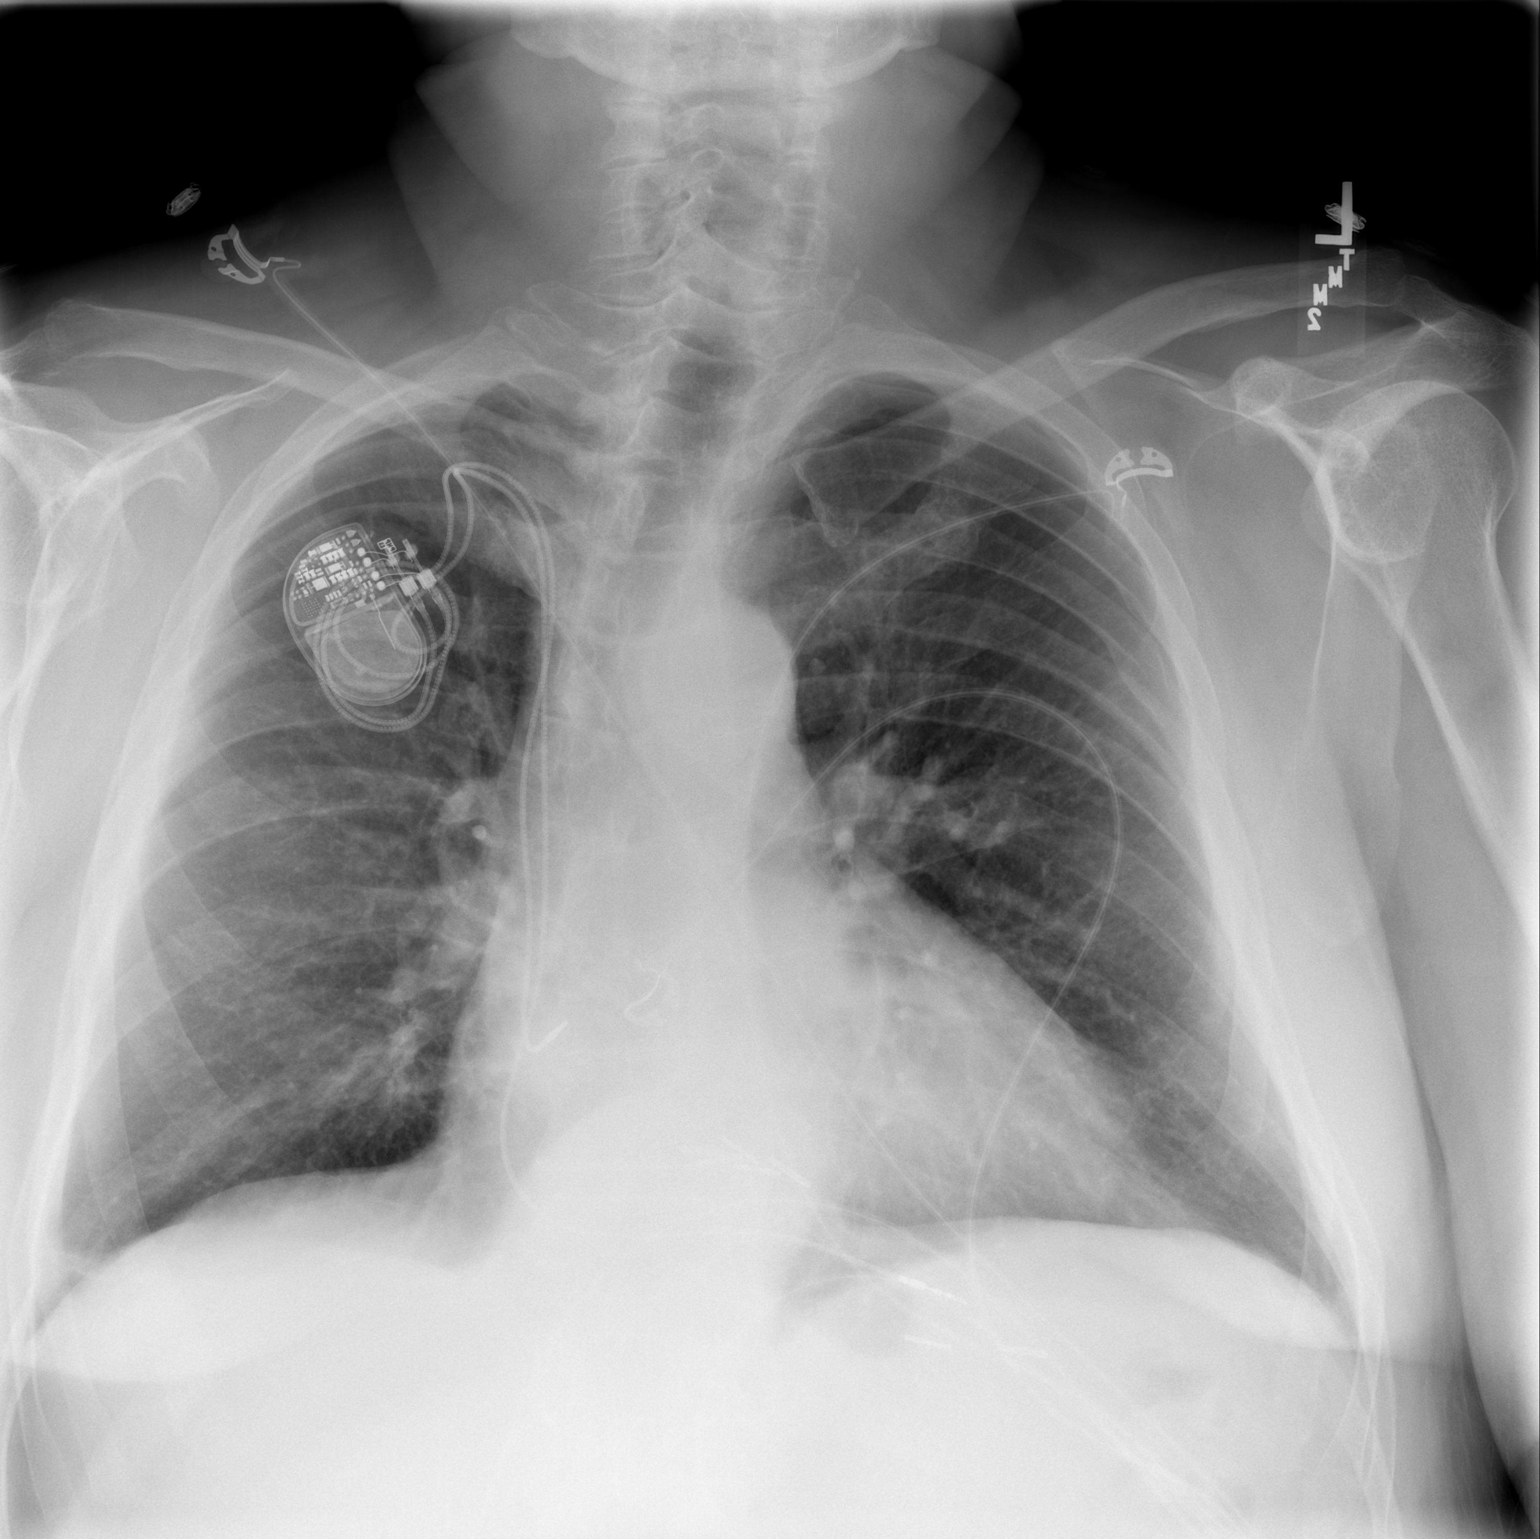

[w chest lat]
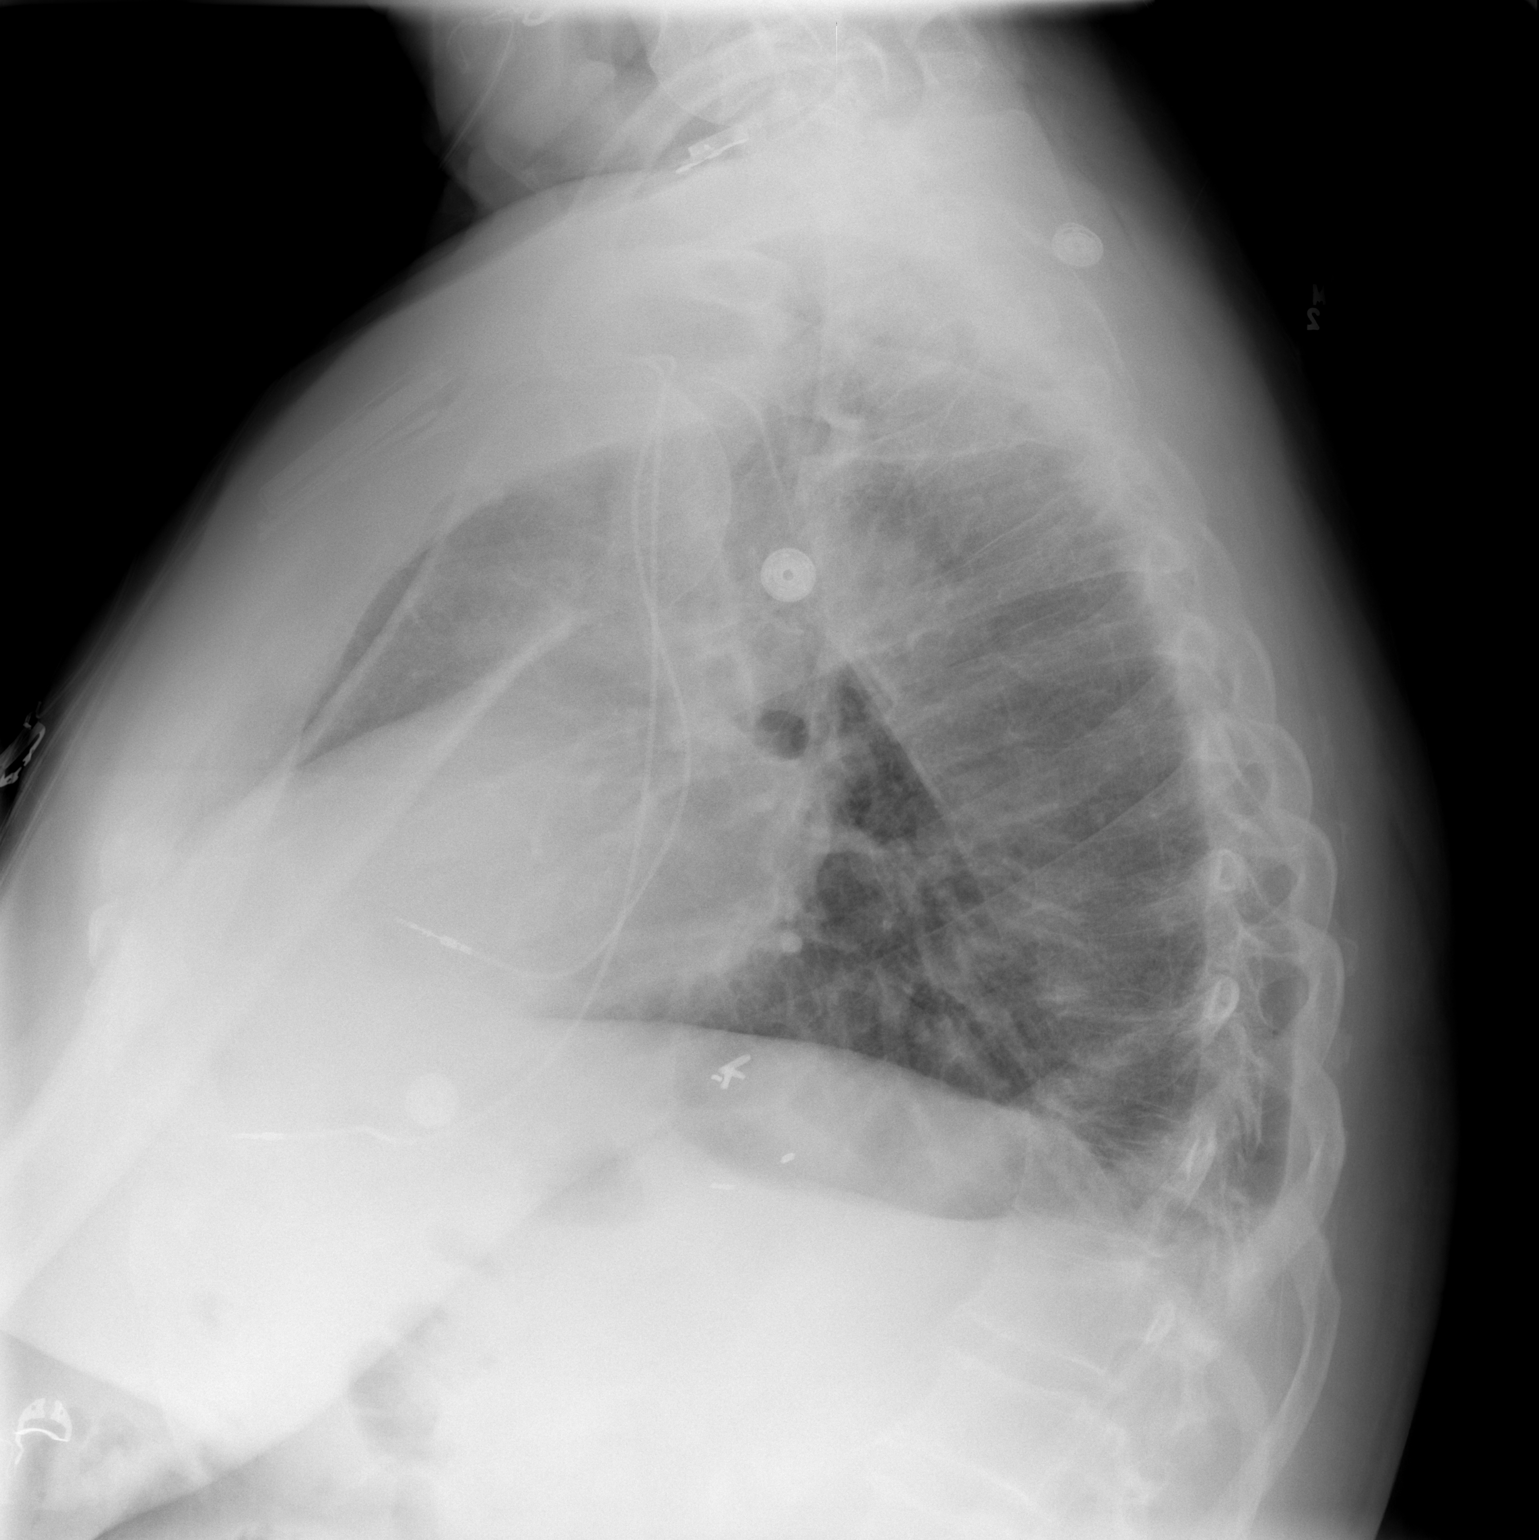

[2 of 2 positions shown; findings below may reference images not displayed]

FINDINGS: New right subclavian AV sequential pacemaker leads extend
into the right atrium and right ventricle.  There is no
pneumothorax.  The heart size and mediastinal contours are stable
with a small hiatal hernia.  The lungs are clear.  There is no
pleural effusion.
IMPRESSION: Interval pacemaker placement without demonstrated complication.  No
acute cardiopulmonary process.

## 2012-03-09 ENCOUNTER — Ambulatory Visit (INDEPENDENT_AMBULATORY_CARE_PROVIDER_SITE_OTHER): Payer: 59 | Admitting: Internal Medicine

## 2012-03-09 ENCOUNTER — Encounter: Payer: Self-pay | Admitting: Internal Medicine

## 2012-03-09 VITALS — BP 128/76 | HR 76 | Resp 18 | Ht 72.0 in | Wt 291.4 lb

## 2012-03-09 DIAGNOSIS — I498 Other specified cardiac arrhythmias: Secondary | ICD-10-CM

## 2012-03-09 DIAGNOSIS — I10 Essential (primary) hypertension: Secondary | ICD-10-CM

## 2012-03-09 DIAGNOSIS — E785 Hyperlipidemia, unspecified: Secondary | ICD-10-CM

## 2012-03-09 DIAGNOSIS — I2510 Atherosclerotic heart disease of native coronary artery without angina pectoris: Secondary | ICD-10-CM

## 2012-03-09 DIAGNOSIS — I4891 Unspecified atrial fibrillation: Secondary | ICD-10-CM

## 2012-03-09 LAB — PACEMAKER DEVICE OBSERVATION
ATRIAL PACING PM: 0.94
BAMS-0001: 170 {beats}/min
BATTERY VOLTAGE: 2.99 V
BRDY-0002RV: 60 {beats}/min
BRDY-0003RV: 130 {beats}/min
BRDY-0004RV: 130 {beats}/min
VENTRICULAR PACING PM: 88.69

## 2012-03-09 NOTE — Progress Notes (Signed)
PCP: Crawford Givens, MD  The patient presents today for routine electrophysiology followup.  Since last being seen in our clinic, the patient reports doing very well.   He remains quite active.  He is trying to drink less ETOH.  He reports compliance with CPAP.  Today, he denies symptoms of palpitations, chest pain, shortness of breath, orthopnea, PND, lower extremity edema, dizziness, presyncope, syncope, or neurologic sequela.  He has fatigue mid day and feels better if he takes a nap in the afternoon. The patient feels that he is tolerating medications without difficulties and is otherwise without complaint today.   Past Medical History  Diagnosis Date  . CAD (coronary artery disease)     s/p PCI 2003. Last stress test in Feb 2011 was normal  . Persistent atrial fibrillation     on Multaq and Pradaxa  . HTN (hypertension)   . ETOH abuse   . DJD (degenerative joint disease)     s/p Left TKA 06/2008  . Metabolic syndrome   . Hematuria   . Psychosexual dysfunction with inhibited sexual excitement   . Herpes zoster without mention of complication   . Hypertrophy of prostate without urinary obstruction and other lower urinary tract symptoms (LUTS)   . Anxiety state, unspecified   . Allergic rhinitis, cause unspecified   . OSA on CPAP     compliant with CPAP  . Obesity   . Hyperlipidemia   . GERD (gastroesophageal reflux disease)   . Sick sinus syndrome     s/p PPM by Dr Deborah Chalk  . Atrial flutter     s/p atrial flutter CTI ablation 10/26/10  . Hiatal hernia   . Gout   . Chronic kidney disease     Kidney Stones   Past Surgical History  Procedure Date  . Esophagogastroduodenoscopy 1992    Dr. Doreatha Martin  . Hiatal hernia repair 04/1998  . Balloon angioplasty, artery 2003    Ostium of the 2nd DX  . Cholecystectomy 1999    Dr. Luan Pulling  . Basal cell carcinoma excision 12/2003    On back  . Total knee arthroplasty 06/2008    Dr. Despina Hick  . Pacemaker insertion 10/2009    [employer]  .  Atrial flutter ablation 10/26/10  . Appendectomy   . Cystoscopy     stone removal  . Pars plana vitrectomy 01/01/2012    Procedure: PARS PLANA VITRECTOMY WITH 20 GAUGE;  Surgeon: Edmon Crape, MD;  Location: [facility] OR;  Service: Ophthalmology;  Laterality: Right;  VITRECTOMY 25G, REMOVE SILICONE OIL, INJECTION OF SILICONE OIL 5000 TO RIGHT EYE, MEMBRANE PEEL, REPAIR COMPLEX RETINAL DETACHMENT, REMOVAL NON MAGNETIC FOREIGN BODY.  . Silicon oil removal 01/01/2012    Procedure: SILICON OIL REMOVAL;  Surgeon: Edmon Crape, MD;  Location: [facility] OR;  Service: Ophthalmology;  Laterality: Right;  INSERT SILICONE OIL    Current Outpatient Prescriptions  Medication Sig Dispense Refill  . amLODipine (NORVASC) 5 MG tablet Take 1 tablet (5 mg total) by mouth 2 (two) times daily.  60 tablet  6  . benazepril (LOTENSIN) 40 MG tablet Take 1 tablet (40 mg total) by mouth 2 (two) times daily.  60 tablet  4  . colchicine 0.6 MG tablet Take 0.6 mg by mouth daily as needed. For gout flare up      . dabigatran (PRADAXA) 150 MG CAPS Take 1 capsule (150 mg total) by mouth every 12 (twelve) hours.  60 capsule  6  . doxazosin (CARDURA) 4 MG tablet  Take 4 mg by mouth daily.      . finasteride (PROSCAR) 5 MG tablet Take 5 mg by mouth daily.        . fish oil-omega-3 fatty acids 1000 MG capsule Take 1 g by mouth daily.       Marland Kitchen FLUoxetine (PROZAC) 20 MG capsule TAKE ONE CAPSULE BY MOUTH EVERY DAY  30 capsule  6  . hydrochlorothiazide (HYDRODIURIL) 25 MG tablet Take 25 mg by mouth daily.      . Melatonin 10 MG TABS Take 10 mg by mouth daily.      . Multiple Vitamin (MULITIVITAMIN WITH MINERALS) TABS Take 1 tablet by mouth daily.      Marland Kitchen OVER THE COUNTER MEDICATION Take 300 mg by mouth daily.      Marland Kitchen oxybutynin (DITROPAN XL) 15 MG 24 hr tablet Take 15 mg by mouth daily.        Marland Kitchen oxyCODONE-acetaminophen (PERCOCET) 5-325 MG per tablet Take 1-2 tablets by mouth every 6 (six) hours as needed for pain.  20 tablet  0  . pravastatin  (PRAVACHOL) 40 MG tablet Take 40 mg by mouth daily.      . cycloSPORINE (RESTASIS) 0.05 % ophthalmic emulsion Apply 1 drop to eye as needed.       . famciclovir (FAMVIR) 500 MG tablet Take 500 mg by mouth as needed.        Allergies  Allergen Reactions  . Escitalopram Oxalate     REACTION: no ejaculation  . Morphine Sulfate     REACTION: unspecified    History   Social History  . Marital Status: Married    Spouse Name: N/A    Number of Children: 2  . Years of Education: N/A   Occupational History  . TRUCK DRIVER Counselling psychologist   Social History Main Topics  . Smoking status: Former Smoker -- 30 years    Quit date: 08/26/1990  . Smokeless tobacco: Never Used  . Alcohol Use: 7.2 oz/week    12 Cans of beer per week     moderate ETOH use, only drinks on weekends  . Drug Use: No  . Sexually Active: Not on file   Other Topics Concern  . Not on file   Social History Narrative   2 caffeine drinks daily     Family History  Problem Relation Age of Onset  . Bone cancer Mother   . Dementia Mother   . Breast cancer Mother     metastatic   . Heart attack Father   . Hypertension Father   . Aneurysm Sister   . Colon cancer Neg Hx   . Anesthesia problems Neg Hx    Physical Exam: Filed Vitals:   03/09/12 0921  BP: 128/76  Pulse: 76  Resp: 18  Height: 6' (1.829 m)  Weight: 291 lb 6.4 oz (132.178 kg)  SpO2: 98%    GEN- The patient is well appearing, alert and oriented x 3 today.   Head- normocephalic, atraumatic Eyes-  Sclera clear, conjunctiva pink Ears- hearing intact Oropharynx- clear Neck- supple, no JVP Lymph- no cervical lymphadenopathy Lungs- Clear to ausculation bilaterally, normal work of breathing Chest- pacemaker pocket is well healed Heart- irregular rate and rhythm GI- soft, NT, ND, + BS Extremities- no clubbing, cyanosis, or edema MS- no significant deformity or atrophy Skin- no rash or lesion Psych- euthymic mood, full  affect Neuro- strength and sensation are intact  Pacemaker interrogation- reviewed in detail today,  See PACEART report  Assessment and Plan:

## 2012-03-09 NOTE — Assessment & Plan Note (Signed)
Stable No change required today  

## 2012-03-09 NOTE — Patient Instructions (Addendum)
Your physician wants you to follow-up in:6 months with [name] [name] 12 months with [name] [name] will receive a reminder letter in the mail two months in advance. If you don't receive a letter, please call our office to schedule the follow-up appointment.  Remote monitoring is used to monitor your Pacemaker of ICD from home. This monitoring reduces the number of office visits required to check your device to one time per year. It allows [place] to keep an eye on the functioning of your device to ensure it is working properly. You are scheduled for a device check from home on 06/15/2012. You may send your transmission at any time that day. If you have a wireless device, the transmission will be sent automatically. After your physician reviews your transmission, you will receive a postcard with your next transmission date.   Your physician has requested that you have an echocardiogram. Echocardiography is a painless test that uses sound waves to create images of your heart. It provides your doctor with information about the size and shape of your heart and how well your heart's chambers and valves are working. This procedure takes approximately one hour. There are no restrictions for this procedure.---TODAY if possible

## 2012-03-09 NOTE — Assessment & Plan Note (Signed)
He is not fasting today.  He requests that Dr Para March repeat lipids upon annual exam.  He will need BMET and BMET on pradaxa at that time also.

## 2012-03-09 NOTE — Assessment & Plan Note (Signed)
afib has progressed to persistent over the past year.  V rates are controlled.  He is on pradaxa.  Therapeutic strategies for afib including rate and rhythm control were discussed in detail with the patient today.  I discussed antiarrhythmic options as well as referral to [facility] for the convergent procedure.  At this time, he would like to continue rate control. I will reprogram his pacemaker VVIR today. In addition, I will obtain an echo to evaluate for structural changes.

## 2012-03-09 NOTE — Assessment & Plan Note (Signed)
No ischemic symptoms No changes today 

## 2012-03-09 NOTE — Assessment & Plan Note (Signed)
V paced 88% Normal pacemaker function See Pace Art report Programmed VVIR today as his afib is likely going to be permanent without AAD or abation.

## 2012-03-13 ENCOUNTER — Ambulatory Visit (HOSPITAL_COMMUNITY): Payer: 59 | Attending: Cardiovascular Disease | Admitting: Radiology

## 2012-03-13 DIAGNOSIS — E785 Hyperlipidemia, unspecified: Secondary | ICD-10-CM | POA: Insufficient documentation

## 2012-03-13 DIAGNOSIS — I059 Rheumatic mitral valve disease, unspecified: Secondary | ICD-10-CM | POA: Insufficient documentation

## 2012-03-13 DIAGNOSIS — I517 Cardiomegaly: Secondary | ICD-10-CM | POA: Insufficient documentation

## 2012-03-13 DIAGNOSIS — Z87891 Personal history of nicotine dependence: Secondary | ICD-10-CM | POA: Insufficient documentation

## 2012-03-13 DIAGNOSIS — I10 Essential (primary) hypertension: Secondary | ICD-10-CM | POA: Insufficient documentation

## 2012-03-13 DIAGNOSIS — M1990 Unspecified osteoarthritis, unspecified site: Secondary | ICD-10-CM | POA: Insufficient documentation

## 2012-03-13 DIAGNOSIS — I4891 Unspecified atrial fibrillation: Secondary | ICD-10-CM

## 2012-03-13 DIAGNOSIS — I2510 Atherosclerotic heart disease of native coronary artery without angina pectoris: Secondary | ICD-10-CM | POA: Insufficient documentation

## 2012-03-13 DIAGNOSIS — I498 Other specified cardiac arrhythmias: Secondary | ICD-10-CM | POA: Insufficient documentation

## 2012-03-13 DIAGNOSIS — G4730 Sleep apnea, unspecified: Secondary | ICD-10-CM | POA: Insufficient documentation

## 2012-03-13 DIAGNOSIS — E669 Obesity, unspecified: Secondary | ICD-10-CM | POA: Insufficient documentation

## 2012-03-13 NOTE — Progress Notes (Signed)
Echocardiogram performed.  

## 2012-03-19 ENCOUNTER — Telehealth: Payer: Self-pay | Admitting: *Deleted

## 2012-03-19 NOTE — Telephone Encounter (Signed)
Message copied by Deliah Boston on Thu Mar 19, 2012 10:35 AM ------      Message from: Hillis Range      Created: Mon Mar 16, 2012 10:17 PM       Results reviewed.  Please inform pt of result.      EF is mildly depressed.  We will follow this.

## 2012-03-27 ENCOUNTER — Other Ambulatory Visit: Payer: Self-pay

## 2012-03-27 MED ORDER — PRAVASTATIN SODIUM 40 MG PO TABS: 40.0000 mg | ORAL_TABLET | Freq: Every day | ORAL | Status: DC

## 2012-04-13 ENCOUNTER — Telehealth: Payer: Self-pay | Admitting: Internal Medicine

## 2012-04-14 NOTE — Telephone Encounter (Signed)
Close  

## 2012-04-28 ENCOUNTER — Other Ambulatory Visit: Payer: Self-pay | Admitting: Internal Medicine

## 2012-04-28 MED ORDER — BENAZEPRIL HCL 40 MG PO TABS: 40.0000 mg | ORAL_TABLET | Freq: Two times a day (BID) | ORAL | Status: DC

## 2012-05-20 ENCOUNTER — Other Ambulatory Visit: Payer: Self-pay | Admitting: Family Medicine

## 2012-05-20 DIAGNOSIS — I10 Essential (primary) hypertension: Secondary | ICD-10-CM

## 2012-05-27 ENCOUNTER — Other Ambulatory Visit: Payer: Self-pay | Admitting: Family Medicine

## 2012-05-27 ENCOUNTER — Other Ambulatory Visit (INDEPENDENT_AMBULATORY_CARE_PROVIDER_SITE_OTHER): Payer: Medicare Other

## 2012-05-27 DIAGNOSIS — N400 Enlarged prostate without lower urinary tract symptoms: Secondary | ICD-10-CM

## 2012-05-27 DIAGNOSIS — Z125 Encounter for screening for malignant neoplasm of prostate: Secondary | ICD-10-CM

## 2012-05-27 DIAGNOSIS — I10 Essential (primary) hypertension: Secondary | ICD-10-CM

## 2012-05-27 LAB — LIPID PANEL
HDL: 28.6 mg/dL — ABNORMAL LOW (ref >39.00)
LDL Cholesterol: 55 mg/dL (ref 0–99)
Total CHOL/HDL Ratio: 4
VLDL: 17.2 mg/dL (ref 0.0–40.0)

## 2012-05-27 LAB — COMPREHENSIVE METABOLIC PANEL
AST: 28 U/L (ref 0–37)
Alkaline Phosphatase: 58 U/L (ref 39–117)
Glucose, Bld: 116 mg/dL — ABNORMAL HIGH (ref 70–99)
Sodium: 140 mEq/L (ref 135–145)
Total Bilirubin: 1.5 mg/dL — ABNORMAL HIGH (ref 0.3–1.2)
Total Protein: 7.3 g/dL (ref 6.0–8.3)

## 2012-05-27 LAB — TSH: TSH: 1.58 u[IU]/mL (ref 0.35–5.50)

## 2012-05-27 NOTE — Addendum Note (Signed)
Addended by: Alvina Chou on: 05/27/2012 12:11 PM   Modules accepted: Orders

## 2012-05-29 ENCOUNTER — Ambulatory Visit (INDEPENDENT_AMBULATORY_CARE_PROVIDER_SITE_OTHER): Payer: 59 | Admitting: Family Medicine

## 2012-05-29 ENCOUNTER — Other Ambulatory Visit: Payer: Self-pay | Admitting: *Deleted

## 2012-05-29 ENCOUNTER — Encounter: Payer: Self-pay | Admitting: Family Medicine

## 2012-05-29 VITALS — BP 124/70 | HR 92 | Temp 98.1°F | Ht 72.0 in | Wt 293.0 lb

## 2012-05-29 DIAGNOSIS — R739 Hyperglycemia, unspecified: Secondary | ICD-10-CM

## 2012-05-29 DIAGNOSIS — Z0000 Encounter for general adult medical examination without abnormal findings: Secondary | ICD-10-CM

## 2012-05-29 DIAGNOSIS — E785 Hyperlipidemia, unspecified: Secondary | ICD-10-CM

## 2012-05-29 DIAGNOSIS — I4891 Unspecified atrial fibrillation: Secondary | ICD-10-CM

## 2012-05-29 DIAGNOSIS — R454 Irritability and anger: Secondary | ICD-10-CM

## 2012-05-29 DIAGNOSIS — R7309 Other abnormal glucose: Secondary | ICD-10-CM

## 2012-05-29 MED ORDER — OXYCODONE-ACETAMINOPHEN 5-325 MG PO TABS: 1.0000 | ORAL_TABLET | Freq: Four times a day (QID) | ORAL | Status: DC | PRN

## 2012-05-29 MED ORDER — FLUOXETINE HCL 20 MG PO CAPS: ORAL_CAPSULE | ORAL | Status: DC

## 2012-05-29 MED ORDER — FAMCICLOVIR 500 MG PO TABS: 1500.0000 mg | ORAL_TABLET | Freq: Every day | ORAL | Status: DC | PRN

## 2012-05-29 MED ORDER — COLCHICINE 0.6 MG PO TABS: 0.6000 mg | ORAL_TABLET | Freq: Every day | ORAL | Status: DC | PRN

## 2012-05-29 NOTE — Progress Notes (Signed)
I have personally reviewed the Medicare Annual Wellness questionnaire and have noted 1. The patient's medical and social history 2. Their use of alcohol, tobacco or illicit drugs 3. Their current medications and supplements 4. The patient's functional ability including ADL's, fall risks, home safety risks and hearing or visual             impairment. 5. Diet and physical activities 6. Evidence for depression or mood disorders  The patients weight, height, BMI have been recorded in the chart and visual acuity is per eye clinic.  I have made referrals, counseling and provided education to the patient based review of the above and I have provided the pt with a written personalized care plan for preventive services.  See scanned forms.  Routine anticipatory guidance given to patient.  See health maintenance. Tetanus2009 Flu 2013 Shingles 2012 PNA 2007 Colonoscopy 2012 Prostate cancer screening d/w pt- PSA wnl.  Advance directive d/w pt.  He'll consider.    Gout.  Controlled with rare flares.  Uses oxycodone when he does have a flare.  Needs refill.  Overall doing well.   PAF.  No CP, SOB, sig palpitations.  Feeling well.  Compliant with meds.    Irritability. Controlled with current dose of prozac.  Mood is good, affect bright, no ADE from meds. Wants to continue.  HLD.  Labs and weight d/w pt.  Tolerating meds.  No ADE.    Hyperglycemia.  Labs and weight d/w pt.  No meds.   Diet discussed. He's cutting back on beer and this should help some.    PMH and SH reviewed  Meds, vitals, and allergies reviewed.   ROS: See HPI.  Otherwise negative.    GEN: nad, alert and oriented HEENT: mucous membranes moist NECK: supple w/o LA CV: rrr. Pacer site well healed PULM: ctab, no inc wob ABD: soft, +bs EXT: no edema SKIN: no acute rash

## 2012-05-29 NOTE — Patient Instructions (Addendum)
Don't change your meds.  Take care. Glad to see you.  Recheck in year.  Keep working on your weight.

## 2012-05-31 DIAGNOSIS — R739 Hyperglycemia, unspecified: Secondary | ICD-10-CM | POA: Insufficient documentation

## 2012-05-31 NOTE — Assessment & Plan Note (Signed)
Continue current meds.  Labs discussed.  He'll work on [employer], diet.

## 2012-05-31 NOTE — Assessment & Plan Note (Signed)
Controlled, continue current meds.  He agrees.

## 2012-05-31 NOTE — Assessment & Plan Note (Signed)
See scanned forms.  Routine anticipatory guidance given to patient.  See health maintenance. Tetanus2009 Flu 2013 Shingles 2012 PNA 2007 Colonoscopy 2012 Prostate cancer screening d/w pt- PSA wnl.  Advance directive d/w pt.  He'll consider.

## 2012-05-31 NOTE — Assessment & Plan Note (Signed)
Sound regular today, continue current meds.  Feels well.

## 2012-05-31 NOTE — Assessment & Plan Note (Signed)
With occ oxycodone use.  Continue as is.

## 2012-05-31 NOTE — Assessment & Plan Note (Signed)
Labs discussed.  He'll work on [employer], diet.

## 2012-06-15 ENCOUNTER — Encounter: Payer: 59 | Admitting: *Deleted

## 2012-06-19 ENCOUNTER — Encounter: Payer: Self-pay | Admitting: *Deleted

## 2012-06-24 ENCOUNTER — Ambulatory Visit (INDEPENDENT_AMBULATORY_CARE_PROVIDER_SITE_OTHER): Payer: 59 | Admitting: *Deleted

## 2012-06-24 DIAGNOSIS — I4891 Unspecified atrial fibrillation: Secondary | ICD-10-CM

## 2012-07-07 LAB — REMOTE PACEMAKER DEVICE
BMOD-0002RV: 7
BMOD-0004RV: 5
VENTRICULAR PACING PM: 90.49

## 2012-07-15 ENCOUNTER — Encounter: Payer: Self-pay | Admitting: Cardiology

## 2012-07-21 ENCOUNTER — Encounter: Payer: Self-pay | Admitting: *Deleted

## 2012-07-27 ENCOUNTER — Encounter: Payer: Self-pay | Admitting: Internal Medicine

## 2012-09-07 ENCOUNTER — Encounter: Payer: Self-pay | Admitting: Nurse Practitioner

## 2012-09-07 ENCOUNTER — Ambulatory Visit (INDEPENDENT_AMBULATORY_CARE_PROVIDER_SITE_OTHER): Payer: 59 | Admitting: Nurse Practitioner

## 2012-09-07 VITALS — BP 140/82 | HR 72 | Ht 72.0 in | Wt 300.8 lb

## 2012-09-07 DIAGNOSIS — I4891 Unspecified atrial fibrillation: Secondary | ICD-10-CM

## 2012-09-07 NOTE — Patient Instructions (Addendum)
I think you are doing pretty good  Try to lose some weight. Get back to riding your bike!!  Dr. Johney Frame will see you in July  Call the [facility] office at [phone] if you have any questions, problems or concerns.

## 2012-09-07 NOTE — Progress Notes (Signed)
[name] [name] [name] [name] Date of Birth: 28-Jul-1941 Medical Record #[id number]  History of Present Illness: "[name] [name]" is seen today for [name] follow up visit. He is seen for Dr. Johney Frame. He has multiple issues which are as noted below. He was last here in July and seemed to be doing ok. He is now in chronic atrial fib, has his pacemaker programmed VVIR and remains on anticoagulation with Pradaxa.   He comes in today. He is here alone. He is doing ok. Feels pretty good. Unfortunately, he has gained weight. No longer has his stationary bike so he has stopped exercising. Feels ok. No chest pain. Not short of breath unless he really overexerts himself. Tolerating his medicines. BP running 125 to 140 systolic at home. Remains in chronic atrial fib. Not interested in other options for his atrial fib.  Current Outpatient Prescriptions on File Prior to Visit  Medication Sig Dispense Refill  . amLODipine (NORVASC) 5 MG tablet Take 1 tablet (5 mg total) by mouth 2 (two) times daily.  60 tablet  6  . benazepril (LOTENSIN) 40 MG tablet Take 1 tablet (40 mg total) by mouth 2 (two) times daily.  60 tablet  4  . colchicine 0.6 MG tablet Take 1 tablet (0.6 mg total) by mouth daily as needed. For gout flare up  30 tablet  12  . dabigatran (PRADAXA) 150 MG CAPS Take 1 capsule (150 mg total) by mouth every 12 (twelve) hours.  60 capsule  6  . doxazosin (CARDURA) 4 MG tablet Take 4 mg by mouth daily.      . famciclovir (FAMVIR) 500 MG tablet Take 3 tablets (1,500 mg total) by mouth daily as needed.  90 tablet  3  . finasteride (PROSCAR) 5 MG tablet Take 5 mg by mouth daily.        Marland Kitchen FLUoxetine (PROZAC) 20 MG capsule TAKE ONE CAPSULE BY MOUTH EVERY DAY  90 capsule  3  . hydrochlorothiazide (HYDRODIURIL) 25 MG tablet Take 25 mg by mouth daily.      . Multiple Vitamin (MULITIVITAMIN WITH MINERALS) TABS Take 1 tablet by mouth daily.      Marland Kitchen oxybutynin (DITROPAN XL) 15 MG 24 hr tablet Take 15 mg by mouth daily.        Marland Kitchen  oxyCODONE-acetaminophen (PERCOCET) 5-325 MG per tablet Take 1-2 tablets by mouth every 6 (six) hours as needed for pain.  30 tablet  0  . pravastatin (PRAVACHOL) 40 MG tablet Take 1 tablet (40 mg total) by mouth daily.  30 tablet  6    Allergies  Allergen Reactions  . Escitalopram Oxalate     REACTION: no ejaculation  . Morphine Sulfate     REACTION: unspecified    Past Medical History  Diagnosis Date  . CAD (coronary artery disease)     s/p PCI 2003. Last stress test in Feb 2011 was normal  . Persistent atrial fibrillation     on Multaq and Pradaxa  . HTN (hypertension)   . ETOH abuse   . DJD (degenerative joint disease)     s/p Left TKA 06/2008  . Metabolic syndrome   . Hematuria   . Psychosexual dysfunction with inhibited sexual excitement   . Herpes zoster without mention of complication   . Hypertrophy of prostate without urinary obstruction and other lower urinary tract symptoms (LUTS)   . Anxiety state, unspecified   . Allergic rhinitis, cause unspecified   . OSA on CPAP     compliant with CPAP  .  Obesity   . Hyperlipidemia   . GERD (gastroesophageal reflux disease)   . Sick sinus syndrome     s/p PPM by Dr Deborah Chalk  . Atrial flutter     s/p atrial flutter CTI ablation 10/26/10  . Hiatal hernia   . Gout   . Chronic kidney disease     Kidney Stones    Past Surgical History  Procedure Date  . Esophagogastroduodenoscopy 1992    Dr. Doreatha Martin  . Hiatal hernia repair 04/1998  . Balloon angioplasty, artery 2003    Ostium of the 2nd DX  . Cholecystectomy 1999    Dr. Luan Pulling  . Basal cell carcinoma excision 12/2003    On back  . Total knee arthroplasty 06/2008    Dr. Despina Hick  . Pacemaker insertion 10/2009    Medtronic  . Atrial flutter ablation 10/26/10  . Appendectomy   . Cystoscopy     stone removal  . Pars plana vitrectomy 01/01/2012    Procedure: PARS PLANA VITRECTOMY WITH 20 GAUGE;  Surgeon: Edmon Crape, MD;  Location: [facility] OR;  Service: Ophthalmology;   Laterality: Right;  VITRECTOMY 25G, REMOVE SILICONE OIL, INJECTION OF SILICONE OIL 5000 TO RIGHT EYE, MEMBRANE PEEL, REPAIR COMPLEX RETINAL DETACHMENT, REMOVAL NON MAGNETIC FOREIGN BODY.  . Silicon oil removal 01/01/2012    Procedure: SILICON OIL REMOVAL;  Surgeon: Edmon Crape, MD;  Location: [facility] OR;  Service: Ophthalmology;  Laterality: Right;  INSERT SILICONE OIL    History  Smoking status  . Former Smoker -- 30 years  . Quit date: 08/26/1990  Smokeless tobacco  . Never Used    History  Alcohol Use  . 3.6 oz/week  . 6 Cans of beer per week    Comment: moderate ETOH use, only drinks on weekends    Family History  Problem Relation Age of Onset  . Bone cancer Mother   . Dementia Mother   . Breast cancer Mother     metastatic   . Heart attack Father   . Hypertension Father   . Aneurysm Sister   . Colon cancer Neg Hx   . Anesthesia problems Neg Hx     Review of Systems: The review of systems is per the HPI.  All other systems were reviewed and are negative.  Physical Exam: BP 140/82  Pulse 72  Ht 6' (1.829 m)  Wt 300 lb 12.8 oz (136.442 kg)  BMI 40.80 kg/m2 Weight is up 9 more pounds today. Patient is very pleasant and in no acute distress. He is obese. Skin is warm and dry. Color is normal.  HEENT is unremarkable. Normocephalic/atraumatic. PERRL. Sclera are nonicteric. Neck is supple. No masses. No JVD. Lungs are clear. Heart tones are distant. Abdomen is obese but soft. Extremities are without edema. Gait and ROM are intact. No gross neurologic deficits noted.   LABORATORY DATA:  Lab Results  Component Value Date   WBC 6.4 01/01/2012   HGB 15.5 01/01/2012   HCT 43.3 01/01/2012   PLT 192 01/01/2012   GLUCOSE 116* 05/27/2012   CHOL 101 05/27/2012   TRIG 86.0 05/27/2012   HDL 28.60* 05/27/2012   LDLDIRECT 51.8 11/28/2010   LDLCALC 55 05/27/2012   ALT 29 05/27/2012   AST 28 05/27/2012   NA 140 05/27/2012   K 3.9 05/27/2012   CL 102 05/27/2012   CREATININE 1.1 05/27/2012   BUN  17 05/27/2012   CO2 28 05/27/2012   TSH 1.58 05/27/2012   PSA 0.30 05/27/2012  INR 1.43 10/26/2010   Echo Study Conclusions from July 2013  - Left ventricle: Septal and apical hypokinesis The cavity size was normal. Wall thickness was increased in [name] pattern of mild LVH. Systolic function was mildly reduced. The estimated ejection fraction was in the range of 45% to 50%. - Mitral valve: Mild regurgitation. - Left atrium: The atrium was moderately dilated. - Right atrium: The atrium was mildly dilated. - Atrial septum: No defect or patent foramen ovale was identified. - Pericardium, extracardiac: [name] trivial pericardial effusion was identified.  Assessment / Plan:  1. Chronic atrial fib - managed with rate control and anticoagualation - doing well. He is not interested in other options and will continue with his current plan. He sees Dr. Johney Frame in July.  2. HTN - blood pressure is fairly well controlled at home.  3. Obesity - he understands the need to get back on track with his diet/exercise/weight loss.   4. Underlying pacemaker - for phone check next month.   Call the [facility] office at [phone] if you have any questions, problems or concerns.

## 2012-09-22 ENCOUNTER — Other Ambulatory Visit: Payer: Self-pay | Admitting: *Deleted

## 2012-09-22 MED ORDER — DABIGATRAN ETEXILATE MESYLATE 150 MG PO CAPS: 150.0000 mg | ORAL_CAPSULE | Freq: Two times a day (BID) | ORAL | Status: DC

## 2012-09-22 MED ORDER — AMLODIPINE BESYLATE 5 MG PO TABS: 5.0000 mg | ORAL_TABLET | Freq: Two times a day (BID) | ORAL | Status: DC

## 2012-09-29 ENCOUNTER — Other Ambulatory Visit: Payer: Self-pay | Admitting: Emergency Medicine

## 2012-09-29 MED ORDER — BENAZEPRIL HCL 40 MG PO TABS: 40.0000 mg | ORAL_TABLET | Freq: Two times a day (BID) | ORAL | Status: DC

## 2012-10-05 ENCOUNTER — Encounter: Payer: 59 | Admitting: *Deleted

## 2012-10-12 ENCOUNTER — Other Ambulatory Visit: Payer: Self-pay | Admitting: Internal Medicine

## 2012-10-12 ENCOUNTER — Encounter: Payer: Self-pay | Admitting: Internal Medicine

## 2012-10-12 ENCOUNTER — Encounter: Payer: Self-pay | Admitting: *Deleted

## 2012-10-12 ENCOUNTER — Ambulatory Visit (INDEPENDENT_AMBULATORY_CARE_PROVIDER_SITE_OTHER): Payer: 59 | Admitting: *Deleted

## 2012-10-12 DIAGNOSIS — Z950 Presence of cardiac pacemaker: Secondary | ICD-10-CM

## 2012-10-12 DIAGNOSIS — I498 Other specified cardiac arrhythmias: Secondary | ICD-10-CM

## 2012-10-18 LAB — REMOTE PACEMAKER DEVICE
BMOD-0004RV: 5
VENTRICULAR PACING PM: 92.17

## 2012-10-21 ENCOUNTER — Encounter: Payer: Self-pay | Admitting: Cardiology

## 2012-10-27 ENCOUNTER — Encounter: Payer: Self-pay | Admitting: Family Medicine

## 2012-10-27 ENCOUNTER — Other Ambulatory Visit: Payer: Self-pay | Admitting: Emergency Medicine

## 2012-10-27 ENCOUNTER — Ambulatory Visit (INDEPENDENT_AMBULATORY_CARE_PROVIDER_SITE_OTHER): Payer: 59 | Admitting: Family Medicine

## 2012-10-27 VITALS — BP 142/86 | HR 89 | Temp 98.1°F | Wt 298.0 lb

## 2012-10-27 DIAGNOSIS — G4700 Insomnia, unspecified: Secondary | ICD-10-CM

## 2012-10-27 MED ORDER — PRAVASTATIN SODIUM 40 MG PO TABS: 40.0000 mg | ORAL_TABLET | Freq: Every day | ORAL | Status: DC

## 2012-10-27 MED ORDER — ZOLPIDEM TARTRATE 10 MG PO TABS: 10.0000 mg | ORAL_TABLET | Freq: Every evening | ORAL | Status: DC | PRN

## 2012-10-27 NOTE — Assessment & Plan Note (Signed)
He's waking about every hour.  He had used [employer] w/o ADE.  Sx had been going on for years.  He's tried melatonin with transient relief.  He drinks no known caffeine. Good initiation.  Still using CPAP w/o snoring.  D/w pt about Remus Loffler, cautions for parasomnias.  Try for 1-2 weeks, then try to cut back.  He agrees. Sleep hygiene d/w pt.

## 2012-10-27 NOTE — Progress Notes (Signed)
He hasn't had gout trouble as long as is taking aleve periodically.    Trouble sleeping.  He's waking about every hour.  He had used [employer] w/o ADE.  Sx had been going on for years.  He's tried melatonin with transient relief.  He drinks no known caffeine.  Limited sugar in diet, less soda.  He drinks some tea, unsure if it has caffeine (I asked him to check on his brand).  Would usually go to bed at 9AM, gets up at 4AM every day, even on the weekend.  He's using CPAP w/o extra snoring, consistently using it.  Will occ watch TV in bed, recorded material, "something to relax with like a western."  He does well with initiation, but then has trouble with maintenance.   Meds, vitals, and allergies reviewed.   ROS: See HPI.  Otherwise, noncontributory.  nad ncat Affect wnl rrr ctab

## 2012-10-27 NOTE — Patient Instructions (Addendum)
Take 10mg  at night.  Notify the clinic if you have any concerns in the meantime.  Try to cut back to a half tab in about 10-14 days.  Make sure your tea doesn't have caffeine.  If it does, then gradually cut that out.  Take care.

## 2012-11-02 ENCOUNTER — Telehealth: Payer: Self-pay | Admitting: Internal Medicine

## 2012-11-02 ENCOUNTER — Encounter: Payer: Self-pay | Admitting: *Deleted

## 2012-11-02 NOTE — Telephone Encounter (Signed)
He has gotten it straight now.  He was able to pick up medication

## 2012-11-02 NOTE — Telephone Encounter (Signed)
Pt needs plavix refilled and authorized at [facility] [address] and he needs this asap they tried last week and we have not resonded

## 2012-12-15 ENCOUNTER — Other Ambulatory Visit: Payer: Self-pay

## 2012-12-15 MED ORDER — FLUOXETINE HCL 20 MG PO CAPS: ORAL_CAPSULE | ORAL | Status: DC

## 2012-12-15 NOTE — Telephone Encounter (Signed)
pts wife has requested refill from mail order but has not received yet and pts wife request # [phone] sent to [facility] while waiting on mail order rx. Advised done.

## 2012-12-31 ENCOUNTER — Other Ambulatory Visit: Payer: Self-pay | Admitting: Family Medicine

## 2013-01-01 ENCOUNTER — Other Ambulatory Visit: Payer: Self-pay | Admitting: Cardiology

## 2013-01-01 MED ORDER — HYDROCHLOROTHIAZIDE 25 MG PO TABS: 25.0000 mg | ORAL_TABLET | Freq: Every day | ORAL | Status: DC

## 2013-01-24 ENCOUNTER — Other Ambulatory Visit: Payer: Self-pay | Admitting: Family Medicine

## 2013-01-25 NOTE — Telephone Encounter (Signed)
Electronic refill request.  Please advise. 

## 2013-01-25 NOTE — Telephone Encounter (Signed)
Please call in

## 2013-01-26 NOTE — Telephone Encounter (Signed)
Medication phoned to pharmacy.  

## 2013-03-07 ENCOUNTER — Other Ambulatory Visit: Payer: Self-pay | Admitting: Internal Medicine

## 2013-03-08 ENCOUNTER — Encounter: Payer: 59 | Admitting: Internal Medicine

## 2013-03-29 ENCOUNTER — Ambulatory Visit (INDEPENDENT_AMBULATORY_CARE_PROVIDER_SITE_OTHER): Payer: 59 | Admitting: *Deleted

## 2013-03-29 DIAGNOSIS — I498 Other specified cardiac arrhythmias: Secondary | ICD-10-CM

## 2013-03-29 DIAGNOSIS — Z950 Presence of cardiac pacemaker: Secondary | ICD-10-CM

## 2013-04-07 LAB — REMOTE PACEMAKER DEVICE
BRDY-0002RV: 60 {beats}/min
BRDY-0004RV: 130 {beats}/min
VENTRICULAR PACING PM: 92.27

## 2013-04-09 ENCOUNTER — Encounter: Payer: 59 | Admitting: Internal Medicine

## 2013-04-27 ENCOUNTER — Encounter: Payer: Self-pay | Admitting: *Deleted

## 2013-05-05 ENCOUNTER — Telehealth: Payer: Self-pay

## 2013-05-05 MED ORDER — DABIGATRAN ETEXILATE MESYLATE 150 MG PO CAPS: 150.0000 mg | ORAL_CAPSULE | Freq: Two times a day (BID) | ORAL | Status: DC

## 2013-05-05 NOTE — Telephone Encounter (Signed)
pharm called to get a refill on pradaxa i gave 60 tab with 2 refills and to let patient know that he needed an appointment

## 2013-05-12 ENCOUNTER — Encounter: Payer: 59 | Admitting: Internal Medicine

## 2013-05-12 ENCOUNTER — Ambulatory Visit (INDEPENDENT_AMBULATORY_CARE_PROVIDER_SITE_OTHER): Payer: 59 | Admitting: Internal Medicine

## 2013-05-12 ENCOUNTER — Encounter: Payer: Self-pay | Admitting: Internal Medicine

## 2013-05-12 VITALS — BP 171/99 | HR 77 | Ht 72.0 in | Wt 296.0 lb

## 2013-05-12 DIAGNOSIS — R0602 Shortness of breath: Secondary | ICD-10-CM

## 2013-05-12 DIAGNOSIS — I428 Other cardiomyopathies: Secondary | ICD-10-CM

## 2013-05-12 DIAGNOSIS — I4891 Unspecified atrial fibrillation: Secondary | ICD-10-CM

## 2013-05-12 DIAGNOSIS — I498 Other specified cardiac arrhythmias: Secondary | ICD-10-CM

## 2013-05-12 DIAGNOSIS — I2510 Atherosclerotic heart disease of native coronary artery without angina pectoris: Secondary | ICD-10-CM

## 2013-05-12 DIAGNOSIS — I10 Essential (primary) hypertension: Secondary | ICD-10-CM

## 2013-05-12 MED ORDER — CARVEDILOL 6.25 MG PO TABS: 6.2500 mg | ORAL_TABLET | Freq: Two times a day (BID) | ORAL | Status: DC

## 2013-05-12 NOTE — Patient Instructions (Addendum)
Your physician wants you to follow-up in: 4 weeks with [name] [name] and12 months with Dr Jacquiline Doe will receive a reminder letter in the mail two months in advance. If you don't receive a letter, please call our office to schedule the follow-up appointment.   Remote monitoring is used to monitor your Pacemaker or ICD from home. This monitoring reduces the number of office visits required to check your device to one time per year. It allows [place] to keep an eye on the functioning of your device to ensure it is working properly. You are scheduled for a device check from home on 08/16/13. You may send your transmission at any time that day. If you have a wireless device, the transmission will be sent automatically. After your physician reviews your transmission, you will receive a postcard with your next transmission date.  Your physician has requested that you have an echocardiogram. Echocardiography is a painless test that uses sound waves to create images of your heart. It provides your doctor with information about the size and shape of your heart and how well your heart's chambers and valves are working. This procedure takes approximately one hour. There are no restrictions for this procedure.  Your physician has requested that you have a lexiscan myoview. For further information please visit [email or web address] Please follow instruction sheet, as given.  Your physician has recommended you make the following change in your medication:  1) Start Carvedilol 6.25mg  twice daily

## 2013-05-12 NOTE — Progress Notes (Signed)
PCP: Crawford Givens, MD  The patient presents today for routine electrophysiology followup.  Since last being seen in our clinic, the patient reports doing very well.   He remains quite active.  He has begun noticing shortness of breath with exertion.  This is new since his last visit. Today, he denies symptoms of palpitations, chest pain, orthopnea, PND, lower extremity edema, dizziness, presyncope, syncope, or neurologic sequela. The patient feels that he is tolerating medications without difficulties and is otherwise without complaint today.   Past Medical History  Diagnosis Date  . CAD (coronary artery disease)     s/p PCI 2003. Last stress test in Feb 2011 was normal  . Persistent atrial fibrillation     on Multaq and Pradaxa  . HTN (hypertension)   . ETOH abuse   . DJD (degenerative joint disease)     s/p Left TKA 06/2008  . Metabolic syndrome   . Hematuria   . Psychosexual dysfunction with inhibited sexual excitement   . Herpes zoster without mention of complication   . Hypertrophy of prostate without urinary obstruction and other lower urinary tract symptoms (LUTS)   . Anxiety state, unspecified   . Allergic rhinitis, cause unspecified   . OSA on CPAP     compliant with CPAP  . Obesity   . Hyperlipidemia   . GERD (gastroesophageal reflux disease)   . Sick sinus syndrome     s/p PPM by Dr Deborah Chalk  . Atrial flutter     s/p atrial flutter CTI ablation 10/26/10  . Hiatal hernia   . Gout   . Chronic kidney disease     Kidney Stones   Past Surgical History  Procedure Laterality Date  . Esophagogastroduodenoscopy  1992    Dr. Doreatha Martin  . Hiatal hernia repair  04/1998  . Balloon angioplasty, artery  2003    Ostium of the 2nd DX  . Cholecystectomy  1999    Dr. Luan Pulling  . Basal cell carcinoma excision  12/2003    On back  . Total knee arthroplasty  06/2008    Dr. Despina Hick  . Pacemaker insertion  10/2009    [employer]tr[employer]  . Atrial flutter ablation  10/26/10  . Appendectomy    .  Cystoscopy      stone removal  . Pars plana vitrectomy  01/01/2012    Procedure: PARS PLANA VITRECTOMY WITH 20 GAUGE;  Surgeon: Edmon Crape, MD;  Location: [facility] OR;  Service: Ophthalmology;  Laterality: Right;  VITRECTOMY 25G, REMOVE SILICONE OIL, INJECTION OF SILICONE OIL 5000 TO RIGHT EYE, MEMBRANE PEEL, REPAIR COMPLEX RETINAL DETACHMENT, REMOVAL NON MAGNETIC FOREIGN BODY.  . Silicon oil removal  01/01/2012    Procedure: SILICON OIL REMOVAL;  Surgeon: Edmon Crape, MD;  Location: [facility] OR;  Service: Ophthalmology;  Laterality: Right;  INSERT SILICONE OIL    Current Outpatient Prescriptions  Medication Sig Dispense Refill  . amLODipine (NORVASC) 5 MG tablet Take 1 tablet (5 mg total) by mouth 2 (two) times daily.  60 tablet  6  . benazepril (LOTENSIN) 40 MG tablet TAKE 1 TABLET (40 MG TOTAL) BY MOUTH 2 (TWO) TIMES DAILY.  60 tablet  4  . colchicine 0.6 MG tablet Take 1 tablet (0.6 mg total) by mouth daily as needed. For gout flare up  30 tablet  12  . COLCRYS 0.6 MG tablet TAKE 1 TABLET BY MOUTH DAILY  30 tablet  5  . dabigatran (PRADAXA) 150 MG CAPS capsule Take 1 capsule (150 mg total)  by mouth every 12 (twelve) hours.  60 capsule  3  . doxazosin (CARDURA) 4 MG tablet Take 4 mg by mouth daily.      . famciclovir (FAMVIR) 500 MG tablet Take 3 tablets (1,500 mg total) by mouth daily as needed.  90 tablet  3  . finasteride (PROSCAR) 5 MG tablet Take 5 mg by mouth daily.        Marland Kitchen FLUoxetine (PROZAC) 20 MG capsule TAKE ONE CAPSULE BY MOUTH EVERY DAY  15 capsule  0  . hydrochlorothiazide (HYDRODIURIL) 25 MG tablet Take 1 tablet (25 mg total) by mouth daily.  30 tablet  5  . Multiple Vitamin (MULITIVITAMIN WITH MINERALS) TABS Take 1 tablet by mouth daily.      . Omega-3 Fatty Acids (OMEGA 3 PO) Take by mouth daily. 2 tablets daily      . oxyCODONE-acetaminophen (PERCOCET) 5-325 MG per tablet Take 1-2 tablets by mouth every 6 (six) hours as needed for pain.  30 tablet  0  . pravastatin (PRAVACHOL) 40 MG  tablet Take 1 tablet (40 mg total) by mouth daily.  30 tablet  6  . zolpidem (AMBIEN) 10 MG tablet TAKE 1 TABLET BY MOUTH AT BEDTIME AS NEEDED FOR SLEEP  30 tablet  1  . carvedilol (COREG) 6.25 MG tablet Take 1 tablet (6.25 mg total) by mouth 2 (two) times daily.  180 tablet  3   No current facility-administered medications for this visit.    Allergies  Allergen Reactions  . Escitalopram Oxalate     REACTION: no ejaculation  . Morphine Sulfate     REACTION: unspecified    History   Social History  . Marital Status: Married    Spouse Name: N/A    Number of Children: 2  . Years of Education: N/A   Occupational History  . TRUCK DRIVER Counselling psychologist   Social History Main Topics  . Smoking status: Former Smoker -- 30 years    Quit date: 08/26/1990  . Smokeless tobacco: Never Used  . Alcohol Use: 3.6 oz/week    6 Cans of beer per week     Comment: moderate ETOH use, only drinks on weekends  . Drug Use: No  . Sexual Activity: Not on file   Other Topics Concern  . Not on file   Social History Narrative   2 caffeine drinks daily     Family History  Problem Relation Age of Onset  . Bone cancer Mother   . Dementia Mother   . Breast cancer Mother     metastatic   . Heart attack Father   . Hypertension Father   . Aneurysm Sister   . Colon cancer Neg Hx   . Anesthesia problems Neg Hx    Physical Exam: Filed Vitals:   05/12/13 1532  BP: 171/99  Pulse: 77  Height: 6' (1.829 m)  Weight: 296 lb (134.265 kg)    GEN- The patient is well appearing, alert and oriented x 3 today.   Head- normocephalic, atraumatic Eyes-  Sclera clear, conjunctiva pink Ears- hearing intact Oropharynx- clear Neck- supple, no JVP Lymph- no cervical lymphadenopathy Lungs- Clear to ausculation bilaterally, normal work of breathing Chest- pacemaker pocket is well healed Heart- irregular rate and rhythm GI- soft, NT, ND, + BS Extremities- no clubbing, cyanosis, or  edema MS- no significant deformity or atrophy Skin- no rash or lesion Psych- euthymic mood, full affect Neuro- strength and sensation are intact  Pacemaker interrogation- reviewed in detail  today,  See PACEART report  Assessment and Plan:  1. SOB with activity Given exertional dypsnea, I think that we need to exclude ischemia.  Given V pacing, will proceed with a lexiscan myoview. Will also obtain an echo to evaluate for worsening CHF  2. Nonischemic CM EF previously depressed Will add coreg Repeat echo  3. HTN Above goal Add coreg He will follow-up with Norma Fredrickson in 4 weeks for further medicine titration.  4. Permanent afib Rate controlled Continue long term anticoagulation  5. CAD myoview as above  6. SSS/ symptomatic bradycardia Normal pacemaker function See Pace Art report No changes today He V paces 92%.  IF his EF worsens, could consider upgrade to CRT, though he appears to be minimally symptomatic   Follow-up with Lawson Fiscal for Coreg titration and evaluation post echo/ myoview in 4 weeks [facility] Return to see me in 1 year (unless needed sooner)

## 2013-05-13 LAB — PACEMAKER DEVICE OBSERVATION
AL IMPEDENCE PM: 376 Ohm
BATTERY VOLTAGE: 2.96 V
BMOD-0002RV: 7
RV LEAD IMPEDENCE PM: 496 Ohm
VENTRICULAR PACING PM: 91.82

## 2013-05-24 ENCOUNTER — Other Ambulatory Visit: Payer: Self-pay | Admitting: Family Medicine

## 2013-05-25 NOTE — Telephone Encounter (Signed)
Electronic refill request.  Please advise. 

## 2013-05-26 NOTE — Telephone Encounter (Signed)
Sent, schedule a CPE.  Thanks.  

## 2013-05-27 NOTE — Telephone Encounter (Signed)
Patient advised. Transferred to [place] to schedule CPE.

## 2013-05-28 ENCOUNTER — Other Ambulatory Visit (HOSPITAL_COMMUNITY): Payer: Self-pay | Admitting: Internal Medicine

## 2013-05-28 ENCOUNTER — Ambulatory Visit (HOSPITAL_COMMUNITY): Payer: 59 | Attending: Cardiovascular Disease | Admitting: Radiology

## 2013-05-28 DIAGNOSIS — I2510 Atherosclerotic heart disease of native coronary artery without angina pectoris: Secondary | ICD-10-CM | POA: Diagnosis not present

## 2013-05-28 DIAGNOSIS — R011 Cardiac murmur, unspecified: Secondary | ICD-10-CM | POA: Diagnosis not present

## 2013-05-28 DIAGNOSIS — G4733 Obstructive sleep apnea (adult) (pediatric): Secondary | ICD-10-CM | POA: Diagnosis not present

## 2013-05-28 DIAGNOSIS — I4891 Unspecified atrial fibrillation: Secondary | ICD-10-CM

## 2013-05-28 DIAGNOSIS — I4892 Unspecified atrial flutter: Secondary | ICD-10-CM | POA: Insufficient documentation

## 2013-05-28 DIAGNOSIS — E785 Hyperlipidemia, unspecified: Secondary | ICD-10-CM | POA: Diagnosis not present

## 2013-05-28 DIAGNOSIS — I10 Essential (primary) hypertension: Secondary | ICD-10-CM | POA: Diagnosis not present

## 2013-05-28 DIAGNOSIS — I428 Other cardiomyopathies: Secondary | ICD-10-CM | POA: Diagnosis not present

## 2013-05-28 DIAGNOSIS — I079 Rheumatic tricuspid valve disease, unspecified: Secondary | ICD-10-CM | POA: Insufficient documentation

## 2013-05-28 DIAGNOSIS — R0602 Shortness of breath: Secondary | ICD-10-CM

## 2013-05-28 DIAGNOSIS — I495 Sick sinus syndrome: Secondary | ICD-10-CM | POA: Insufficient documentation

## 2013-05-28 DIAGNOSIS — M1990 Unspecified osteoarthritis, unspecified site: Secondary | ICD-10-CM | POA: Diagnosis not present

## 2013-05-28 MED ORDER — PERFLUTREN PROTEIN A MICROSPH IV SUSP
0.5000 mL | Freq: Once | INTRAVENOUS | Status: AC
  Administered 2013-05-28: 0.5 mL via INTRAVENOUS

## 2013-05-28 NOTE — Progress Notes (Signed)
Echocardiogram performed [facility]

## 2013-05-28 NOTE — Addendum Note (Signed)
Addended by: Aida Raider T on: 05/28/2013 01:01 PM   Modules accepted: Orders

## 2013-06-01 ENCOUNTER — Encounter: Payer: Self-pay | Admitting: Cardiology

## 2013-06-01 ENCOUNTER — Encounter: Payer: Self-pay | Admitting: Internal Medicine

## 2013-06-02 ENCOUNTER — Encounter (HOSPITAL_COMMUNITY): Payer: 59

## 2013-06-04 ENCOUNTER — Telehealth: Payer: Self-pay | Admitting: *Deleted

## 2013-06-04 ENCOUNTER — Ambulatory Visit: Payer: 59 | Admitting: Nurse Practitioner

## 2013-06-04 NOTE — Telephone Encounter (Signed)
S/w pt was agreeable to come in on 10/27 @ 9:30 for ov with Norma Fredrickson, after all test are done

## 2013-06-08 ENCOUNTER — Other Ambulatory Visit: Payer: Self-pay | Admitting: Internal Medicine

## 2013-06-08 ENCOUNTER — Ambulatory Visit (HOSPITAL_COMMUNITY): Payer: 59 | Attending: Internal Medicine | Admitting: Radiology

## 2013-06-08 VITALS — BP 109/67 | Ht 72.0 in | Wt 292.0 lb

## 2013-06-08 DIAGNOSIS — Z8249 Family history of ischemic heart disease and other diseases of the circulatory system: Secondary | ICD-10-CM | POA: Insufficient documentation

## 2013-06-08 DIAGNOSIS — R9439 Abnormal result of other cardiovascular function study: Secondary | ICD-10-CM | POA: Diagnosis present

## 2013-06-08 DIAGNOSIS — Z87891 Personal history of nicotine dependence: Secondary | ICD-10-CM | POA: Insufficient documentation

## 2013-06-08 DIAGNOSIS — I2510 Atherosclerotic heart disease of native coronary artery without angina pectoris: Secondary | ICD-10-CM

## 2013-06-08 DIAGNOSIS — R0602 Shortness of breath: Secondary | ICD-10-CM

## 2013-06-08 DIAGNOSIS — I10 Essential (primary) hypertension: Secondary | ICD-10-CM | POA: Insufficient documentation

## 2013-06-08 MED ORDER — TECHNETIUM TC 99M SESTAMIBI GENERIC - CARDIOLITE
33.0000 | Freq: Once | INTRAVENOUS | Status: AC | PRN
  Administered 2013-06-08: 33 via INTRAVENOUS

## 2013-06-08 MED ORDER — ADENOSINE (DIAGNOSTIC) 3 MG/ML IV SOLN
0.5600 mg/kg | Freq: Once | INTRAVENOUS | Status: AC
  Administered 2013-06-08: 60 mg via INTRAVENOUS

## 2013-06-09 ENCOUNTER — Ambulatory Visit (HOSPITAL_COMMUNITY): Payer: 59 | Attending: Cardiology

## 2013-06-09 DIAGNOSIS — R0989 Other specified symptoms and signs involving the circulatory and respiratory systems: Secondary | ICD-10-CM

## 2013-06-09 MED ORDER — TECHNETIUM TC 99M SESTAMIBI GENERIC - CARDIOLITE
33.0000 | Freq: Once | INTRAVENOUS | Status: AC | PRN
  Administered 2013-06-09: 33 via INTRAVENOUS

## 2013-06-09 NOTE — Progress Notes (Signed)
[facility] SITE 3 NUCLEAR MED [address] [place], [place] [address] [phone]    Cardiology Nuclear Med Study  [name] [name] is a 72 y.o. male     MRN : [id number]     DOB: 1941/03/23  Procedure Date: 06/08/2013 Nuclear Med Background Indication for Stress Test:  Evaluation for Ischemia and Stent Patency History:  '03 Heart Cath: N/O CAD PTCA Ostium of 2nd Dx '11 MPS: EF: NL  No Afib, Pacemaker, '12 Ablation 05/28/13 ECHO: EF; 45% mod LVH, NICM  Cardiac Risk Factors: Family History - CAD, History of Smoking, Hypertension and Lipids  Symptoms:  DOE, Fatigue, Palpitations and SOB   Nuclear Pre-Procedure Caffeine/Decaff Intake:  None NPO After: 8:00pm   Lungs:  clear O2 Sat: 98% on room air. IV 0.9% NS with Angio Cath:  22g  IV Site: R Hand  IV Started by:  Milana Na, EMT-P  Chest Size (in):  52 Cup Size: n/a  Height: 6' (1.829 m)  Weight:  292 lb (132.45 kg)  BMI:  Body mass index is 39.59 kg/(m^2). Tech Comments:  Took Rx this am    Nuclear Med Study 1 or 2 day study: 2 day  Stress Test Type:  Adenosine  Reading MD: Charlton Haws, MD  Order Authorizing Provider:  J.Allred MD  Resting Radionuclide: Technetium 35m Sestamibi  Resting Radionuclide Dose: 33.0 mCi  06/09/13  Stress Radionuclide:  Technetium 48m Sestamibi  Stress Radionuclide Dose: 33.0 mCi  06/08/13          Stress Protocol Rest HR: 61 Stress HR: 65  Rest BP: 109/67 Stress BP: 144/79  Exercise Time (min): n/a METS: n/a   Predicted Max HR: 148 bpm % Max HR: 43.92 bpm Rate Pressure Product: 9360   Dose of Adenosine (mg):  60 Dose of Lexiscan: n/a mg  Dose of Atropine (mg): n/a Dose of Dobutamine: n/a mcg/kg/min (at max HR)  Stress Test Technologist: Milana Na, EMT-P  Nuclear Technologist:  Doyne Keel, CNMT     Rest Procedure:  Myocardial perfusion imaging was performed at rest 45 minutes following the intravenous administration of Technetium 30m Sestamibi. Rest ECG:  V-paced  Stress Procedure:  The patient received IV adenosine at 140 mcg/kg/min for 4 minutes. This patient had a hot flash, sob, and felt "swimmy headed" with the Adenosine infusion. Technetium 67m Sestamibi was injected at the 2 minute mark and quantitative spect images were obtained after a 45 minute delay. Stress ECG: No significant change from baseline ECG  QPS Raw Data Images:  Normal; no motion artifact; normal heart/lung ratio. Stress Images:  Medium-sized, moderate basal to apical inferior perfusion defect. Rest Images:  Medium-sized, moderate basal to apical inferior perfusion defect, less prominent than with stress. Subtraction (SDS):  Partially reversible medium-sized, moderate basal to apical inferior perfusion defect. Transient Ischemic Dilatation (Normal <1.22):  1.06 Lung/Heart Ratio (Normal <0.45):  0.34  Quantitative Gated Spect Images QGS EDV:  156 ml QGS ESV:  85 ml  Impression Exercise Capacity:  Adenosine study with no exercise. BP Response:  Normal blood pressure response. Clinical Symptoms:  Short of breath.  ECG Impression:  V-paced Comparison with Prior Nuclear Study: No images to compare  Overall Impression:  Intermediate risk stress nuclear study with a medium-sized, moderate partially reversible basal to apical inferior perfusion defect.  There was inferior hypokinesis.  Possible inferior infarction wtih peri-infarct ischemia. .  LV Ejection Fraction: 46%.  LV Wall Motion:  Inferior hypokinesis.   Marca Ancona 06/09/2013

## 2013-06-16 ENCOUNTER — Other Ambulatory Visit: Payer: Self-pay

## 2013-06-16 MED ORDER — ZOLPIDEM TARTRATE 10 MG PO TABS: ORAL_TABLET | ORAL | Status: DC

## 2013-06-16 NOTE — Telephone Encounter (Signed)
Rx called to pharmacy

## 2013-06-16 NOTE — Telephone Encounter (Signed)
Please call in.  Thanks.   

## 2013-06-16 NOTE — Telephone Encounter (Signed)
Tried to call [facility] several times and was disconnected every time. Will have to try again later.

## 2013-06-16 NOTE — Telephone Encounter (Signed)
[facility] left v/m requesting refill ambien; last filled 04/27/13.Please advise.

## 2013-06-20 ENCOUNTER — Other Ambulatory Visit: Payer: Self-pay | Admitting: Internal Medicine

## 2013-06-21 ENCOUNTER — Ambulatory Visit (INDEPENDENT_AMBULATORY_CARE_PROVIDER_SITE_OTHER): Payer: 59 | Admitting: Nurse Practitioner

## 2013-06-21 ENCOUNTER — Other Ambulatory Visit: Payer: Self-pay

## 2013-06-21 ENCOUNTER — Other Ambulatory Visit: Payer: Self-pay | Admitting: Nurse Practitioner

## 2013-06-21 ENCOUNTER — Encounter: Payer: Self-pay | Admitting: Nurse Practitioner

## 2013-06-21 ENCOUNTER — Ambulatory Visit
Admission: RE | Admit: 2013-06-21 | Discharge: 2013-06-21 | Disposition: A | Discharge status: Home / Self Care | Payer: 59 | Source: Ambulatory Visit | Attending: Nurse Practitioner | Admitting: Nurse Practitioner

## 2013-06-21 VITALS — BP 136/78 | HR 64 | Wt 295.4 lb

## 2013-06-21 DIAGNOSIS — I4891 Unspecified atrial fibrillation: Secondary | ICD-10-CM

## 2013-06-21 DIAGNOSIS — I2510 Atherosclerotic heart disease of native coronary artery without angina pectoris: Secondary | ICD-10-CM

## 2013-06-21 DIAGNOSIS — R0602 Shortness of breath: Secondary | ICD-10-CM

## 2013-06-21 DIAGNOSIS — M109 Gout, unspecified: Secondary | ICD-10-CM

## 2013-06-21 LAB — CBC WITH DIFFERENTIAL/PLATELET
Basophils Absolute: 0 10*3/uL (ref 0.0–0.1)
Basophils Relative: 0.5 % (ref 0.0–3.0)
Eosinophils Absolute: 0.1 10*3/uL (ref 0.0–0.7)
Eosinophils Relative: 1.1 % (ref 0.0–5.0)
HCT: 39.7 % (ref 39.0–52.0)
Hemoglobin: 13.6 g/dL (ref 13.0–17.0)
Lymphocytes Relative: 20.3 % (ref 12.0–46.0)
Lymphs Abs: 1.8 10*3/uL (ref 0.7–4.0)
MCHC: 34.3 g/dL (ref 30.0–36.0)
MCV: 93.4 fl (ref 78.0–100.0)
Monocytes Absolute: 0.7 10*3/uL (ref 0.1–1.0)
Monocytes Relative: 8.2 % (ref 3.0–12.0)
Neutro Abs: 6.1 10*3/uL (ref 1.4–7.7)
Neutrophils Relative %: 69.9 % (ref 43.0–77.0)
Platelets: 198 10*3/uL (ref 150.0–400.0)
RBC: 4.26 Mil/uL (ref 4.22–5.81)
RDW: 13.2 % (ref 11.5–14.6)
WBC: 8.8 10*3/uL (ref 4.5–10.5)

## 2013-06-21 LAB — BASIC METABOLIC PANEL
BUN: 21 mg/dL (ref 6–23)
CO2: 28 mEq/L (ref 19–32)
Calcium: 9.5 mg/dL (ref 8.4–10.5)
Chloride: 104 mEq/L (ref 96–112)
Creatinine, Ser: 1 mg/dL (ref 0.4–1.5)
GFR: 78.92 mL/min (ref >60.00)
Glucose, Bld: 122 mg/dL — ABNORMAL HIGH (ref 70–99)
Potassium: 3.9 mEq/L (ref 3.5–5.1)
Sodium: 140 mEq/L (ref 135–145)

## 2013-06-21 LAB — URIC ACID: Uric Acid, Serum: 8.3 mg/dL — ABNORMAL HIGH (ref 4.0–7.8)

## 2013-06-21 LAB — PROTIME-INR
INR: 1.9 ratio — ABNORMAL HIGH (ref 0.8–1.0)
Prothrombin Time: 19.5 s — ABNORMAL HIGH (ref 10.2–12.4)

## 2013-06-21 LAB — APTT: aPTT: 56.6 s — ABNORMAL HIGH (ref 21.7–28.8)

## 2013-06-21 MED ORDER — COLCHICINE 0.6 MG PO TABS: 0.6000 mg | ORAL_TABLET | Freq: Every day | ORAL | Status: DC | PRN

## 2013-06-21 NOTE — Progress Notes (Signed)
 [name] [name] Date of Birth: 01/10/1941 Medical Record #[id number]  History of Present Illness: "[name] [name]" is seen back today for a follow up visit. Seen for Dr. Allred. He has multiple issues which include CAD with remote PCI in 2003, chronic atrial fib, HTN, ETOH abuse, OSA on CPAP, obesity, HLD, GERD, SSS with PPM, past atrial flutter ablation, gout, and a hiatal hernia.   Other issues as noted below.   Seen last month by Dr. Allred - complaining of exertional DOE - Lexiscan ordered - inferior scar with possible peri-infarct ischemia which was new compared to prior Myoview per Dr. Tennant - cardiac cath to be considered. Echo was also repeated. BP was up - Coreg was titrated up. He remains rate controlled with chronic anticoagulation. Does V pace 92% and could consider upgrade to CRT.   Comes back today. Here alone. Continues to have DOE and fatigue - seems to have been going on for a while. No chest pain. Wants to stay active. Current gout flare up. Still drinking alcohol. Results of his tests are given. Has tolerated starting the Coreg without issue.   Current Outpatient Prescriptions  Medication Sig Dispense Refill  . amLODipine (NORVASC) 5 MG tablet Take 1 tablet (5 mg total) by mouth 2 (two) times daily.  60 tablet  6  . benazepril (LOTENSIN) 40 MG tablet TAKE 1 TABLET (40 MG TOTAL) BY MOUTH 2 (TWO) TIMES DAILY.  60 tablet  4  . carvedilol (COREG) 6.25 MG tablet Take 1 tablet (6.25 mg total) by mouth 2 (two) times daily.  180 tablet  3  . colchicine 0.6 MG tablet Take 1 tablet (0.6 mg total) by mouth daily as needed. For gout flare up  30 tablet  12  . dabigatran (PRADAXA) 150 MG CAPS capsule Take 1 capsule (150 mg total) by mouth every 12 (twelve) hours.  60 capsule  3  . doxazosin (CARDURA) 4 MG tablet Take 4 mg by mouth daily.      . famciclovir (FAMVIR) 500 MG tablet Take 3 tablets (1,500 mg total) by mouth daily as needed.  90 tablet  3  . finasteride (PROSCAR) 5 MG tablet Take 5  mg by mouth daily.        . FLUoxetine (PROZAC) 20 MG capsule Take 1 capsule by mouth  every day  90 capsule  1  . hydrochlorothiazide (HYDRODIURIL) 25 MG tablet Take 1 tablet (25 mg total) by mouth daily.  30 tablet  5  . Multiple Vitamin (MULITIVITAMIN WITH MINERALS) TABS Take 1 tablet by mouth daily.      . Omega-3 Fatty Acids (OMEGA 3 PO) Take by mouth daily. 2 tablets daily      . oxyCODONE-acetaminophen (PERCOCET) 5-325 MG per tablet Take 1-2 tablets by mouth every 6 (six) hours as needed for pain.  30 tablet  0  . pravastatin (PRAVACHOL) 40 MG tablet TAKE 1 TABLET BY MOUTH ONCE DAILY  30 tablet  5  . zolpidem (AMBIEN) 10 MG tablet TAKE 1 TABLET BY MOUTH AT BEDTIME AS NEEDED FOR SLEEP  30 tablet  1   No current facility-administered medications for this visit.    Allergies  Allergen Reactions  . Escitalopram Oxalate     REACTION: no ejaculation  . Morphine Sulfate     REACTION: unspecified    Past Medical History  Diagnosis Date  . CAD (coronary artery disease)     s/p PCI 2003. Last stress test in Feb 2011 was normal  . Persistent   atrial fibrillation     on Multaq and Pradaxa  . HTN (hypertension)   . ETOH abuse   . DJD (degenerative joint disease)     s/p Left TKA 06/2008  . Metabolic syndrome   . Hematuria   . Psychosexual dysfunction with inhibited sexual excitement   . Herpes zoster without mention of complication   . Hypertrophy of prostate without urinary obstruction and other lower urinary tract symptoms (LUTS)   . Anxiety state, unspecified   . Allergic rhinitis, cause unspecified   . OSA on CPAP     compliant with CPAP  . Obesity   . Hyperlipidemia   . GERD (gastroesophageal reflux disease)   . Sick sinus syndrome     s/p PPM by Dr Tennant  . Atrial flutter     s/p atrial flutter CTI ablation 10/26/10  . Hiatal hernia   . Gout   . Chronic kidney disease     Kidney Stones    Past Surgical History  Procedure Laterality Date  .  Esophagogastroduodenoscopy  1992    Dr. Sam  . Hiatal hernia repair  04/1998  . Balloon angioplasty, artery  2003    Ostium of the 2nd DX  . Cholecystectomy  1999    Dr. Hardcastle  . Basal cell carcinoma excision  12/2003    On back  . Total knee arthroplasty  06/2008    Dr. Alusio  . Pacemaker insertion  10/2009    [employer]  . Atrial flutter ablation  10/26/10  . Appendectomy    . Cystoscopy      stone removal  . Pars plana vitrectomy  01/01/2012    Procedure: PARS PLANA VITRECTOMY WITH 20 GAUGE;  Surgeon: Gary A Rankin, MD;  Location: [facility] OR;  Service: Ophthalmology;  Laterality: Right;  VITRECTOMY 25G, REMOVE SILICONE OIL, INJECTION OF SILICONE OIL 5000 TO RIGHT EYE, MEMBRANE PEEL, REPAIR COMPLEX RETINAL DETACHMENT, REMOVAL NON MAGNETIC FOREIGN BODY.  . Silicon oil removal  01/01/2012    Procedure: SILICON OIL REMOVAL;  Surgeon: Gary A Rankin, MD;  Location: [facility] OR;  Service: Ophthalmology;  Laterality: Right;  INSERT SILICONE OIL    History  Smoking status  . Former Smoker -- 30 years  . Quit date: 08/26/1990  Smokeless tobacco  . Never Used    History  Alcohol Use  . 3.6 oz/week  . 6 Cans of beer per week    Comment: moderate ETOH use, only drinks on weekends    Family History  Problem Relation Age of Onset  . Bone cancer Mother   . Dementia Mother   . Breast cancer Mother     metastatic   . Heart attack Father   . Hypertension Father   . Aneurysm Sister   . Colon cancer Neg Hx   . Anesthesia problems Neg Hx     Review of Systems: The review of systems is per the HPI.  All other systems were reviewed and are negative.  Physical Exam: BP 136/78  Pulse 64  Wt 295 lb 6.4 oz (133.993 kg)  BMI 40.05 kg/m2 Patient is very pleasant and in no acute distress. Remains morbidly obese. Skin is warm and dry. Color is normal.  HEENT is unremarkable. Normocephalic/atraumatic. PERRL. Sclera are nonicteric. Neck is supple. No masses. No JVD. Lungs are clear. Cardiac exam  shows an irregular rhythm. Rate is ok. Heart tones quite distant.  Abdomen is soft. Extremities are without edema. Gait and ROM are intact. No gross neurologic deficits noted.   Gout in the left hand noted.   LABORATORY DATA: PENDING  Lab Results  Component Value Date   WBC 6.4 01/01/2012   HGB 15.5 01/01/2012   HCT 43.3 01/01/2012   PLT 192 01/01/2012   GLUCOSE 116* 05/27/2012   CHOL 101 05/27/2012   TRIG 86.0 05/27/2012   HDL 28.60* 05/27/2012   LDLDIRECT 51.8 11/28/2010   LDLCALC 55 05/27/2012   ALT 29 05/27/2012   AST 28 05/27/2012   NA 140 05/27/2012   K 3.9 05/27/2012   CL 102 05/27/2012   CREATININE 1.1 05/27/2012   BUN 17 05/27/2012   CO2 28 05/27/2012   TSH 1.58 05/27/2012   PSA 0.30 05/27/2012   INR 1.43 10/26/2010   Myoview Impression  Exercise Capacity: Adenosine study with no exercise.  BP Response: Normal blood pressure response.  Clinical Symptoms: Short of breath.  ECG Impression: V-paced  Comparison with Prior Nuclear Study: No images to compare  Overall Impression: Intermediate risk stress nuclear study with a medium-sized, moderate partially reversible basal to apical inferior perfusion defect. There was inferior hypokinesis. Possible inferior infarction wtih peri-infarct ischemia. .  LV Ejection Fraction: 46%. LV Wall Motion: Inferior hypokinesis.  Dalton McLean  06/09/2013   Echo Study Conclusions  - Left ventricle: The cavity size was normal. Wall thickness was increased in a pattern of moderate LVH. Systolic function was mildly reduced. The estimated ejection fraction was in the range of 45% to 50%. Wall motion was normal; there were no regional wall motion abnormalities. - Left atrium: The atrium was moderately dilated. - Right atrium: The atrium was mildly dilated.  CARDIAC CATH 2003 ANGIOPLASTY PROCEDURE: We turned our attention to the second diagonal vessel.  Although it was small, it had a severe ostial stenosis. The guiding catheter  was a JL4 guide. High-torque  floppy guidewire required a secondary bend in  order to get it to traverse into the left anterior descending. There was no  difficulty in getting it to find the second diagonal vessel. The 2.0 x 10 mm  cutting balloon was positioned with difficulty across the stenosis. After the  initial balloon inflation, however, we were able to easily pass the catheter  back and forth across the stenosis. The balloon was inflated on four  different occasions to maximum of 12 atmospheres. The final angiographic  result was felt to be excellent with a less than 20% residual stenosis.  Overall, the patient tolerated the procedure well.  OVERALL IMPRESSION:  1. Normal left ventricular function.  2. Minimal coronary atherosclerosis in the right coronary artery and left  circumflex with mild to moderate proximal narrowings in the left anterior  descending, mild narrowings in the first diagonal, but a severe stenosis  in the second diagonal vessel.  3. Successful angioplasty of the second diagonal.  Dictated by: Stanley N. Tennant, M.D.  Attending Physician: Tennant, Stanley Neal  DD: 10/09/01  TD: 10/09/01  Job: [id number]  [id number]  Assessment / Plan:  1. CAD - no actual chest pain. Has had DOE and fatigue - probably multifactorial. Known CAD with remote PCI from 2003.   2. Abnormal myoview - he would like to proceed with cardiac cath - will arrange for later this week - hold Pradaxa 2 days prior.   3. Mild LV dysfunction - now on low dose BB and remains on ACE - hope to titrate up on return.   4. Underlying PPM  5. Morbid obesity   6. Gout - most likely due to his   alcohol - he has not had great insight into this over the years. Not on Allopurinol.   We will check labs today. Send for CXR. Cath for Friday with Dr. Cooper. Procedure has been reviewed and he is willing to proceed.   Patient is agreeable to this plan and will call if any problems develop in the interim.   Roldan Laforest C. Linsey Arteaga, RN,  ANP-C [facility] [address] [place], [place]  [address]   

## 2013-06-21 NOTE — Patient Instructions (Addendum)
Stay on your current medicines for now  We are going to arrange for a cardiac catheterization with Dr. Excell Seltzer this Friday  You are scheduled for a cardiac catheterization on Friday, October 31st with Dr. Excell Seltzer or associate.  Go to [facility] 2nd Floor Short Stay on Friday, October 31st at 7:30 am.  Enter thru the [employer] entrance A No food or drink after midnight on Thursday You may take your medications with a sip of water on the day of your procedure EXCEPT no Pradaxa on Wednesday or Thursday  I will see you in 2 to 3 weeks for your post hospital visit  We will check labs today -including a uric acid level  Go to [facility] for a CXR today - you may walk in  Call the [facility] office at [phone] if you have any questions, problems or concerns.

## 2013-06-22 ENCOUNTER — Encounter (HOSPITAL_COMMUNITY): Payer: Self-pay | Admitting: Pharmacy Technician

## 2013-06-22 ENCOUNTER — Other Ambulatory Visit: Payer: Self-pay | Admitting: *Deleted

## 2013-06-22 ENCOUNTER — Telehealth: Payer: Self-pay | Admitting: *Deleted

## 2013-06-22 MED ORDER — ALLOPURINOL 300 MG PO TABS: 300.0000 mg | ORAL_TABLET | Freq: Every day | ORAL | Status: DC

## 2013-06-22 NOTE — Telephone Encounter (Signed)
I did ask for this to be sent in - ok to send in Allopurinol 300 mg a day.

## 2013-06-22 NOTE — Telephone Encounter (Signed)
S/w  Pt sent in Allopurinol ( 300 mg ) daily to [facility] as per pt's request

## 2013-06-25 ENCOUNTER — Encounter (HOSPITAL_COMMUNITY)
Admission: RE | Disposition: A | Discharge status: Home / Self Care | Payer: Self-pay | Source: Ambulatory Visit | Attending: Cardiovascular Disease

## 2013-06-25 ENCOUNTER — Ambulatory Visit (HOSPITAL_COMMUNITY)
Admission: RE | Admit: 2013-06-25 | Discharge: 2013-06-25 | Disposition: A | Discharge status: Home / Self Care | Payer: 59 | Source: Ambulatory Visit | Attending: Cardiovascular Disease | Admitting: Cardiovascular Disease

## 2013-06-25 DIAGNOSIS — M109 Gout, unspecified: Secondary | ICD-10-CM | POA: Insufficient documentation

## 2013-06-25 DIAGNOSIS — Z9861 Coronary angioplasty status: Secondary | ICD-10-CM | POA: Insufficient documentation

## 2013-06-25 DIAGNOSIS — Z950 Presence of cardiac pacemaker: Secondary | ICD-10-CM | POA: Insufficient documentation

## 2013-06-25 DIAGNOSIS — E785 Hyperlipidemia, unspecified: Secondary | ICD-10-CM | POA: Insufficient documentation

## 2013-06-25 DIAGNOSIS — R9439 Abnormal result of other cardiovascular function study: Secondary | ICD-10-CM | POA: Insufficient documentation

## 2013-06-25 DIAGNOSIS — Z6841 Body Mass Index (BMI) 40.0 and over, adult: Secondary | ICD-10-CM | POA: Insufficient documentation

## 2013-06-25 DIAGNOSIS — I2510 Atherosclerotic heart disease of native coronary artery without angina pectoris: Secondary | ICD-10-CM

## 2013-06-25 DIAGNOSIS — I4891 Unspecified atrial fibrillation: Secondary | ICD-10-CM | POA: Insufficient documentation

## 2013-06-25 DIAGNOSIS — R0602 Shortness of breath: Secondary | ICD-10-CM

## 2013-06-25 DIAGNOSIS — I495 Sick sinus syndrome: Secondary | ICD-10-CM | POA: Insufficient documentation

## 2013-06-25 DIAGNOSIS — I10 Essential (primary) hypertension: Secondary | ICD-10-CM | POA: Insufficient documentation

## 2013-06-25 DIAGNOSIS — Z79899 Other long term (current) drug therapy: Secondary | ICD-10-CM | POA: Insufficient documentation

## 2013-06-25 DIAGNOSIS — Z7901 Long term (current) use of anticoagulants: Secondary | ICD-10-CM | POA: Insufficient documentation

## 2013-06-25 HISTORY — PX: LEFT HEART CATHETERIZATION WITH CORONARY ANGIOGRAM: SHX5451

## 2013-06-25 LAB — PROTIME-INR
INR: 1.15 (ref 0.00–1.49)
Prothrombin Time: 14.5 seconds (ref 11.6–15.2)

## 2013-06-25 SURGERY — LEFT HEART CATHETERIZATION WITH CORONARY ANGIOGRAM
Anesthesia: LOCAL

## 2013-06-25 MED ORDER — ACETAMINOPHEN 325 MG PO TABS: 650.0000 mg | ORAL_TABLET | ORAL | Status: DC | PRN

## 2013-06-25 MED ORDER — SODIUM CHLORIDE 0.9 % IJ SOLN: 3.0000 mL | INTRAMUSCULAR | Status: DC | PRN

## 2013-06-25 MED ORDER — SODIUM CHLORIDE 0.9 % IV SOLN: 1.0000 mL/kg/h | INTRAVENOUS | Status: DC

## 2013-06-25 MED ORDER — ASPIRIN 81 MG PO CHEW
81.0000 mg | CHEWABLE_TABLET | ORAL | Status: AC
  Administered 2013-06-25: 81 mg via ORAL
  Filled 2013-06-25: qty 1

## 2013-06-25 MED ORDER — HEPARIN SODIUM (PORCINE) 1000 UNIT/ML IJ SOLN
INTRAMUSCULAR | Status: AC
  Filled 2013-06-25: qty 1

## 2013-06-25 MED ORDER — ONDANSETRON HCL 4 MG/2ML IJ SOLN: 4.0000 mg | Freq: Four times a day (QID) | INTRAMUSCULAR | Status: DC | PRN

## 2013-06-25 MED ORDER — MIDAZOLAM HCL 2 MG/2ML IJ SOLN
INTRAMUSCULAR | Status: AC
  Filled 2013-06-25: qty 2

## 2013-06-25 MED ORDER — LIDOCAINE HCL (PF) 1 % IJ SOLN
INTRAMUSCULAR | Status: AC
  Filled 2013-06-25: qty 30

## 2013-06-25 MED ORDER — SODIUM CHLORIDE 0.9 % IJ SOLN: 3.0000 mL | Freq: Two times a day (BID) | INTRAMUSCULAR | Status: DC

## 2013-06-25 MED ORDER — SODIUM CHLORIDE 0.9 % IV SOLN: INTRAVENOUS | Status: DC

## 2013-06-25 MED ORDER — FENTANYL CITRATE 0.05 MG/ML IJ SOLN
INTRAMUSCULAR | Status: AC
  Filled 2013-06-25: qty 2

## 2013-06-25 MED ORDER — VERAPAMIL HCL 2.5 MG/ML IV SOLN
INTRAVENOUS | Status: AC
  Filled 2013-06-25: qty 2

## 2013-06-25 MED ORDER — SODIUM CHLORIDE 0.9 % IV SOLN: 250.0000 mL | INTRAVENOUS | Status: DC | PRN

## 2013-06-25 MED ORDER — NITROGLYCERIN 0.2 MG/ML ON CALL CATH LAB
INTRAVENOUS | Status: AC
  Filled 2013-06-25: qty 1

## 2013-06-25 MED ORDER — DIAZEPAM 5 MG PO TABS
10.0000 mg | ORAL_TABLET | ORAL | Status: AC
  Administered 2013-06-25: 10 mg via ORAL
  Filled 2013-06-25: qty 2

## 2013-06-25 MED ORDER — HEPARIN (PORCINE) IN NACL 2-0.9 UNIT/ML-% IJ SOLN
INTRAMUSCULAR | Status: AC
  Filled 2013-06-25: qty 1000

## 2013-06-25 NOTE — Interval H&P Note (Signed)
History and Physical Interval Note:  06/25/2013 8:58 AM  Delan A Highbaugh  has presented today for surgery, with the diagnosis of abnormal stress test  The various methods of treatment have been discussed with the patient and family. After consideration of risks, benefits and other options for treatment, the patient has consented to  Procedure(s): LEFT HEART CATHETERIZATION WITH CORONARY ANGIOGRAM (N/A) as a surgical intervention .  The patient's history has been reviewed, patient examined, no change in status, stable for surgery.  I have reviewed the patient's chart and labs.  Questions were answered to the patient's satisfaction.    Cath Lab Visit (complete for each Cath Lab visit)  Clinical Evaluation Leading to the Procedure:   ACS: no  Non-ACS:    Anginal Classification: CCS II  Anti-ischemic medical therapy: Maximal Therapy (2 or more classes of medications)  Non-Invasive Test Results: Intermediate-risk stress test findings: cardiac mortality 1-3%/year  Prior CABG: No previous CABG       Tonny Bollman

## 2013-06-25 NOTE — CV Procedure (Signed)
    Cardiac Catheterization Procedure Note  Name: MAHKAI FANGMAN MRN: [id number] DOB: 03-Sep-1940  Procedure: Left Heart Cath, Selective Coronary Angiography, LV angiography  Indication: Shortness of breath, abnormal stress test (moderate risk study with inferior scar/ischemia)   Procedural Details: The right wrist was prepped, draped, and anesthetized with 1% lidocaine. Using the modified Seldinger technique, a 5 French sheath was introduced into the right radial artery. 3 mg of verapamil was administered through the sheath, weight-based unfractionated heparin was administered intravenously. Standard Judkins catheters were used for selective coronary angiography and left ventriculography. Catheter exchanges were performed over an exchange length guidewire. There were no immediate procedural complications. A TR band was used for radial hemostasis at the completion of the procedure.  The patient was transferred to the post catheterization recovery area for further monitoring.  Procedural Findings: Hemodynamics: AO 145/82 LV 147/19  Coronary angiography: Coronary dominance: right  Left mainstem: The left mainstem arises from the left cusp. There is mild calcification. There is diffuse irregularity but no significant stenosis present.  Left anterior descending (LAD): The LAD is mildly calcified. There are diffuse luminal irregularities without significant stenoses. The vessel reaches the left ventricular apex. The first and second diagonal branches both have ostial stenoses. The first diagonal has 70% stenosis. The second diagonal has 80% ostial stenosis. Both vessels are small to medium in caliber.  Left circumflex (LCx): There is a large ramus intermedius branch. There is no significant stenosis of this branch noted. The AV groove circumflex is small without significant stenosis.  Right coronary artery (RCA): The RCA is dominant. The proximal vessel has 20% stenosis. The mid vessel has 30%  stenosis. The PDA and PLA branches are patent.  Left ventriculography: Left ventricular systolic function is low-normal, LVEF is estimated at 50%, there is no significant mitral regurgitation   Final Conclusions:   1. Mild nonobstructive coronary artery disease as detailed above 2. Low normal left ventricular systolic function  Recommendations: Medical therapy. The patient has stenosis of the diagonal branches of his LAD. These branches are fairly small and do not correlate with this perfusion defect on the Myoview scan. I do not think they're contributing to his shortness of breath. Medical therapy is recommended.  Tonny Bollman 06/25/2013, 9:54 AM

## 2013-06-25 NOTE — H&P (View-Only) (Signed)
[name] [name] Date of Birth: August 28, 1940 Medical Record #[id number]  History of Present Illness: "[name] [name]" is seen back today for a follow up visit. Seen for Dr. Johney Frame. He has multiple issues which include CAD with remote PCI in 2003, chronic atrial fib, HTN, ETOH abuse, OSA on CPAP, obesity, HLD, GERD, SSS with PPM, past atrial flutter ablation, gout, and a hiatal hernia.   Other issues as noted below.   Seen last month by Dr. Johney Frame - complaining of exertional DOE - Lexiscan ordered - inferior scar with possible peri-infarct ischemia which was new compared to prior Myoview per Dr. Deborah Chalk - cardiac cath to be considered. Echo was also repeated. BP was up - Coreg was titrated up. He remains rate controlled with chronic anticoagulation. Does V pace 92% and could consider upgrade to CRT.   Comes back today. Here alone. Continues to have DOE and fatigue - seems to have been going on for a while. No chest pain. Wants to stay active. Current gout flare up. Still drinking alcohol. Results of his tests are given. Has tolerated starting the Coreg without issue.   Current Outpatient Prescriptions  Medication Sig Dispense Refill  . amLODipine (NORVASC) 5 MG tablet Take 1 tablet (5 mg total) by mouth 2 (two) times daily.  60 tablet  6  . benazepril (LOTENSIN) 40 MG tablet TAKE 1 TABLET (40 MG TOTAL) BY MOUTH 2 (TWO) TIMES DAILY.  60 tablet  4  . carvedilol (COREG) 6.25 MG tablet Take 1 tablet (6.25 mg total) by mouth 2 (two) times daily.  180 tablet  3  . colchicine 0.6 MG tablet Take 1 tablet (0.6 mg total) by mouth daily as needed. For gout flare up  30 tablet  12  . dabigatran (PRADAXA) 150 MG CAPS capsule Take 1 capsule (150 mg total) by mouth every 12 (twelve) hours.  60 capsule  3  . doxazosin (CARDURA) 4 MG tablet Take 4 mg by mouth daily.      . famciclovir (FAMVIR) 500 MG tablet Take 3 tablets (1,500 mg total) by mouth daily as needed.  90 tablet  3  . finasteride (PROSCAR) 5 MG tablet Take 5  mg by mouth daily.        Marland Kitchen FLUoxetine (PROZAC) 20 MG capsule Take 1 capsule by mouth  every day  90 capsule  1  . hydrochlorothiazide (HYDRODIURIL) 25 MG tablet Take 1 tablet (25 mg total) by mouth daily.  30 tablet  5  . Multiple Vitamin (MULITIVITAMIN WITH MINERALS) TABS Take 1 tablet by mouth daily.      . Omega-3 Fatty Acids (OMEGA 3 PO) Take by mouth daily. 2 tablets daily      . oxyCODONE-acetaminophen (PERCOCET) 5-325 MG per tablet Take 1-2 tablets by mouth every 6 (six) hours as needed for pain.  30 tablet  0  . pravastatin (PRAVACHOL) 40 MG tablet TAKE 1 TABLET BY MOUTH ONCE DAILY  30 tablet  5  . zolpidem (AMBIEN) 10 MG tablet TAKE 1 TABLET BY MOUTH AT BEDTIME AS NEEDED FOR SLEEP  30 tablet  1   No current facility-administered medications for this visit.    Allergies  Allergen Reactions  . Escitalopram Oxalate     REACTION: no ejaculation  . Morphine Sulfate     REACTION: unspecified    Past Medical History  Diagnosis Date  . CAD (coronary artery disease)     s/p PCI 2003. Last stress test in Feb 2011 was normal  . Persistent  atrial fibrillation     on Multaq and Pradaxa  . HTN (hypertension)   . ETOH abuse   . DJD (degenerative joint disease)     s/p Left TKA 06/2008  . Metabolic syndrome   . Hematuria   . Psychosexual dysfunction with inhibited sexual excitement   . Herpes zoster without mention of complication   . Hypertrophy of prostate without urinary obstruction and other lower urinary tract symptoms (LUTS)   . Anxiety state, unspecified   . Allergic rhinitis, cause unspecified   . OSA on CPAP     compliant with CPAP  . Obesity   . Hyperlipidemia   . GERD (gastroesophageal reflux disease)   . Sick sinus syndrome     s/p PPM by Dr Deborah Chalk  . Atrial flutter     s/p atrial flutter CTI ablation 10/26/10  . Hiatal hernia   . Gout   . Chronic kidney disease     Kidney Stones    Past Surgical History  Procedure Laterality Date  .  Esophagogastroduodenoscopy  1992    Dr. Doreatha Martin  . Hiatal hernia repair  04/1998  . Balloon angioplasty, artery  2003    Ostium of the 2nd DX  . Cholecystectomy  1999    Dr. Luan Pulling  . Basal cell carcinoma excision  12/2003    On back  . Total knee arthroplasty  06/2008    Dr. Despina Hick  . Pacemaker insertion  10/2009    [employer]  . Atrial flutter ablation  10/26/10  . Appendectomy    . Cystoscopy      stone removal  . Pars plana vitrectomy  01/01/2012    Procedure: PARS PLANA VITRECTOMY WITH 20 GAUGE;  Surgeon: Edmon Crape, MD;  Location: [facility] OR;  Service: Ophthalmology;  Laterality: Right;  VITRECTOMY 25G, REMOVE SILICONE OIL, INJECTION OF SILICONE OIL 5000 TO RIGHT EYE, MEMBRANE PEEL, REPAIR COMPLEX RETINAL DETACHMENT, REMOVAL NON MAGNETIC FOREIGN BODY.  . Silicon oil removal  01/01/2012    Procedure: SILICON OIL REMOVAL;  Surgeon: Edmon Crape, MD;  Location: [facility] OR;  Service: Ophthalmology;  Laterality: Right;  INSERT SILICONE OIL    History  Smoking status  . Former Smoker -- 30 years  . Quit date: 08/26/1990  Smokeless tobacco  . Never Used    History  Alcohol Use  . 3.6 oz/week  . 6 Cans of beer per week    Comment: moderate ETOH use, only drinks on weekends    Family History  Problem Relation Age of Onset  . Bone cancer Mother   . Dementia Mother   . Breast cancer Mother     metastatic   . Heart attack Father   . Hypertension Father   . Aneurysm Sister   . Colon cancer Neg Hx   . Anesthesia problems Neg Hx     Review of Systems: The review of systems is per the HPI.  All other systems were reviewed and are negative.  Physical Exam: BP 136/78  Pulse 64  Wt 295 lb 6.4 oz (133.993 kg)  BMI 40.05 kg/m2 Patient is very pleasant and in no acute distress. Remains morbidly obese. Skin is warm and dry. Color is normal.  HEENT is unremarkable. Normocephalic/atraumatic. PERRL. Sclera are nonicteric. Neck is supple. No masses. No JVD. Lungs are clear. Cardiac exam  shows an irregular rhythm. Rate is ok. Heart tones quite distant.  Abdomen is soft. Extremities are without edema. Gait and ROM are intact. No gross neurologic deficits noted.  Gout in the left hand noted.   LABORATORY DATA: PENDING  Lab Results  Component Value Date   WBC 6.4 01/01/2012   HGB 15.5 01/01/2012   HCT 43.3 01/01/2012   PLT 192 01/01/2012   GLUCOSE 116* 05/27/2012   CHOL 101 05/27/2012   TRIG 86.0 05/27/2012   HDL 28.60* 05/27/2012   LDLDIRECT 51.8 11/28/2010   LDLCALC 55 05/27/2012   ALT 29 05/27/2012   AST 28 05/27/2012   NA 140 05/27/2012   K 3.9 05/27/2012   CL 102 05/27/2012   CREATININE 1.1 05/27/2012   BUN 17 05/27/2012   CO2 28 05/27/2012   TSH 1.58 05/27/2012   PSA 0.30 05/27/2012   INR 1.43 10/26/2010   Myoview Impression  Exercise Capacity: Adenosine study with no exercise.  BP Response: Normal blood pressure response.  Clinical Symptoms: Short of breath.  ECG Impression: V-paced  Comparison with Prior Nuclear Study: No images to compare  Overall Impression: Intermediate risk stress nuclear study with a medium-sized, moderate partially reversible basal to apical inferior perfusion defect. There was inferior hypokinesis. Possible inferior infarction wtih peri-infarct ischemia. .  LV Ejection Fraction: 46%. LV Wall Motion: Inferior hypokinesis.  Marca Ancona  06/09/2013   Echo Study Conclusions  - Left ventricle: The cavity size was normal. Wall thickness was increased in a pattern of moderate LVH. Systolic function was mildly reduced. The estimated ejection fraction was in the range of 45% to 50%. Wall motion was normal; there were no regional wall motion abnormalities. - Left atrium: The atrium was moderately dilated. - Right atrium: The atrium was mildly dilated.  CARDIAC CATH 2003 ANGIOPLASTY PROCEDURE: We turned our attention to the second diagonal vessel.  Although it was small, it had a severe ostial stenosis. The guiding catheter  was a JL4 guide. High-torque  floppy guidewire required a secondary bend in  order to get it to traverse into the left anterior descending. There was no  difficulty in getting it to find the second diagonal vessel. The 2.0 x 10 mm  cutting balloon was positioned with difficulty across the stenosis. After the  initial balloon inflation, however, we were able to easily pass the catheter  back and forth across the stenosis. The balloon was inflated on four  different occasions to maximum of 12 atmospheres. The final angiographic  result was felt to be excellent with a less than 20% residual stenosis.  Overall, the patient tolerated the procedure well.  OVERALL IMPRESSION:  1. Normal left ventricular function.  2. Minimal coronary atherosclerosis in the right coronary artery and left  circumflex with mild to moderate proximal narrowings in the left anterior  descending, mild narrowings in the first diagonal, but a severe stenosis  in the second diagonal vessel.  3. Successful angioplasty of the second diagonal.  Dictated by: Colleen Can. Deborah Chalk, M.D.  Attending Physician: Eleanora Neighbor  DD: 10/09/01  TD: 10/09/01  Job: [id number]  [id number]  Assessment / Plan:  1. CAD - no actual chest pain. Has had DOE and fatigue - probably multifactorial. Known CAD with remote PCI from 2003.   2. Abnormal myoview - he would like to proceed with cardiac cath - will arrange for later this week - hold Pradaxa 2 days prior.   3. Mild LV dysfunction - now on low dose BB and remains on ACE - hope to titrate up on return.   4. Underlying PPM  5. Morbid obesity   6. Gout - most likely due to his  alcohol - he has not had great insight into this over the years. Not on Allopurinol.   We will check labs today. Send for CXR. Cath for Friday with Dr. Excell Seltzer. Procedure has been reviewed and he is willing to proceed.   Patient is agreeable to this plan and will call if any problems develop in the interim.   Rosalio Macadamia, RN,  ANP-C [facility] [address] [place], [place]  [address]

## 2013-07-07 ENCOUNTER — Encounter: Payer: Self-pay | Admitting: Nurse Practitioner

## 2013-07-07 ENCOUNTER — Ambulatory Visit (INDEPENDENT_AMBULATORY_CARE_PROVIDER_SITE_OTHER): Payer: 59 | Admitting: Nurse Practitioner

## 2013-07-07 VITALS — BP 132/78 | HR 76 | Ht 72.0 in | Wt 294.0 lb

## 2013-07-07 DIAGNOSIS — R0602 Shortness of breath: Secondary | ICD-10-CM

## 2013-07-07 DIAGNOSIS — Z9889 Other specified postprocedural states: Secondary | ICD-10-CM

## 2013-07-07 NOTE — Patient Instructions (Addendum)
Stay on your current medicines  We will get you a visit with Dr. Marcelyn Bruins for your sleep apnea (has seen him in the remote past)  Try to work on your weight  Call the [facility] office at [phone] if you have any questions, problems or concerns.

## 2013-07-07 NOTE — Progress Notes (Signed)
[name] [name] Date of Birth: 06/05/1941 Medical Record #[id number]  History of Present Illness: [name] [name]" is seen back today for a follow up visit. Seen for Dr. Johney Frame. He has multiple issues which include CAD with remote PCI in 2003, chronic atrial fib, HTN, ETOH abuse, OSA on CPAP, obesity, HLD, GERD, SSS with PPM, past atrial flutter ablation, gout, and a hiatal hernia. Other issues as noted below.   Seen in September by Dr. Johney Frame - complaining of exertional DOE - Lexiscan ordered - inferior scar with possible peri-infarct ischemia which was new compared to prior Myoview per Dr. Deborah Chalk - cardiac cath to be considered. Echo was also repeated. BP was up - Coreg was titrated up. He remains rate controlled with chronic anticoagulation. Does V pace 92% and could consider upgrade to CRT. We proceeded on with repeat cath - results below.   Comes back today. Here alone. Doing well. Still with some shortness of breath with over exertion. Feels fatigued. Does have sleep apnea - not seen in over 10 years and still on the same settings. Still traveling with work. Continues to struggle with his weight. No problems with his cath site. BP is ok.   Current Outpatient Prescriptions  Medication Sig Dispense Refill  . allopurinol (ZYLOPRIM) 300 MG tablet Take 1 tablet (300 mg total) by mouth daily.  30 tablet  6  . amLODipine (NORVASC) 5 MG tablet Take 1 tablet (5 mg total) by mouth 2 (two) times daily.  60 tablet  6  . benazepril (LOTENSIN) 40 MG tablet Take 40 mg by mouth 2 (two) times daily.      . carvedilol (COREG) 6.25 MG tablet Take 1 tablet (6.25 mg total) by mouth 2 (two) times daily.  180 tablet  3  . dabigatran (PRADAXA) 150 MG CAPS capsule Take 1 capsule (150 mg total) by mouth every 12 (twelve) hours.  60 capsule  3  . doxazosin (CARDURA) 4 MG tablet Take 4 mg by mouth daily.      . famciclovir (FAMVIR) 500 MG tablet Take 1,500 mg by mouth daily as needed.      . finasteride (PROSCAR) 5 MG tablet  Take 5 mg by mouth daily.        Marland Kitchen FLUoxetine (PROZAC) 20 MG capsule Take 1 capsule by mouth  every day  90 capsule  1  . hydrochlorothiazide (HYDRODIURIL) 25 MG tablet Take 1 tablet (25 mg total) by mouth daily.  30 tablet  5  . Multiple Vitamin (MULITIVITAMIN WITH MINERALS) TABS Take 1 tablet by mouth daily.      Marland Kitchen oxyCODONE-acetaminophen (PERCOCET) 5-325 MG per tablet Take 1-2 tablets by mouth every 6 (six) hours as needed for pain.  30 tablet  0  . pravastatin (PRAVACHOL) 40 MG tablet Take 40 mg by mouth daily.      Marland Kitchen zolpidem (AMBIEN) 10 MG tablet Take 10 mg by mouth at bedtime as needed for sleep.       No current facility-administered medications for this visit.    Allergies  Allergen Reactions  . Morphine Sulfate     REACTION: unspecified    Past Medical History  Diagnosis Date  . CAD (coronary artery disease)     s/p PCI 2003. Last stress test in Feb 2011 was normal  . Persistent atrial fibrillation     on Multaq and Pradaxa  . HTN (hypertension)   . ETOH abuse   . DJD (degenerative joint disease)     s/p Left TKA  06/2008  . Metabolic syndrome   . Hematuria   . Psychosexual dysfunction with inhibited sexual excitement   . Herpes zoster without mention of complication   . Hypertrophy of prostate without urinary obstruction and other lower urinary tract symptoms (LUTS)   . Anxiety state, unspecified   . Allergic rhinitis, cause unspecified   . OSA on CPAP     compliant with CPAP  . Obesity   . Hyperlipidemia   . GERD (gastroesophageal reflux disease)   . Sick sinus syndrome     s/p PPM by Dr Deborah Chalk  . Atrial flutter     s/p atrial flutter CTI ablation 10/26/10  . Hiatal hernia   . Gout   . Chronic kidney disease     Kidney Stones    Past Surgical History  Procedure Laterality Date  . Esophagogastroduodenoscopy  1992    Dr. Doreatha Martin  . Hiatal hernia repair  04/1998  . Balloon angioplasty, artery  2003    Ostium of the 2nd DX  . Cholecystectomy  1999    Dr.  Luan Pulling  . Basal cell carcinoma excision  12/2003    On back  . Total knee arthroplasty  06/2008    Dr. Despina Hick  . Pacemaker insertion  10/2009    [employer]  . Atrial flutter ablation  10/26/10  . Appendectomy    . Cystoscopy      stone removal  . Pars plana vitrectomy  01/01/2012    Procedure: PARS PLANA VITRECTOMY WITH 20 GAUGE;  Surgeon: Edmon Crape, MD;  Location: [facility] OR;  Service: Ophthalmology;  Laterality: Right;  VITRECTOMY 25G, REMOVE SILICONE OIL, INJECTION OF SILICONE OIL 5000 TO RIGHT EYE, MEMBRANE PEEL, REPAIR COMPLEX RETINAL DETACHMENT, REMOVAL NON MAGNETIC FOREIGN BODY.  . Silicon oil removal  01/01/2012    Procedure: SILICON OIL REMOVAL;  Surgeon: Edmon Crape, MD;  Location: [facility] OR;  Service: Ophthalmology;  Laterality: Right;  INSERT SILICONE OIL    History  Smoking status  . Former Smoker -- 30 years  . Quit date: 08/26/1990  Smokeless tobacco  . Never Used    History  Alcohol Use  . 3.6 oz/week  . 6 Cans of beer per week    Comment: moderate ETOH use, only drinks on weekends    Family History  Problem Relation Age of Onset  . Bone cancer Mother   . Dementia Mother   . Breast cancer Mother     metastatic   . Heart attack Father   . Hypertension Father   . Aneurysm Sister   . Colon cancer Neg Hx   . Anesthesia problems Neg Hx     Review of Systems: The review of systems is per the HPI.  All other systems were reviewed and are negative.  Physical Exam: BP 132/78  Pulse 76  Ht 6' (1.829 m)  Wt 294 lb (133.358 kg)  BMI 39.86 kg/m2  SpO2 96% Patient is very pleasant and in no acute distress. He remains obese. Skin is warm and dry. Color is normal.  HEENT is unremarkable. Normocephalic/atraumatic. PERRL. Sclera are nonicteric. Neck is supple. No masses. No JVD. Lungs are clear. Cardiac exam shows a fairly regular rate and rhythm. Abdomen is soft. Extremities are without edema. Gait and ROM are intact. No gross neurologic deficits noted.  LABORATORY  DATA:  Lab Results  Component Value Date   WBC 8.8 06/21/2013   HGB 13.6 06/21/2013   HCT 39.7 06/21/2013   PLT 198.0 06/21/2013  GLUCOSE 122* 06/21/2013   CHOL 101 05/27/2012   TRIG 86.0 05/27/2012   HDL 28.60* 05/27/2012   LDLDIRECT 51.8 11/28/2010   LDLCALC 55 05/27/2012   ALT 29 05/27/2012   AST 28 05/27/2012   NA 140 06/21/2013   K 3.9 06/21/2013   CL 104 06/21/2013   CREATININE 1.0 06/21/2013   BUN 21 06/21/2013   CO2 28 06/21/2013   TSH 1.58 05/27/2012   PSA 0.30 05/27/2012   INR 1.15 06/25/2013    Coronary angiography:  Coronary dominance: right  Left mainstem: The left mainstem arises from the left cusp. There is mild calcification. There is diffuse irregularity but no significant stenosis present.  Left anterior descending (LAD): The LAD is mildly calcified. There are diffuse luminal irregularities without significant stenoses. The vessel reaches the left ventricular apex. The first and second diagonal branches both have ostial stenoses. The first diagonal has 70% stenosis. The second diagonal has 80% ostial stenosis. Both vessels are small to medium in caliber.  Left circumflex (LCx): There is a large ramus intermedius branch. There is no significant stenosis of this branch noted. The AV groove circumflex is small without significant stenosis.  Right coronary artery (RCA): The RCA is dominant. The proximal vessel has 20% stenosis. The mid vessel has 30% stenosis. The PDA and PLA branches are patent.  Left ventriculography: Left ventricular systolic function is low-normal, LVEF is estimated at 50%, there is no significant mitral regurgitation   Final Conclusions:  1. Mild nonobstructive coronary artery disease as detailed above  2. Low normal left ventricular systolic function   Recommendations: Medical therapy. The patient has stenosis of the diagonal branches of his LAD. These branches are fairly small and do not correlate with this perfusion defect on the Myoview scan. I do  not think they're contributing to his shortness of breath. Medical therapy is recommended.   Tonny Bollman  06/25/2013, 9:54 AM    Assessment / Plan: 1. S/P cardiac cath  - stable findings - to manage medically   2. Dyspnea - most likely weight related - does have sleep apnea as well - would like to go back and discuss his OSA with Dr. Shelle Iron.   3. Chronic atrial fib - managed with rate control and anticoagulation  4. HTN - BP looks good.   5. OSA - on CPAP - will get back to see Dr. Shelle Iron  6. Underlying PPM - would consider upgrade to CRT but only if EF worsens - his EF was 50% by his recent cath.   7. Gout - now on allopurinol  For now, no change in medicines. Encouraged him to try to work on his weight. Refer back to Dr. Shelle Iron. Will tentatively see back as planned. Continue remote pacer checks.   Patient is agreeable to this plan and will call if any problems develop in the interim.   Rosalio Macadamia, RN, ANP-C [facility] [address] [place], [place]  [address]

## 2013-07-18 ENCOUNTER — Other Ambulatory Visit: Payer: Self-pay | Admitting: Family Medicine

## 2013-07-18 DIAGNOSIS — I10 Essential (primary) hypertension: Secondary | ICD-10-CM

## 2013-07-18 DIAGNOSIS — M109 Gout, unspecified: Secondary | ICD-10-CM

## 2013-07-26 ENCOUNTER — Other Ambulatory Visit (INDEPENDENT_AMBULATORY_CARE_PROVIDER_SITE_OTHER): Payer: 59

## 2013-07-26 DIAGNOSIS — I10 Essential (primary) hypertension: Secondary | ICD-10-CM

## 2013-07-26 DIAGNOSIS — M109 Gout, unspecified: Secondary | ICD-10-CM

## 2013-07-26 LAB — COMPREHENSIVE METABOLIC PANEL
Alkaline Phosphatase: 59 U/L (ref 39–117)
BUN: 16 mg/dL (ref 6–23)
CO2: 30 mEq/L (ref 19–32)
Calcium: 9.5 mg/dL (ref 8.4–10.5)
Chloride: 104 mEq/L (ref 96–112)
Creatinine, Ser: 1 mg/dL (ref 0.4–1.5)
GFR: 77.1 mL/min (ref >60.00)
Glucose, Bld: 121 mg/dL — ABNORMAL HIGH (ref 70–99)
Sodium: 142 mEq/L (ref 135–145)
Total Bilirubin: 1.2 mg/dL (ref 0.3–1.2)
Total Protein: 7.3 g/dL (ref 6.0–8.3)

## 2013-07-26 LAB — LIPID PANEL
Cholesterol: 132 mg/dL (ref 0–200)
HDL: 29 mg/dL — ABNORMAL LOW (ref >39.00)
LDL Cholesterol: 69 mg/dL (ref 0–99)
VLDL: 33.8 mg/dL (ref 0.0–40.0)

## 2013-07-26 LAB — URIC ACID: Uric Acid, Serum: 6.1 mg/dL (ref 4.0–7.8)

## 2013-07-26 LAB — TSH: TSH: 1.59 u[IU]/mL (ref 0.35–5.50)

## 2013-07-30 ENCOUNTER — Ambulatory Visit (INDEPENDENT_AMBULATORY_CARE_PROVIDER_SITE_OTHER): Payer: 59 | Admitting: Family Medicine

## 2013-07-30 ENCOUNTER — Encounter: Payer: Self-pay | Admitting: Family Medicine

## 2013-07-30 VITALS — BP 138/86 | HR 84 | Temp 97.8°F | Ht 72.0 in | Wt 291.2 lb

## 2013-07-30 DIAGNOSIS — G4700 Insomnia, unspecified: Secondary | ICD-10-CM

## 2013-07-30 DIAGNOSIS — R7309 Other abnormal glucose: Secondary | ICD-10-CM

## 2013-07-30 DIAGNOSIS — R739 Hyperglycemia, unspecified: Secondary | ICD-10-CM

## 2013-07-30 DIAGNOSIS — I4891 Unspecified atrial fibrillation: Secondary | ICD-10-CM

## 2013-07-30 DIAGNOSIS — Z0000 Encounter for general adult medical examination without abnormal findings: Secondary | ICD-10-CM

## 2013-07-30 DIAGNOSIS — M109 Gout, unspecified: Secondary | ICD-10-CM

## 2013-07-30 MED ORDER — OXYCODONE-ACETAMINOPHEN 5-325 MG PO TABS: 1.0000 | ORAL_TABLET | Freq: Four times a day (QID) | ORAL | Status: DC | PRN

## 2013-07-30 MED ORDER — COLCHICINE 0.6 MG PO TABS: 0.6000 mg | ORAL_TABLET | Freq: Every day | ORAL | Status: DC

## 2013-07-30 MED ORDER — FAMCICLOVIR 500 MG PO TABS: 500.0000 mg | ORAL_TABLET | Freq: Three times a day (TID) | ORAL | Status: DC | PRN

## 2013-07-30 MED ORDER — ZOLPIDEM TARTRATE 10 MG PO TABS: 10.0000 mg | ORAL_TABLET | Freq: Every evening | ORAL | Status: DC | PRN

## 2013-07-30 MED ORDER — FLUOXETINE HCL 20 MG PO CAPS: ORAL_CAPSULE | ORAL | Status: DC

## 2013-07-30 NOTE — Patient Instructions (Addendum)
Shirlee Limerick will call about your referral. Take care.  Work on cutting out sugars and losing some weight.  Glad to see you.

## 2013-07-30 NOTE — Progress Notes (Signed)
Pre-visit discussion using our clinic review tool. No additional management support is needed unless otherwise documented below in the visit note.  I have personally reviewed the Medicare Annual Wellness questionnaire and have noted 1. The patient's medical and social history 2. Their use of alcohol, tobacco or illicit drugs 3. Their current medications and supplements 4. The patient's functional ability including ADL's, fall risks, home safety risks and hearing or visual             impairment. 5. Diet and physical activities 6. Evidence for depression or mood disorders  The patients weight, height, BMI have been recorded in the chart and visual acuity is per eye clinic.  I have made referrals, counseling and provided education to the patient based review of the above and I have provided the pt with a written personalized care plan for preventive services.  See scanned forms.  Routine anticipatory guidance given to patient.  See health maintenance. Flu 2014 Shingles prev done.   PNA prev done Tetanus 2009 Colonoscopy 2012 Prostate cancer screening and PSA options (with potential risks and benefits of testing vs not testing) were discussed along with recent recs/guidelines.  He declined testing PSA at this point.  Advance directive dw/ tp.  Wife designated if incapacitated.  Cognitive function addressed- see scanned forms- and if abnormal then additional documentation follows.   SOB.  H/o OSA.  Has f/u with pulmonary pending.    Gout, esp on L 3rd PIP.  On baseline meds, d/w pt about ortho eval at hand clinic to see what options are available.    AF.  No CP.  No BLE edema. Compliant with meds. No sig bleeding/bruising that would prevent continued anticoagulation.    Hyperglycemia. D/w pt about weight and diet, low carb foods. Labs d/w pt.  Goal for weight loss and recheck in about 6 months.    Insomnia controlled with current meds.  See above re: OSA.  PMH and SH reviewed  Meds,  vitals, and allergies reviewed.   ROS: See HPI.  Otherwise negative.    GEN: nad, alert and oriented HEENT: mucous membranes moist NECK: supple w/o LA CV: IRR, not tachy PULM: ctab, no inc wob ABD: soft, +bs EXT: no edema SKIN: no acute rash Chronic changes noted on the R 3rd PIP

## 2013-08-01 NOTE — Assessment & Plan Note (Signed)
He'll f/u with pulm re: OSA and continue other meds, ie ambien, in meantime.  No ADE on meds.

## 2013-08-01 NOTE — Assessment & Plan Note (Signed)
Continue current meds,  Refer to hand clinic about options.

## 2013-08-01 NOTE — Assessment & Plan Note (Signed)
See scanned forms. Routine anticipatory guidance given to patient. See health maintenance.  Flu 2014  Shingles prev done.  PNA prev done  Tetanus 2009  Colonoscopy 2012  Prostate cancer screening and PSA options (with potential risks and benefits of testing vs not testing) were discussed along with recent recs/guidelines. He declined testing PSA at this point.  Advance directive dw/ tp. Wife designated if incapacitated.  Cognitive function addressed- see scanned forms- and if abnormal then additional documentation follows.

## 2013-08-01 NOTE — Assessment & Plan Note (Signed)
D/w pt about weight and diet, low carb foods. Labs d/w pt.  Goal for weight loss and recheck in about 6 months.

## 2013-08-01 NOTE — Assessment & Plan Note (Signed)
Per cards, no CP, continue current meds.

## 2013-08-10 ENCOUNTER — Other Ambulatory Visit: Payer: Self-pay | Admitting: Internal Medicine

## 2013-08-13 ENCOUNTER — Ambulatory Visit (INDEPENDENT_AMBULATORY_CARE_PROVIDER_SITE_OTHER): Payer: 59 | Admitting: Pulmonary Disease

## 2013-08-13 ENCOUNTER — Encounter: Payer: Self-pay | Admitting: Pulmonary Disease

## 2013-08-13 VITALS — BP 136/80 | HR 66 | Temp 98.3°F | Ht 73.0 in | Wt 299.2 lb

## 2013-08-13 DIAGNOSIS — G4733 Obstructive sleep apnea (adult) (pediatric): Secondary | ICD-10-CM

## 2013-08-13 NOTE — Patient Instructions (Signed)
Will have your machine checked to make sure in working order. Need to get new cushion for your cpap mask Will have your pressure re-optimized on an auto device for 2-3 weeks, and will let you know your pressure. Work on weight loss. followup with me in one year if doing well.

## 2013-08-13 NOTE — Assessment & Plan Note (Signed)
The patient has a history of. Severe obstructive sleep apnea, and has been on CPAP since 2004. He is now having issues with frequent awakenings, as well as non-restorative sleep. He has gained at least 40 pounds over the last few years. It is unclear if this is related to his CPAP machine, or whether his pressure needs have increased. I suspect the latter. It is also possible that it has nothing to do with his sleep apnea at all. Will make sure that he gets a new mask, and we'll optimize his pressure again on an automatic device. I've encouraged him to work aggressively on weight loss, and he is to see me on a yearly basis if doing well.

## 2013-08-13 NOTE — Progress Notes (Signed)
Subjective:    Patient ID: [name] [name], male    DOB: [birth year], 72 y.o.   MRN: [id number]  HPI The patient is a 72 year old male who I've been asked to see for management of obstructive sleep apnea. He was initially diagnosed with very severe sleep apnea 2004, with an AHI of 110 events per hour. He was started on CPAP, and did very well with the device. I last saw him in 2009 where he got a new CPAP machine. He has not followed up since that time, but has remained compliant with CPAP. He is not having breakthrough events according to his bed partner, but does use a nasal mask with a cushion that is aged. He is having frequent awakenings during the night, and is not rested most mornings upon arising. He feels tired during the day, but only has variable sleep pressure. His Epworth score today is 8. The patient tells me that his weight is up it is 40 pounds over the last few years, and I suspect this is the problem with his sleep.   Sleep Questionnaire What time do you typically go to bed?( Between what hours) 9 and 10pm 9 and 10pm at 1435 on 08/13/13 by Darrell Jewel, CMA How long does it take you to fall asleep? 30 minutes 30 minutes at 1435 on 08/13/13 by Darrell Jewel, CMA How many times during the night do you wake up? 5 5 at 1435 on 08/13/13 by Darrell Jewel, CMA What time do you get out of bed to start your day? 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 Yes Yes at 1435 on 08/13/13 by Darrell Jewel, CMA How much has your weight changed (up or down) over the past two years? (In pounds) 40 lb (18.144 kg) 40 lb (18.144 kg) at 1435 on 08/13/13 by Darrell Jewel, CMA Have you ever had a sleep study before? Yes Yes at 1435 on 08/13/13 by Darrell Jewel, CMA If yes, location of study? [facility] at 4098 on 08/13/13 by Darrell Jewel, CMA If yes, date of  study? Do you currently use CPAP? Yes Yes at 1435 on 08/13/13 by Darrell Jewel, CMA If so, what pressure? 11 11 at 1435 on 08/13/13 by Darrell Jewel, CMA Do you wear oxygen at any time? No No at 1435 on 08/13/13 by Darrell Jewel, CMA   Review of Systems  Constitutional: Negative for fever and unexpected weight change.  HENT: Positive for congestion. Negative for dental problem, ear pain, nosebleeds, postnasal drip, rhinorrhea, sinus pressure, sneezing, sore throat and trouble swallowing.   Eyes: Negative for redness and itching.  Respiratory: Positive for cough. Negative for chest tightness, shortness of breath and wheezing.   Cardiovascular: Positive for palpitations and leg swelling.  Gastrointestinal: Negative for nausea and vomiting.  Genitourinary: Negative for dysuria.  Musculoskeletal: Negative for joint swelling.  Skin: Negative for rash.  Neurological: Negative for headaches.  Hematological: Does not bruise/bleed easily.  Psychiatric/Behavioral: Negative for dysphoric mood. The patient is not nervous/anxious.        Objective:   Physical Exam Constitutional:  Obese male, no acute distress  HENT:  Nares patent without discharge  Oropharynx without exudate, palate and uvula are thickened and elongated, small posterior space  Eyes:  Perrla, eomi, no scleral icterus  Neck:  No JVD, no TMG  Cardiovascular:  Normal rate, regular  rhythm, no rubs or gallops.  No murmurs        Intact distal pulses  Pulmonary :  Normal breath sounds, no stridor or respiratory distress   No rales, rhonchi, or wheezing  Abdominal:  Soft, nondistended, bowel sounds present.  No tenderness noted.   Musculoskeletal:  mild lower extremity edema noted.  Lymph Nodes:  No cervical lymphadenopathy noted  Skin:  No cyanosis noted  Neurologic:  Alert, appropriate, moves all 4 extremities without obvious deficit.         Assessment & Plan:

## 2013-08-16 ENCOUNTER — Encounter: Payer: 59 | Admitting: *Deleted

## 2013-09-02 ENCOUNTER — Encounter: Payer: Self-pay | Admitting: *Deleted

## 2013-09-06 ENCOUNTER — Other Ambulatory Visit: Payer: Self-pay | Admitting: Internal Medicine

## 2013-09-07 ENCOUNTER — Ambulatory Visit (INDEPENDENT_AMBULATORY_CARE_PROVIDER_SITE_OTHER): Payer: 59 | Admitting: *Deleted

## 2013-09-07 DIAGNOSIS — I4891 Unspecified atrial fibrillation: Secondary | ICD-10-CM

## 2013-09-14 LAB — MDC_IDC_ENUM_SESS_TYPE_REMOTE
Date Time Interrogation Session: 20150113231549
Lead Channel Impedance Value: 384 Ohm
Lead Channel Setting Sensing Sensitivity: 0.9 mV
MDC IDC MSMT BATTERY VOLTAGE: 2.96 V
MDC IDC MSMT LEADCHNL RV IMPEDANCE VALUE: 512 Ohm
MDC IDC SET LEADCHNL RV PACING AMPLITUDE: 2.5 V
MDC IDC SET LEADCHNL RV PACING PULSEWIDTH: 0.4 ms
MDC IDC STAT BRADY RV PERCENT PACED: 96.11 %
Zone Setting Detection Interval: 350 ms
Zone Setting Detection Interval: 400 ms

## 2013-09-16 ENCOUNTER — Other Ambulatory Visit: Payer: Self-pay | Admitting: Internal Medicine

## 2013-09-21 ENCOUNTER — Encounter: Payer: Self-pay | Admitting: *Deleted

## 2013-09-24 ENCOUNTER — Encounter: Payer: Self-pay | Admitting: Internal Medicine

## 2013-10-25 ENCOUNTER — Other Ambulatory Visit: Payer: Self-pay | Admitting: Internal Medicine

## 2013-10-31 ENCOUNTER — Other Ambulatory Visit: Payer: Self-pay | Admitting: Internal Medicine

## 2013-12-09 ENCOUNTER — Encounter: Payer: 59 | Admitting: *Deleted

## 2013-12-12 ENCOUNTER — Other Ambulatory Visit: Payer: Self-pay | Admitting: Internal Medicine

## 2013-12-13 ENCOUNTER — Ambulatory Visit (INDEPENDENT_AMBULATORY_CARE_PROVIDER_SITE_OTHER): Payer: 59 | Admitting: *Deleted

## 2013-12-13 ENCOUNTER — Encounter: Payer: Self-pay | Admitting: Internal Medicine

## 2013-12-13 DIAGNOSIS — I4891 Unspecified atrial fibrillation: Secondary | ICD-10-CM

## 2013-12-17 LAB — MDC_IDC_ENUM_SESS_TYPE_REMOTE
Lead Channel Impedance Value: 392 Ohm
Lead Channel Setting Pacing Amplitude: 2.5 V
Lead Channel Setting Sensing Sensitivity: 0.9 mV
MDC IDC MSMT BATTERY VOLTAGE: 2.96 V
MDC IDC SET LEADCHNL RV PACING PULSEWIDTH: 0.4 ms
Zone Setting Detection Interval: 350 ms
Zone Setting Detection Interval: 400 ms

## 2013-12-22 ENCOUNTER — Encounter: Payer: Self-pay | Admitting: *Deleted

## 2013-12-27 NOTE — Progress Notes (Signed)
PPM remote 

## 2014-01-02 ENCOUNTER — Other Ambulatory Visit: Payer: Self-pay | Admitting: Family Medicine

## 2014-01-08 ENCOUNTER — Other Ambulatory Visit: Payer: Self-pay | Admitting: Nurse Practitioner

## 2014-01-13 ENCOUNTER — Encounter: Payer: Self-pay | Admitting: Cardiology

## 2014-01-16 ENCOUNTER — Other Ambulatory Visit: Payer: Self-pay | Admitting: Internal Medicine

## 2014-01-21 ENCOUNTER — Other Ambulatory Visit: Payer: 59

## 2014-01-24 ENCOUNTER — Other Ambulatory Visit (INDEPENDENT_AMBULATORY_CARE_PROVIDER_SITE_OTHER): Payer: 59

## 2014-01-24 DIAGNOSIS — R739 Hyperglycemia, unspecified: Secondary | ICD-10-CM

## 2014-01-24 DIAGNOSIS — R7309 Other abnormal glucose: Secondary | ICD-10-CM

## 2014-01-24 LAB — HEMOGLOBIN A1C: Hgb A1c MFr Bld: 6.2 % (ref 4.6–6.5)

## 2014-01-24 LAB — GLUCOSE, RANDOM: Glucose, Bld: 120 mg/dL — ABNORMAL HIGH (ref 70–99)

## 2014-01-25 ENCOUNTER — Ambulatory Visit (INDEPENDENT_AMBULATORY_CARE_PROVIDER_SITE_OTHER): Payer: 59 | Admitting: Family Medicine

## 2014-01-25 ENCOUNTER — Encounter: Payer: Self-pay | Admitting: Family Medicine

## 2014-01-25 VITALS — BP 136/80 | HR 78 | Temp 98.0°F | Wt 303.8 lb

## 2014-01-25 DIAGNOSIS — I10 Essential (primary) hypertension: Secondary | ICD-10-CM

## 2014-01-25 DIAGNOSIS — M109 Gout, unspecified: Secondary | ICD-10-CM

## 2014-01-25 DIAGNOSIS — R7309 Other abnormal glucose: Secondary | ICD-10-CM

## 2014-01-25 DIAGNOSIS — R739 Hyperglycemia, unspecified: Secondary | ICD-10-CM

## 2014-01-25 MED ORDER — OXYCODONE-ACETAMINOPHEN 5-325 MG PO TABS: 1.0000 | ORAL_TABLET | Freq: Four times a day (QID) | ORAL | Status: DC | PRN

## 2014-01-25 NOTE — Patient Instructions (Signed)
Work on your [employer] and we'll recheck your labs in about 6 months.   Take care.

## 2014-01-25 NOTE — Assessment & Plan Note (Signed)
Reasonable control, continue to work on weight.  He agrees.

## 2014-01-25 NOTE — Progress Notes (Signed)
Pre visit review using our clinic review tool, if applicable. No additional management support is needed unless otherwise documented below in the visit note.  hyperglycemia No meds Hypoglycemic episodes: no checks, no sx Hyperglycemic episodes: no checks, no sx Feet problems:no Blood Sugars averaging:not checked eye exam within last year: has f/u this month, scheduled.    He had some elevated BPs, cut some calories and his BP improved.  Compliant with meds.  No CP but is relatively deconditioned.    Less gout flares with the allopurinol.  Colchicine and oxycodone stops the pain fairly quickly.  Needed a refill on the oxycodone.  Done and given to patient.    PMH and SH reviewed  Meds, vitals, and allergies reviewed.   ROS: See HPI.  Otherwise negative.    GEN: nad, alert and oriented HEENT: mucous membranes moist NECK: supple w/o LA CV: rrr. PULM: ctab, no inc wob ABD: soft, +bs EXT: no edema SKIN: no acute rash Chronic IP changes noted on hands.

## 2014-01-25 NOTE — Assessment & Plan Note (Signed)
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  He'll work on his diet/calorie intake. Labs d/w pt.

## 2014-01-25 NOTE — Assessment & Plan Note (Signed)
Less gout flares with the allopurinol.  Colchicine and oxycodone stops the pain fairly quickly.  Needed a refill on the oxycodone.  Done and given to patient.

## 2014-01-28 ENCOUNTER — Ambulatory Visit: Payer: 59 | Admitting: Family Medicine

## 2014-01-30 ENCOUNTER — Other Ambulatory Visit: Payer: Self-pay | Admitting: Family Medicine

## 2014-01-30 ENCOUNTER — Other Ambulatory Visit: Payer: Self-pay | Admitting: Internal Medicine

## 2014-01-31 ENCOUNTER — Other Ambulatory Visit: Payer: Self-pay | Admitting: *Deleted

## 2014-01-31 NOTE — Telephone Encounter (Signed)
Electronic refill request. Last Filled:   30 tablet 5 RF on   07/30/2013.  Please advise.

## 2014-02-01 NOTE — Telephone Encounter (Signed)
Please call in.  Thanks.   

## 2014-02-01 NOTE — Telephone Encounter (Signed)
Medication phoned to pharmacy.  

## 2014-02-06 ENCOUNTER — Other Ambulatory Visit: Payer: Self-pay | Admitting: Internal Medicine

## 2014-02-14 ENCOUNTER — Telehealth: Payer: Self-pay | Admitting: Internal Medicine

## 2014-02-14 ENCOUNTER — Telehealth: Payer: Self-pay

## 2014-02-14 NOTE — Telephone Encounter (Signed)
Noted, thanks!

## 2014-02-14 NOTE — Telephone Encounter (Signed)
Sonya with Dr Larna Daughters office said pt is scheduled for eye surgery  And will need medical clearance since pt has pacemaker; advised to contact Dr Hillis Range cardiologist office for pacemaker medical clearance. Lamar Laundry does not think will need clearance from PCP and cardiologist. Lamar Laundry will contact Dr Jenel Lucks office and if needs anything further from Dr Eugenia Pancoast will cb.

## 2014-02-14 NOTE — Telephone Encounter (Signed)
Lm for pt to call back re: her Lasix

## 2014-02-14 NOTE — Telephone Encounter (Signed)
Notified Dr. Ephriam Knuckles office that Dr. Myrtis Ser is not in the office this week.  They need medical clearance for retina surgery under local anesth on 8/12.  Dr. Luciana Axe wanted to be sure surgery is OK since she has a pacemaker.  Advised will forward to Dr. Myrtis Ser and his nurse Larita Fife Via,LPN to provide clearance.

## 2014-02-14 NOTE — Telephone Encounter (Signed)
New message    Medical clearance eye surgery in august.

## 2014-02-15 NOTE — Telephone Encounter (Signed)
Will forward message to Dennis Bast, RN as this is not Dr Myrtis Ser pt.

## 2014-02-17 ENCOUNTER — Encounter: Payer: Self-pay | Admitting: *Deleted

## 2014-02-17 NOTE — Telephone Encounter (Signed)
[name] [name] from Dr.Rankin's office and she has advised that the upcoming retina eye surgery scheduled for 8/12 under local anesthesia will NOT require a cautery. Advised per Dr.Allred that Dr.Rankin may proceed with the procedure. They require this approval in writing and to be faxed to [phone]. Mena Pauls that we will fax this within the next few days.

## 2014-02-17 NOTE — Telephone Encounter (Signed)
Discussed with Dr Johney Frame may proceed with eye surgery

## 2014-03-16 ENCOUNTER — Telehealth: Payer: Self-pay | Admitting: Cardiology

## 2014-03-16 ENCOUNTER — Encounter: Payer: 59 | Admitting: *Deleted

## 2014-03-16 NOTE — Telephone Encounter (Signed)
Called to confirm remote transmission for today. Pt stated that he is out of town for another 2 weeks and he will send transmission when he gets home.

## 2014-03-17 ENCOUNTER — Encounter: Payer: Self-pay | Admitting: Cardiology

## 2014-03-20 ENCOUNTER — Other Ambulatory Visit: Payer: Self-pay | Admitting: Internal Medicine

## 2014-03-27 ENCOUNTER — Encounter: Payer: Self-pay | Admitting: Internal Medicine

## 2014-03-27 DIAGNOSIS — I4891 Unspecified atrial fibrillation: Secondary | ICD-10-CM

## 2014-03-27 DIAGNOSIS — I498 Other specified cardiac arrhythmias: Secondary | ICD-10-CM

## 2014-03-27 LAB — MDC_IDC_ENUM_SESS_TYPE_REMOTE
Date Time Interrogation Session: 20150802205347
Lead Channel Impedance Value: 384 Ohm
Lead Channel Sensing Intrinsic Amplitude: 2.7597
Lead Channel Setting Pacing Pulse Width: 0.4 ms
Lead Channel Setting Sensing Sensitivity: 0.9 mV
MDC IDC MSMT BATTERY VOLTAGE: 2.95 V
MDC IDC MSMT LEADCHNL RV IMPEDANCE VALUE: 520 Ohm
MDC IDC SET LEADCHNL RV PACING AMPLITUDE: 2.5 V
MDC IDC STAT BRADY RV PERCENT PACED: 95.39 %
Zone Setting Detection Interval: 350 ms
Zone Setting Detection Interval: 400 ms

## 2014-03-28 ENCOUNTER — Ambulatory Visit (INDEPENDENT_AMBULATORY_CARE_PROVIDER_SITE_OTHER): Payer: 59 | Admitting: *Deleted

## 2014-03-28 DIAGNOSIS — I498 Other specified cardiac arrhythmias: Secondary | ICD-10-CM

## 2014-03-28 DIAGNOSIS — I4891 Unspecified atrial fibrillation: Secondary | ICD-10-CM

## 2014-03-29 NOTE — Progress Notes (Signed)
Remote pacemaker transmission.   

## 2014-04-04 ENCOUNTER — Encounter: Payer: Self-pay | Admitting: Gastroenterology

## 2014-04-07 ENCOUNTER — Encounter: Payer: Self-pay | Admitting: Cardiology

## 2014-04-11 ENCOUNTER — Encounter: Payer: Self-pay | Admitting: Cardiology

## 2014-04-18 ENCOUNTER — Other Ambulatory Visit: Payer: Self-pay | Admitting: *Deleted

## 2014-04-18 MED ORDER — ALLOPURINOL 300 MG PO TABS: ORAL_TABLET | ORAL | Status: DC

## 2014-04-24 ENCOUNTER — Other Ambulatory Visit: Payer: Self-pay | Admitting: Internal Medicine

## 2014-04-25 ENCOUNTER — Other Ambulatory Visit: Payer: Self-pay | Admitting: Internal Medicine

## 2014-05-08 ENCOUNTER — Other Ambulatory Visit: Payer: Self-pay | Admitting: Internal Medicine

## 2014-05-09 ENCOUNTER — Other Ambulatory Visit: Payer: Self-pay | Admitting: Internal Medicine

## 2014-05-15 ENCOUNTER — Other Ambulatory Visit: Payer: Self-pay | Admitting: Internal Medicine

## 2014-05-17 ENCOUNTER — Encounter: Payer: Self-pay | Admitting: Internal Medicine

## 2014-05-22 ENCOUNTER — Other Ambulatory Visit: Payer: Self-pay | Admitting: Internal Medicine

## 2014-05-25 ENCOUNTER — Encounter: Payer: 59 | Admitting: Internal Medicine

## 2014-06-05 ENCOUNTER — Other Ambulatory Visit: Payer: Self-pay | Admitting: Internal Medicine

## 2014-06-10 ENCOUNTER — Encounter: Payer: Self-pay | Admitting: Family Medicine

## 2014-06-10 ENCOUNTER — Ambulatory Visit (INDEPENDENT_AMBULATORY_CARE_PROVIDER_SITE_OTHER): Payer: 59 | Admitting: Family Medicine

## 2014-06-10 VITALS — BP 158/92 | HR 75 | Temp 98.3°F | Wt 298.5 lb

## 2014-06-10 DIAGNOSIS — Z4802 Encounter for removal of sutures: Secondary | ICD-10-CM

## 2014-06-10 NOTE — Patient Instructions (Signed)
Take care.  Trim the edges of the steristrips if need.  Glad to see you.
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
## 2014-06-12 DIAGNOSIS — Z4802 Encounter for removal of sutures: Secondary | ICD-10-CM | POA: Insufficient documentation

## 2014-06-12 NOTE — Assessment & Plan Note (Signed)
Tetanus done at the time of injury.  Covered with steristrips today with routine cautions. Would expect ROM to gradually return to normal.  F/u prn.  He agrees.

## 2014-06-15 ENCOUNTER — Encounter: Payer: Self-pay | Admitting: Internal Medicine

## 2014-06-15 ENCOUNTER — Ambulatory Visit (INDEPENDENT_AMBULATORY_CARE_PROVIDER_SITE_OTHER): Payer: 59 | Admitting: Internal Medicine

## 2014-06-15 ENCOUNTER — Other Ambulatory Visit: Payer: Self-pay | Admitting: Internal Medicine

## 2014-06-15 VITALS — BP 140/82 | HR 64 | Ht 72.0 in | Wt 296.0 lb

## 2014-06-15 DIAGNOSIS — I429 Cardiomyopathy, unspecified: Secondary | ICD-10-CM

## 2014-06-15 DIAGNOSIS — R001 Bradycardia, unspecified: Secondary | ICD-10-CM

## 2014-06-15 DIAGNOSIS — I2510 Atherosclerotic heart disease of native coronary artery without angina pectoris: Secondary | ICD-10-CM

## 2014-06-15 DIAGNOSIS — I4891 Unspecified atrial fibrillation: Secondary | ICD-10-CM

## 2014-06-15 DIAGNOSIS — I428 Other cardiomyopathies: Secondary | ICD-10-CM

## 2014-06-15 DIAGNOSIS — I482 Chronic atrial fibrillation, unspecified: Secondary | ICD-10-CM

## 2014-06-15 DIAGNOSIS — G4733 Obstructive sleep apnea (adult) (pediatric): Secondary | ICD-10-CM

## 2014-06-15 LAB — BASIC METABOLIC PANEL
BUN: 16 mg/dL (ref 6–23)
CHLORIDE: 102 meq/L (ref 96–112)
CO2: 35 mEq/L — ABNORMAL HIGH (ref 19–32)
Calcium: 10.6 mg/dL — ABNORMAL HIGH (ref 8.4–10.5)
Creatinine, Ser: 1.1 mg/dL (ref 0.4–1.5)
GFR: 71.18 mL/min (ref >60.00)
Glucose, Bld: 84 mg/dL (ref 70–99)
Potassium: 3.9 mEq/L (ref 3.5–5.1)
SODIUM: 142 meq/L (ref 135–145)

## 2014-06-15 LAB — CBC WITH DIFFERENTIAL/PLATELET
BASOS PCT: 0.9 % (ref 0.0–3.0)
Basophils Absolute: 0.1 10*3/uL (ref 0.0–0.1)
EOS ABS: 0.1 10*3/uL (ref 0.0–0.7)
EOS PCT: 1.6 % (ref 0.0–5.0)
HCT: 46.2 % (ref 39.0–52.0)
HEMOGLOBIN: 15.6 g/dL (ref 13.0–17.0)
Lymphocytes Relative: 24.3 % (ref 12.0–46.0)
Lymphs Abs: 2.1 10*3/uL (ref 0.7–4.0)
MCHC: 33.8 g/dL (ref 30.0–36.0)
MCV: 96.7 fl (ref 78.0–100.0)
MONO ABS: 0.8 10*3/uL (ref 0.1–1.0)
Monocytes Relative: 9.3 % (ref 3.0–12.0)
NEUTROS ABS: 5.6 10*3/uL (ref 1.4–7.7)
Neutrophils Relative %: 63.9 % (ref 43.0–77.0)
Platelets: 225 10*3/uL (ref 150.0–400.0)
RBC: 4.78 Mil/uL (ref 4.22–5.81)
RDW: 14.2 % (ref 11.5–15.5)
WBC: 8.8 10*3/uL (ref 4.0–10.5)

## 2014-06-15 LAB — MDC_IDC_ENUM_SESS_TYPE_INCLINIC
Battery Voltage: 2.93 V
Brady Statistic RV Percent Paced: 95.62 %
Date Time Interrogation Session: 20151021121212
Lead Channel Impedance Value: 400 Ohm
Lead Channel Impedance Value: 528 Ohm
Lead Channel Pacing Threshold Amplitude: 0.5 V
Lead Channel Pacing Threshold Pulse Width: 0.4 ms
Lead Channel Sensing Intrinsic Amplitude: 6.8992
Lead Channel Setting Sensing Sensitivity: 0.9 mV
MDC IDC MSMT LEADCHNL RA SENSING INTR AMPL: 0.6086
MDC IDC SET LEADCHNL RV PACING AMPLITUDE: 2.5 V
MDC IDC SET LEADCHNL RV PACING PULSEWIDTH: 0.4 ms
MDC IDC SET ZONE DETECTION INTERVAL: 350 ms
Zone Setting Detection Interval: 400 ms

## 2014-06-15 MED ORDER — RIVAROXABAN 20 MG PO TABS: 20.0000 mg | ORAL_TABLET | Freq: Every day | ORAL | Status: DC

## 2014-06-15 MED ORDER — AMLODIPINE BESYLATE 10 MG PO TABS: 10.0000 mg | ORAL_TABLET | Freq: Every day | ORAL | Status: DC

## 2014-06-15 NOTE — Progress Notes (Signed)
PCP: Crawford GivensGraham Duncan, MD  The patient presents today for routine electrophysiology followup.  Since last being seen in our clinic, the patient reports doing reasonably well.   He remains quite active. His SOB is stable.  He has not been able to lose weight.  He uses CPAP for sleep apnea. Today, he denies symptoms of palpitations, chest pain, orthopnea, PND, lower extremity edema, dizziness, presyncope, syncope, or neurologic sequela. The patient feels that he is tolerating medications without difficulties and is otherwise without complaint today.   Past Medical History  Diagnosis Date  . CAD (coronary artery disease)     s/p PCI 2003. Last stress test in Feb 2011 was normal  . Persistent atrial fibrillation     on Multaq and Pradaxa  . HTN (hypertension)   . ETOH abuse   . DJD (degenerative joint disease)     s/p Left TKA 06/2008  . Metabolic syndrome   . Hematuria   . Psychosexual dysfunction with inhibited sexual excitement   . Herpes zoster without mention of complication   . Hypertrophy of prostate without urinary obstruction and other lower urinary tract symptoms (LUTS)   . Anxiety state, unspecified   . Allergic rhinitis, cause unspecified   . OSA on CPAP     compliant with CPAP  . Obesity   . Hyperlipidemia   . GERD (gastroesophageal reflux disease)   . Sick sinus syndrome     s/p PPM by Dr Deborah Chalkennant  . Atrial flutter     s/p atrial flutter CTI ablation 10/26/10  . Hiatal hernia   . Gout   . Chronic kidney disease     Kidney Stones   Past Surgical History  Procedure Laterality Date  . Esophagogastroduodenoscopy  1992    Dr. Doreatha MartinSam  . Hiatal hernia repair  04/1998  . Balloon angioplasty, artery  2003    Ostium of the 2nd DX  . Cholecystectomy  1999    Dr. Luan PullingHardcastle  . Basal cell carcinoma excision  12/2003    On back  . Total knee arthroplasty  06/2008    Dr. Despina HickAlusio  . Pacemaker insertion  10/2009    [employer]  . Atrial flutter ablation  10/26/10  . Appendectomy    .  Cystoscopy      stone removal  . Pars plana vitrectomy  01/01/2012    Procedure: PARS PLANA VITRECTOMY WITH 20 GAUGE;  Surgeon: Edmon CrapeGary A Rankin, MD;  [facility];  Service: Ophthalmology;  Laterality: Right;  VITRECTOMY 25G, REMOVE SILICONE OIL, INJECTION OF SILICONE OIL 5000 TO RIGHT EYE, MEMBRANE PEEL, REPAIR COMPLEX RETINAL DETACHMENT, REMOVAL NON MAGNETIC FOREIGN BODY.  . Silicon oil removal  01/01/2012    Procedure: SILICON OIL REMOVAL;  Surgeon: Edmon CrapeGary A Rankin, MD;  [facility];  Service: Ophthalmology;  Laterality: Right;  INSERT SILICONE OIL    Current Outpatient Prescriptions  Medication Sig Dispense Refill  . allopurinol (ZYLOPRIM) 300 MG tablet TAKE 1 TABLET BY MOUTH EVERY DAY  30 tablet  0  . amLODipine (NORVASC) 10 MG tablet Take 1 tablet (10 mg total) by mouth daily.  90 tablet  3  . benazepril (LOTENSIN) 40 MG tablet TAKE 1 TABLET BY MOUTH TWICE DAILY  180 tablet  0  . carvedilol (COREG) 6.25 MG tablet TAKE 1 TABLET BY MOUTH TWICE DAILY  180 tablet  0  . colchicine 0.6 MG tablet Take 0.6 mg by mouth daily as needed (gout).       Marland Kitchen. doxazosin (CARDURA) 4  MG tablet Take 4 mg by mouth daily.      . famciclovir (FAMVIR) 500 MG tablet Take 3 tablets by mouth  daily as needed  270 tablet  1  . finasteride (PROSCAR) 5 MG tablet Take 5 mg by mouth daily.        Marland Kitchen FLUoxetine (PROZAC) 20 MG capsule Take 1 capsule by mouth  every day  90 capsule  0  . hydrochlorothiazide (HYDRODIURIL) 25 MG tablet TAKE 1 TABLET BY MOUTH EVERY DAY  30 tablet  0  . Multiple Vitamin (MULITIVITAMIN WITH MINERALS) TABS Take 1 tablet by mouth daily.      Marland Kitchen oxybutynin (DITROPAN XL) 15 MG 24 hr tablet Take 15 mg by mouth at bedtime.      Marland Kitchen oxyCODONE-acetaminophen (PERCOCET/ROXICET) 5-325 MG per tablet Take 1-2 tablets by mouth every 6 (six) hours as needed (pain).      Marland Kitchen zolpidem (AMBIEN) 10 MG tablet TAKE 1 TABLET BY MOUTH EVERY NIGHT AT BEDTIME AS NEEDED FOR SLEEP  30 tablet  5  . rivaroxaban (XARELTO) 20 MG  TABS tablet Take 1 tablet (20 mg total) by mouth daily with supper.  30 tablet  0   No current facility-administered medications for this visit.    Allergies  Allergen Reactions  . Morphine Sulfate Other (See Comments)    Feels like he's back in [place]    History   Social History  . Marital Status: Married    Spouse Name: N/A    Number of Children: 2  . Years of Education: N/A   Occupational History  . TRUCK DRIVER Counselling psychologist   Social History Main Topics  . Smoking status: Former Smoker -- 1.00 packs/day for 30 years    Types: Cigarettes    Quit date: 08/26/1990  . Smokeless tobacco: Never Used  . Alcohol Use: 3.6 oz/week    6 Cans of beer per week     Comment: moderate ETOH use, only drinks on weekends  . Drug Use: No  . Sexual Activity: Not on file   Other Topics Concern  . Not on file   Social History Narrative   2 caffeine drinks daily    ROS- all systems are reviewed and negative except as per HPI  Family History  Problem Relation Age of Onset  . Bone cancer Mother   . Dementia Mother   . Breast cancer Mother     metastatic   . Heart attack Father   . Hypertension Father   . Aneurysm Sister   . Colon cancer Neg Hx   . Anesthesia problems Neg Hx   . Prostate cancer Neg Hx    Physical Exam: Filed Vitals:   06/15/14 1158  BP: 140/82  Pulse: 64  Height: 6' (1.829 m)  Weight: 296 lb (134.265 kg)    GEN- The patient is overweight appearing, alert and oriented x 3 today.   Head- normocephalic, atraumatic Eyes-  Sclera clear, conjunctiva pink Ears- hearing intact Oropharynx- clear Neck- supple  Lungs- Clear to ausculation bilaterally, normal work of breathing Chest- pacemaker pocket is well healed Heart- irregular rate and rhythm GI- soft, NT, ND, + BS Extremities- no clubbing, cyanosis, or edema MS- no significant deformity or atrophy Skin- no rash or lesion Psych- euthymic mood, full affect Neuro- strength and sensation  are intact  Pacemaker interrogation- reviewed in detail today,  See PACEART report  Assessment and Plan:  1. SOB with activity Stable Cath from last year is reviewed Medical  management advised Weight loss encouraged His SOB may also  Be due to afib.  I discussed rhythm control options of tikosyn or amiodarone.  He would like to continue rate control at this time.  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 HTN Above goal Increase coreg to 10mg  daily  4. Permanent afib Rate controlled as above, he is not interested in [place] presently Continue long term anticoagulation.  He states "I take too many pills.  I want a daily blood thinner".  Today, I discussed Coumadin as well as novel anticoagulants including Pradaxa, Xarelto, Savaysa, and Eliquis today as indicated for risk reduction in stroke and systemic emboli with nonvalvular atrial fibrillation.  Risks, benefits, and alternatives to each of these drugs were discussed at length today.  He would prefer xarelto.  I will therefore start xarelto 20mg  daily. Check cbc and bmet.  5. CAD Medical therapy as above  6. symptomatic bradycardia Normal pacemaker function See Pace Art report No changes today  7. OSA Compliance with CPAP is encouraged  8. Obesity Body mass index is 40.14 kg/(m^2). Weight loss is encouraged  Follow-up with Lawson Fiscal in 6 months [facility] Return to see me in 1 year (unless needed sooner)

## 2014-06-15 NOTE — Patient Instructions (Addendum)
Your physician wants you to follow-up in: 6 months with Norma Fredrickson, NP and 12 months with Dr Jacquiline Doe will receive a reminder letter in the mail two months in advance. If you don't receive a letter, please call our office to schedule the follow-up appointment.  Your physician has recommended you make the following change in your medication:  1) Increase Amlodipine to 10mg  daily 2) Stop Pradaxa 3) Start Xarelto 20mg  daily  Your physician recommends that you return for lab work today:BMP/CBC

## 2014-06-20 ENCOUNTER — Other Ambulatory Visit: Payer: Self-pay | Admitting: Internal Medicine

## 2014-07-12 ENCOUNTER — Other Ambulatory Visit: Payer: Self-pay | Admitting: Internal Medicine

## 2014-07-26 ENCOUNTER — Other Ambulatory Visit: Payer: Self-pay | Admitting: Family Medicine

## 2014-07-26 DIAGNOSIS — Z8739 Personal history of other diseases of the musculoskeletal system and connective tissue: Secondary | ICD-10-CM

## 2014-07-26 DIAGNOSIS — I4891 Unspecified atrial fibrillation: Secondary | ICD-10-CM

## 2014-07-26 DIAGNOSIS — R739 Hyperglycemia, unspecified: Secondary | ICD-10-CM

## 2014-07-26 DIAGNOSIS — E785 Hyperlipidemia, unspecified: Secondary | ICD-10-CM

## 2014-07-28 ENCOUNTER — Other Ambulatory Visit: Payer: Self-pay | Admitting: Internal Medicine

## 2014-07-29 ENCOUNTER — Other Ambulatory Visit (INDEPENDENT_AMBULATORY_CARE_PROVIDER_SITE_OTHER): Payer: 59

## 2014-07-29 DIAGNOSIS — Z8739 Personal history of other diseases of the musculoskeletal system and connective tissue: Secondary | ICD-10-CM

## 2014-07-29 DIAGNOSIS — Z8639 Personal history of other endocrine, nutritional and metabolic disease: Secondary | ICD-10-CM

## 2014-07-29 DIAGNOSIS — I4891 Unspecified atrial fibrillation: Secondary | ICD-10-CM

## 2014-07-29 DIAGNOSIS — R739 Hyperglycemia, unspecified: Secondary | ICD-10-CM

## 2014-07-29 DIAGNOSIS — E785 Hyperlipidemia, unspecified: Secondary | ICD-10-CM

## 2014-07-31 LAB — COMPREHENSIVE METABOLIC PANEL
ALBUMIN: 4.2 g/dL (ref 3.5–5.2)
ALT: 30 U/L (ref 0–53)
AST: 34 U/L (ref 0–37)
Alkaline Phosphatase: 62 U/L (ref 39–117)
BUN: 16 mg/dL (ref 6–23)
CO2: 28 mEq/L (ref 19–32)
Calcium: 10 mg/dL (ref 8.4–10.5)
Chloride: 104 mEq/L (ref 96–112)
Creatinine, Ser: 1 mg/dL (ref 0.4–1.5)
GFR: 75.16 mL/min (ref >60.00)
GLUCOSE: 131 mg/dL — AB (ref 70–99)
POTASSIUM: 4.5 meq/L (ref 3.5–5.1)
SODIUM: 145 meq/L (ref 135–145)
TOTAL PROTEIN: 7.1 g/dL (ref 6.0–8.3)
Total Bilirubin: 1.1 mg/dL (ref 0.2–1.2)

## 2014-07-31 LAB — LIPID PANEL
CHOLESTEROL: 140 mg/dL (ref 0–200)
HDL: 32.3 mg/dL — ABNORMAL LOW (ref >39.00)
LDL Cholesterol: 75 mg/dL (ref 0–99)
NONHDL: 107.7
Total CHOL/HDL Ratio: 4
Triglycerides: 164 mg/dL — ABNORMAL HIGH (ref 0.0–149.0)
VLDL: 32.8 mg/dL (ref 0.0–40.0)

## 2014-07-31 LAB — HEMOGLOBIN A1C: Hgb A1c MFr Bld: 6.4 % (ref 4.6–6.5)

## 2014-07-31 LAB — URIC ACID: URIC ACID, SERUM: 6.4 mg/dL (ref 4.0–7.8)

## 2014-07-31 LAB — TSH: TSH: 1.38 u[IU]/mL (ref 0.35–4.50)

## 2014-08-01 ENCOUNTER — Other Ambulatory Visit: Payer: 59

## 2014-08-04 ENCOUNTER — Encounter (HOSPITAL_COMMUNITY): Payer: Self-pay | Admitting: Cardiovascular Disease

## 2014-08-05 ENCOUNTER — Encounter: Payer: 59 | Admitting: Family Medicine

## 2014-08-08 ENCOUNTER — Encounter: Payer: 59 | Admitting: Family Medicine

## 2014-08-13 ENCOUNTER — Other Ambulatory Visit: Payer: Self-pay | Admitting: Family Medicine

## 2014-09-02 DIAGNOSIS — G4733 Obstructive sleep apnea (adult) (pediatric): Secondary | ICD-10-CM | POA: Diagnosis not present

## 2014-09-05 ENCOUNTER — Encounter: Payer: 59 | Admitting: Family Medicine

## 2014-09-05 DIAGNOSIS — G4733 Obstructive sleep apnea (adult) (pediatric): Secondary | ICD-10-CM | POA: Diagnosis not present

## 2014-09-12 DIAGNOSIS — H439 Unspecified disorder of vitreous body: Secondary | ICD-10-CM | POA: Diagnosis not present

## 2014-09-12 DIAGNOSIS — H35372 Puckering of macula, left eye: Secondary | ICD-10-CM | POA: Diagnosis not present

## 2014-09-19 ENCOUNTER — Telehealth: Payer: Self-pay | Admitting: Cardiology

## 2014-09-19 ENCOUNTER — Ambulatory Visit (INDEPENDENT_AMBULATORY_CARE_PROVIDER_SITE_OTHER): Payer: 59 | Admitting: *Deleted

## 2014-09-19 DIAGNOSIS — I4891 Unspecified atrial fibrillation: Secondary | ICD-10-CM

## 2014-09-19 NOTE — Telephone Encounter (Signed)
Spoke with pt and reminded pt of remote transmission that is due today. Pt verbalized understanding.   

## 2014-09-20 ENCOUNTER — Encounter: Payer: Self-pay | Admitting: Cardiology

## 2014-09-20 ENCOUNTER — Encounter: Payer: Self-pay | Admitting: Internal Medicine

## 2014-09-20 DIAGNOSIS — I4891 Unspecified atrial fibrillation: Secondary | ICD-10-CM

## 2014-09-21 NOTE — Progress Notes (Signed)
Remote pacemaker transmission.   

## 2014-09-22 LAB — MDC_IDC_ENUM_SESS_TYPE_REMOTE
Date Time Interrogation Session: 20160127005505
Lead Channel Impedance Value: 384 Ohm
Lead Channel Setting Pacing Pulse Width: 0.4 ms
MDC IDC MSMT BATTERY VOLTAGE: 2.93 V
MDC IDC MSMT LEADCHNL RV IMPEDANCE VALUE: 512 Ohm
MDC IDC SET LEADCHNL RV PACING AMPLITUDE: 2.5 V
MDC IDC SET LEADCHNL RV SENSING SENSITIVITY: 0.9 mV
MDC IDC STAT BRADY RV PERCENT PACED: 97.18 %
Zone Setting Detection Interval: 350 ms
Zone Setting Detection Interval: 400 ms

## 2014-09-28 ENCOUNTER — Encounter: Payer: Self-pay | Admitting: Cardiology

## 2014-10-12 ENCOUNTER — Encounter: Payer: Self-pay | Admitting: Cardiology

## 2014-10-13 ENCOUNTER — Encounter: Payer: Self-pay | Admitting: Family Medicine

## 2014-10-13 ENCOUNTER — Ambulatory Visit (INDEPENDENT_AMBULATORY_CARE_PROVIDER_SITE_OTHER): Payer: 59 | Admitting: Family Medicine

## 2014-10-13 VITALS — BP 142/86 | HR 79 | Temp 98.7°F | Ht 72.0 in | Wt 302.0 lb

## 2014-10-13 DIAGNOSIS — R739 Hyperglycemia, unspecified: Secondary | ICD-10-CM | POA: Diagnosis not present

## 2014-10-13 DIAGNOSIS — Z0000 Encounter for general adult medical examination without abnormal findings: Secondary | ICD-10-CM | POA: Diagnosis not present

## 2014-10-13 DIAGNOSIS — I4891 Unspecified atrial fibrillation: Secondary | ICD-10-CM | POA: Diagnosis not present

## 2014-10-13 DIAGNOSIS — R4586 Emotional lability: Secondary | ICD-10-CM

## 2014-10-13 DIAGNOSIS — Z23 Encounter for immunization: Secondary | ICD-10-CM | POA: Diagnosis not present

## 2014-10-13 DIAGNOSIS — I10 Essential (primary) hypertension: Secondary | ICD-10-CM | POA: Diagnosis not present

## 2014-10-13 DIAGNOSIS — B029 Zoster without complications: Secondary | ICD-10-CM

## 2014-10-13 DIAGNOSIS — Z7189 Other specified counseling: Secondary | ICD-10-CM

## 2014-10-13 DIAGNOSIS — M109 Gout, unspecified: Secondary | ICD-10-CM

## 2014-10-13 MED ORDER — FAMCICLOVIR 500 MG PO TABS: ORAL_TABLET | ORAL | Status: DC

## 2014-10-13 MED ORDER — COLCHICINE 0.6 MG PO TABS: 0.6000 mg | ORAL_TABLET | Freq: Every day | ORAL | Status: AC | PRN

## 2014-10-13 MED ORDER — OXYCODONE-ACETAMINOPHEN 5-325 MG PO TABS: 1.0000 | ORAL_TABLET | Freq: Four times a day (QID) | ORAL | Status: DC | PRN

## 2014-10-13 NOTE — Patient Instructions (Signed)
Work on [employer] out of [employer].  Recheck sugar and labs in about 6 months before a visit.  Take care.  Glad to see you.

## 2014-10-13 NOTE — Progress Notes (Signed)
Pre visit review using our clinic review tool, if applicable. No additional management support is needed unless otherwise documented below in the visit note.  I have personally reviewed the Medicare Annual Wellness questionnaire and have noted 1. The patient's medical and social history 2. Their use of alcohol, tobacco or illicit drugs 3. Their current medications and supplements 4. The patient's functional ability including ADL's, fall risks, home safety risks and hearing or visual             impairment. 5. Diet and physical activities 6. Evidence for depression or mood disorders  The patients weight, height, BMI have been recorded in the chart and visual acuity is per eye clinic.  I have made referrals, counseling and provided education to the patient based review of the above and I have provided the pt with a written personalized care plan for preventive services.  Provider list updated- see scanned forms.  Routine anticipatory guidance given to patient.  See health maintenance.  Flu 2015 Shingles 2012 PNA 2007 Tetanus 2015 Colonoscopy 2010 Prostate cancer screening per uro Advance directive dw/ tp. Wife designated if patient is incapacitated.  Cognitive function addressed- see scanned forms- and if abnormal then additional documentation follows.   Hypertension:    Using medication without problems or lightheadedness: yes Chest pain with exertion:no Edema: occ, limited Short of breath: occ, likely due to weight, at baseline  AF on xarelto, no bleeding.  Has f/u with cards pending.  Still with ectopy noted by patient.    Gout, no flares, compliant with meds.  No ADE on meds.  Overall improved, needs refills on prn meds.   Mood is still fair, tolerable, some days better than others, but still clearly manageable with prozac.    Elevated sugar, not to DM2 by A1c but d/w pt.  Diet handout given and discussed.  Needs weight loss.    Using prn famvir when he has blistering on his  lower back, used episodically, needs refill.    PMH and SH reviewed  Meds, vitals, and allergies reviewed.   ROS: See HPI.  Otherwise negative.    GEN: nad, alert and oriented, obese HEENT: mucous membranes moist NECK: supple w/o LA CV: rrr with occ ectopy noted PULM: ctab, no inc wob ABD: soft, +bs EXT: no edema SKIN: no acute rash Chronic IP changes noted on the hands.

## 2014-10-14 DIAGNOSIS — Z7189 Other specified counseling: Secondary | ICD-10-CM | POA: Insufficient documentation

## 2014-10-14 DIAGNOSIS — R4586 Emotional lability: Secondary | ICD-10-CM | POA: Insufficient documentation

## 2014-10-14 NOTE — Assessment & Plan Note (Signed)
Per cards  

## 2014-10-14 NOTE — Assessment & Plan Note (Addendum)
dw pt about diet and exercise, limiting carbs, handout given.  He agrees he needs to work on this.  Recheck labs in about 6 months.

## 2014-10-14 NOTE — Assessment & Plan Note (Signed)
No sx now, can use prn famvir.

## 2014-10-14 NOTE — Assessment & Plan Note (Signed)
D/w pt about diet and weight.

## 2014-10-14 NOTE — Assessment & Plan Note (Signed)
Flu 2015 Shingles 2012 PNA 2007 Tetanus 2015 Colonoscopy 2010 Prostate cancer screening per uro Advance directive dw/ tp. Wife designated if patient is incapacitated.  Cognitive function addressed- see scanned forms- and if abnormal then additional documentation follows.

## 2014-10-14 NOTE — Assessment & Plan Note (Signed)
Nearly controlled, continue current meds. Needs weight reduction.

## 2014-10-14 NOTE — Assessment & Plan Note (Signed)
Mood is still fair, tolerable, some days better than others, but still clearly manageable with prozac.  Would continue as is.  He agrees.

## 2014-10-14 NOTE — Assessment & Plan Note (Signed)
No recent flares, compliant with meds. No ADE on meds. Overall improved, needs refills on prn meds.

## 2014-10-15 ENCOUNTER — Telehealth: Payer: Self-pay | Admitting: Family Medicine

## 2014-10-15 NOTE — Telephone Encounter (Signed)
emmi emailed °

## 2014-10-25 ENCOUNTER — Encounter: Payer: Self-pay | Admitting: Pulmonary Disease

## 2014-10-25 ENCOUNTER — Ambulatory Visit (INDEPENDENT_AMBULATORY_CARE_PROVIDER_SITE_OTHER): Payer: 59 | Admitting: Pulmonary Disease

## 2014-10-25 VITALS — BP 124/70 | HR 71 | Temp 97.0°F | Ht 72.0 in | Wt 303.8 lb

## 2014-10-25 DIAGNOSIS — G4733 Obstructive sleep apnea (adult) (pediatric): Secondary | ICD-10-CM | POA: Diagnosis not present

## 2014-10-25 NOTE — Assessment & Plan Note (Signed)
The patient has a history of very severe obstructive sleep apnea, and is now having an issue with frequent awakenings at night and nonrestorative sleep. He is wearing his device compliantly, and denies any issues with mask leak. He does have an older machine, and feels that it may not be working properly. I will send an order to his home care company to see if he qualifies for a new device. I will also get a download off his current machine to see if we can figure out why his sleep is being fragmented. Finally, I have encouraged him to work aggressively on weight loss.

## 2014-10-25 NOTE — Progress Notes (Signed)
   Subjective:    Patient ID: [name] [name], male    DOB: 05-22-1941, 74 y.o.   MRN: [id number]  HPI Patient comes in today for follow-up of his obstructive sleep apnea. He is been wearing his device compliantly and keeping up with supplies, but has noticed worsening sleep. He is having frequent awakenings at night, and nonrestorative sleep in the mornings. He is concerned that his machine is not working properly, and it is almost 74 years old. Do not have a recent download off his device. He is not having any mask leak issues, and feels the fit is adequate.   Review of Systems  Constitutional: Negative for fever and unexpected weight change.  HENT: Negative for congestion, dental problem, ear pain, nosebleeds, postnasal drip, rhinorrhea, sinus pressure, sneezing, sore throat and trouble swallowing.   Eyes: Negative for redness and itching.  Respiratory: Negative for cough, chest tightness, shortness of breath and wheezing.   Cardiovascular: Negative for palpitations and leg swelling.  Gastrointestinal: Negative for nausea and vomiting.  Genitourinary: Negative for dysuria.  Musculoskeletal: Negative for joint swelling.  Skin: Negative for rash.  Neurological: Negative for headaches.  Hematological: Does not bruise/bleed easily.  Psychiatric/Behavioral: Negative for dysphoric mood. The patient is not nervous/anxious.        Objective:   Physical Exam Morbidly obese male in no acute distress Nose without purulence or discharge noted Neck without lymphadenopathy or thyromegaly No skin breakdown or pressure necrosis from the C Pap mask Lower extremities with mild edema, no cyanosis Alert and oriented, moves all 4 extremities.       Assessment & Plan:

## 2014-10-25 NOTE — Patient Instructions (Signed)
Will have your home care company get a download off your machine, and send to [place].  If you do not hear from [place] in one week after doing this, let us know Will send order to get you a new device if insurance will allow Work on weight loss followup with me again in one year if doing well.

## 2014-10-28 ENCOUNTER — Encounter: Payer: Self-pay | Admitting: Family Medicine

## 2014-11-14 ENCOUNTER — Telehealth: Payer: Self-pay | Admitting: Pulmonary Disease

## 2014-11-14 NOTE — Telephone Encounter (Signed)
Let pt know that his download off his old machine shows persistent apnea despite being on the auto setting.  There is no significant mask leak.  I think he does have a problem with his machine sensing his apneas and adjusting pressure properly.  See if he ever got a new machine??  If so, is he sleeping better?

## 2014-11-14 NOTE — Telephone Encounter (Signed)
I called pt. He reports he took his old machine and turned it in and filled out the paperwork to get new machine into [facility]. Pt reports he worked with [employer].  I have sent message to [facility].

## 2014-11-14 NOTE — Telephone Encounter (Signed)
[name] [name]  [name], CMA           We have determined that his current unit is no longer being repaired. We will move forward with getting him a new machine. I'll let you know if we need anything else.  Thanks   --  Pt is aware of above. Nothing further needed

## 2014-11-15 ENCOUNTER — Encounter: Payer: Self-pay | Admitting: Nurse Practitioner

## 2014-11-15 ENCOUNTER — Ambulatory Visit (INDEPENDENT_AMBULATORY_CARE_PROVIDER_SITE_OTHER): Payer: 59 | Admitting: Nurse Practitioner

## 2014-11-15 VITALS — BP 132/88 | HR 64 | Ht 72.0 in | Wt 307.8 lb

## 2014-11-15 DIAGNOSIS — I429 Cardiomyopathy, unspecified: Secondary | ICD-10-CM

## 2014-11-15 DIAGNOSIS — I482 Chronic atrial fibrillation, unspecified: Secondary | ICD-10-CM

## 2014-11-15 DIAGNOSIS — I428 Other cardiomyopathies: Secondary | ICD-10-CM

## 2014-11-15 DIAGNOSIS — I4891 Unspecified atrial fibrillation: Secondary | ICD-10-CM

## 2014-11-15 DIAGNOSIS — G4733 Obstructive sleep apnea (adult) (pediatric): Secondary | ICD-10-CM

## 2014-11-15 NOTE — Progress Notes (Signed)
CARDIOLOGY OFFICE NOTE  Date:  11/15/2014    [name] [name] Date of Birth: May 19, 1941 Medical Record #[id number]  PCP:  Crawford Givens, MD  Cardiologist:  Allred/Joud Pettinato    Chief Complaint  Patient presents with  . Atrial Fibrillation    6 month follow up visit - seen for Dr. Johney Frame.      History of Present Illness: [name] [name] is a 74 y.o. male who presents today for a 6 month check. Seen for Dr. Johney Frame. Former patient of Dr. Ronnald Nian. He has multiple issues which include CAD with remote PCI in 2003, chronic atrial fib, HTN, ETOH abuse, OSA on CPAP, obesity, HLD, GERD, SSS with PPM, past atrial flutter ablation, gout, and a hiatal hernia. Other issues as noted below.   Seen in September of 2014 by Dr. Johney Frame - complaining of exertional DOE - Lexiscan ordered - inferior scar with possible peri-infarct ischemia which was new compared to prior Myoview per Dr. Deborah Chalk - was recathed - continued with medical management.   Last seen in October. Was switched to Xarelto. Coreg increased for BP. Encouraged to lose weight which has always been a chronic issue. [name] continued to opt for rate control.   Comes back today. Here alone. Doing well. He has issues with fatigue and dyspnea - seems to be at his baseline. His weight continues to climb. Really likes his beer - having at least 2 to 3 per day - we looked up the calories - about 100 calories per can - so about every 2 weeks he gains a pound. He says he is active. Walks a lot with his job. No chest pain. Wondering if going on Tikosyn would help him feel better. No problems with his Xarelto. He is now borderline diabetic.  Past Medical History  Diagnosis Date  . CAD (coronary artery disease)     s/p PCI 2003. Last stress test in Feb 2011 was normal  . Persistent atrial fibrillation     on Multaq and Pradaxa  . HTN (hypertension)   . ETOH abuse   . DJD (degenerative joint disease)     s/p Left TKA 06/2008  . Metabolic syndrome    . Hematuria   . Psychosexual dysfunction with inhibited sexual excitement   . Hypertrophy of prostate without urinary obstruction and other lower urinary tract symptoms (LUTS)   . Anxiety state, unspecified   . Allergic rhinitis, cause unspecified   . OSA on CPAP     compliant with CPAP  . Obesity   . Hyperlipidemia   . GERD (gastroesophageal reflux disease)   . Sick sinus syndrome     s/p PPM by Dr Deborah Chalk  . Atrial flutter     s/p atrial flutter CTI ablation 10/26/10  . Hiatal hernia   . Gout   . Chronic kidney disease     Kidney Stones    Past Surgical History  Procedure Laterality Date  . Esophagogastroduodenoscopy  1992    Dr. Doreatha Martin  . Hiatal hernia repair  04/1998  . Balloon angioplasty, artery  2003    Ostium of the 2nd DX  . Cholecystectomy  1999    Dr. Luan Pulling  . Basal cell carcinoma excision  12/2003    On back  . Total knee arthroplasty  06/2008    Dr. Despina Hick  . Pacemaker insertion  10/2009    [employer]  . Atrial flutter ablation  10/26/10  . Appendectomy    . Cystoscopy  stone removal  . Pars plana vitrectomy  01/01/2012    Procedure: PARS PLANA VITRECTOMY WITH 20 GAUGE;  Surgeon: Edmon Crape, MD;  Location: [facility] OR;  Service: Ophthalmology;  Laterality: Right;  VITRECTOMY 25G, REMOVE SILICONE OIL, INJECTION OF SILICONE OIL 5000 TO RIGHT EYE, MEMBRANE PEEL, REPAIR COMPLEX RETINAL DETACHMENT, REMOVAL NON MAGNETIC FOREIGN BODY.  . Silicon oil removal  01/01/2012    Procedure: SILICON OIL REMOVAL;  Surgeon: Edmon Crape, MD;  Location: [facility] OR;  Service: Ophthalmology;  Laterality: Right;  INSERT SILICONE OIL  . Left heart catheterization with coronary angiogram N/A 06/25/2013    Procedure: LEFT HEART CATHETERIZATION WITH CORONARY ANGIOGRAM;  Surgeon: Micheline Chapman, MD;  Location: [facility] CATH LAB;  Service: Cardiovascular;  Laterality: N/A;  . Hernia repair       Medications: Current Outpatient Prescriptions  Medication Sig Dispense Refill  . allopurinol  (ZYLOPRIM) 300 MG tablet TAKE 1 TABLET BY MOUTH EVERY DAY 30 tablet 11  . amLODipine (NORVASC) 10 MG tablet Take 1 tablet (10 mg total) by mouth daily. 90 tablet 3  . amoxicillin (AMOXIL) 500 MG tablet 500 mg. Prior to dental work    . benazepril (LOTENSIN) 40 MG tablet TAKE 1 TABLET BY MOUTH TWICE DAILY 180 tablet 1  . carvedilol (COREG) 6.25 MG tablet TAKE 1 TABLET BY MOUTH TWICE DAILY 180 tablet 0  . colchicine 0.6 MG tablet Take 1 tablet (0.6 mg total) by mouth daily as needed (gout). 30 tablet 2  . doxazosin (CARDURA) 8 MG tablet Once daily  10  . famciclovir (FAMVIR) 500 MG tablet Take 3 tablets by mouth  daily as needed 21 tablet 12  . finasteride (PROSCAR) 5 MG tablet Take 5 mg by mouth daily.      Marland Kitchen FLUoxetine (PROZAC) 20 MG capsule TAKE 1 CAPSULE BY MOUTH EVERY DAY 90 capsule 0  . hydrochlorothiazide (HYDRODIURIL) 25 MG tablet TAKE 1 TABLET BY MOUTH EVERY DAY 30 tablet 11  . Multiple Vitamin (MULITIVITAMIN WITH MINERALS) TABS Take 1 tablet by mouth daily.    Marland Kitchen oxybutynin (DITROPAN XL) 15 MG 24 hr tablet Take 15 mg by mouth at bedtime.    Marland Kitchen oxyCODONE-acetaminophen (PERCOCET/ROXICET) 5-325 MG per tablet Take 1-2 tablets by mouth every 6 (six) hours as needed (pain). 30 tablet 0  . XARELTO 20 MG TABS tablet TAKE 1 TABLET BY MOUTH DAILY WITH SUPPER 30 tablet 6  . zolpidem (AMBIEN) 10 MG tablet TAKE 1 TABLET BY MOUTH EVERY NIGHT AT BEDTIME AS NEEDED FOR SLEEP 30 tablet 5   No current facility-administered medications for this visit.    Allergies: Allergies  Allergen Reactions  . Morphine Sulfate Other (See Comments)    Feels like he's back in [place]    Social History: The patient  reports that he quit smoking about 24 years ago. His smoking use included Cigarettes. He has a 30 pack-year smoking history. He has never used smokeless tobacco. He reports that he drinks about 3.6 oz of alcohol per week. He reports that he does not use illicit drugs.   Family History: The patient's  family history includes Aneurysm in his sister; Breast cancer in his mother; Dementia in his mother; Heart attack in his father; Hypertension in his father. There is no history of Colon cancer, Anesthesia problems, or Prostate cancer.   Review of Systems: Please see the history of present illness.   Otherwise, the review of systems is positive for irregular heart beats.   All other systems  are reviewed and negative.   Physical Exam: VS:  BP 132/88 mmHg  Pulse 64  Ht 6' (1.829 m)  Wt 307 lb 12.8 oz (139.617 kg)  BMI 41.74 kg/m2 .  BMI Body mass index is 41.74 kg/(m^2).  Wt Readings from Last 3 Encounters:  11/15/14 307 lb 12.8 oz (139.617 kg)  10/25/14 303 lb 12.8 oz (137.803 kg)  10/13/14 302 lb (136.986 kg)    General: Pleasant. Well developed, well nourished and in no acute distress. He is obese. Weight up 5 pounds over this past month. HEENT: Normal. Neck: Supple, no JVD, carotid bruits, or masses noted.  Cardiac: Fairly regular rhythm - presumed paced.  No murmurs, rubs, or gallops. No edema.  Respiratory:  Lungs are clear to auscultation bilaterally with normal work of breathing.  GI: Soft and nontender.  MS: No deformity or atrophy. Gait and ROM intact. Skin: Warm and dry. Color is normal.  Neuro:  Strength and sensation are intact and no gross focal deficits noted.  Psych: Alert, appropriate and with normal affect.   LABORATORY DATA:  EKG:  EKG is ordered today. This demonstrates V pacing with underlying AF.  Lab Results  Component Value Date   WBC 8.8 06/15/2014   HGB 15.6 06/15/2014   HCT 46.2 06/15/2014   PLT 225.0 06/15/2014   GLUCOSE 131* 07/29/2014   CHOL 140 07/29/2014   TRIG 164.0* 07/29/2014   HDL 32.30* 07/29/2014   LDLDIRECT 51.8 11/28/2010   LDLCALC 75 07/29/2014   ALT 30 07/29/2014   AST 34 07/29/2014   NA 145 07/29/2014   K 4.5 07/29/2014   CL 104 07/29/2014   CREATININE 1.0 07/29/2014   BUN 16 07/29/2014   CO2 28 07/29/2014   TSH 1.38  07/29/2014   PSA 0.30 05/27/2012   INR 1.15 06/25/2013   HGBA1C 6.4 07/29/2014    BNP (last 3 results) No results for input(s): BNP in the last 8760 hours.  ProBNP (last 3 results) No results for input(s): PROBNP in the last 8760 hours.   Other Studies Reviewed Today:  Coronary angiography from October 2014:  Coronary dominance: right  Left mainstem: The left mainstem arises from the left cusp. There is mild calcification. There is diffuse irregularity but no significant stenosis present.  Left anterior descending (LAD): The LAD is mildly calcified. There are diffuse luminal irregularities without significant stenoses. The vessel reaches the left ventricular apex. The first and second diagonal branches both have ostial stenoses. The first diagonal has 70% stenosis. The second diagonal has 80% ostial stenosis. Both vessels are small to medium in caliber.  Left circumflex (LCx): There is a large ramus intermedius branch. There is no significant stenosis of this branch noted. The AV groove circumflex is small without significant stenosis.  Right coronary artery (RCA): The RCA is dominant. The proximal vessel has 20% stenosis. The mid vessel has 30% stenosis. The PDA and PLA branches are patent.  Left ventriculography: Left ventricular systolic function is low-normal, LVEF is estimated at 50%, there is no significant mitral regurgitation   Final Conclusions:  1. Mild nonobstructive coronary artery disease as detailed above  2. Low normal left ventricular systolic function   Recommendations: Medical therapy. The patient has stenosis of the diagonal branches of his LAD. These branches are fairly small and do not correlate with this perfusion defect on the Myoview scan. I do not think they're contributing to his shortness of breath. Medical therapy is recommended.   Tonny Bollman  06/25/2013, 9:54 AM  Echo Study Conclusions from 05/2013 - Left ventricle: The cavity size was  normal. Wall thickness was increased in a pattern of moderate LVH. Systolic function was mildly reduced. The estimated ejection fraction was in the range of 45% to 50%. Wall motion was normal; there were no regional wall motion abnormalities. - Left atrium: The atrium was moderately dilated. - Right atrium: The atrium was mildly dilated.  Assessment / Plan: 1. CAD - no active symptoms - to continue with medical management.   2. Dyspnea - multifactorial - really needs to work on his weight -  We have discussed this again at length. Needs to stop the alcohol.   3. Chronic atrial fib - managed with rate control and anticoagulation - we have discussed going on Tikosyn - I doubt this will help him feel better - I think he needs to work on his weight in order to feel better. He has been in AF for so long and has biatrial enlargement.   4. HTN - BP looks ok on his current regimen.   5. OSA - on CPAP   6. Underlying PPM - followed by Dr. Johney Frame  7. Borderline DM  Current medicines are reviewed with the patient today.  The patient does not have concerns regarding medicines other than what has been noted above.  The following changes have been made:  See above.  Labs/ tests ordered today include:    Orders Placed This Encounter  Procedures  . EKG 12-Lead   Disposition:   FU with me in 6 months  Patient is agreeable to this plan and will call if any problems develop in the interim.   Signed: Rosalio Macadamia, RN, ANP-C 11/15/2014 8:36 AM  [facility] [address] [place], [place]  [address] Phone: [phone] Fax: [phone]

## 2014-11-15 NOTE — Patient Instructions (Addendum)
Stay on your current medicines  See me in 6 months  Think about what we talked about today  Call the [facility] office at [phone] if you have any questions, problems or concerns.

## 2014-12-01 DIAGNOSIS — G4733 Obstructive sleep apnea (adult) (pediatric): Secondary | ICD-10-CM | POA: Diagnosis not present

## 2014-12-02 DIAGNOSIS — G4733 Obstructive sleep apnea (adult) (pediatric): Secondary | ICD-10-CM | POA: Diagnosis not present

## 2014-12-04 ENCOUNTER — Other Ambulatory Visit: Payer: Self-pay | Admitting: Family Medicine

## 2014-12-13 DIAGNOSIS — T85310A Breakdown (mechanical) of prosthetic orbit of right eye, initial encounter: Secondary | ICD-10-CM | POA: Diagnosis not present

## 2014-12-13 DIAGNOSIS — H439 Unspecified disorder of vitreous body: Secondary | ICD-10-CM | POA: Diagnosis not present

## 2014-12-13 DIAGNOSIS — H35372 Puckering of macula, left eye: Secondary | ICD-10-CM | POA: Diagnosis not present

## 2014-12-21 ENCOUNTER — Telehealth: Payer: Self-pay | Admitting: Cardiology

## 2014-12-21 ENCOUNTER — Encounter: Payer: 59 | Admitting: *Deleted

## 2014-12-21 NOTE — Telephone Encounter (Signed)
Spoke with pt and reminded pt of remote transmission that is due today. Pt verbalized understanding.   

## 2014-12-22 ENCOUNTER — Encounter: Payer: Self-pay | Admitting: Cardiology

## 2014-12-22 ENCOUNTER — Other Ambulatory Visit: Payer: Self-pay | Admitting: Orthopedic Surgery

## 2014-12-22 DIAGNOSIS — M25552 Pain in left hip: Secondary | ICD-10-CM | POA: Diagnosis not present

## 2014-12-22 DIAGNOSIS — M7072 Other bursitis of hip, left hip: Secondary | ICD-10-CM | POA: Diagnosis not present

## 2014-12-26 ENCOUNTER — Ambulatory Visit (INDEPENDENT_AMBULATORY_CARE_PROVIDER_SITE_OTHER): Payer: 59 | Admitting: *Deleted

## 2014-12-26 DIAGNOSIS — I482 Chronic atrial fibrillation, unspecified: Secondary | ICD-10-CM

## 2014-12-28 DIAGNOSIS — R040 Epistaxis: Secondary | ICD-10-CM | POA: Diagnosis not present

## 2014-12-28 DIAGNOSIS — H44751 Retained (nonmagnetic) (old) foreign body in vitreous body, right eye: Secondary | ICD-10-CM | POA: Diagnosis not present

## 2014-12-28 DIAGNOSIS — H5461 Unqualified visual loss, right eye, normal vision left eye: Secondary | ICD-10-CM | POA: Diagnosis not present

## 2014-12-28 DIAGNOSIS — H5462 Unqualified visual loss, left eye, normal vision right eye: Secondary | ICD-10-CM | POA: Diagnosis not present

## 2014-12-28 DIAGNOSIS — H43391 Other vitreous opacities, right eye: Secondary | ICD-10-CM | POA: Diagnosis not present

## 2014-12-28 DIAGNOSIS — H439 Unspecified disorder of vitreous body: Secondary | ICD-10-CM | POA: Diagnosis not present

## 2014-12-28 NOTE — Progress Notes (Signed)
Remote pacemaker transmission.   

## 2015-01-01 ENCOUNTER — Other Ambulatory Visit: Payer: Self-pay | Admitting: Internal Medicine

## 2015-01-01 LAB — CUP PACEART REMOTE DEVICE CHECK
Date Time Interrogation Session: 20160501135539
Lead Channel Sensing Intrinsic Amplitude: 2.07 mV
Lead Channel Setting Pacing Amplitude: 2.5 V
Lead Channel Setting Pacing Pulse Width: 0.4 ms
MDC IDC MSMT BATTERY VOLTAGE: 2.92 V
MDC IDC MSMT LEADCHNL RA IMPEDANCE VALUE: 368 Ohm
MDC IDC MSMT LEADCHNL RV IMPEDANCE VALUE: 504 Ohm
MDC IDC SET LEADCHNL RV SENSING SENSITIVITY: 0.9 mV
MDC IDC STAT BRADY RV PERCENT PACED: 98.11 %
Zone Setting Detection Interval: 350 ms
Zone Setting Detection Interval: 400 ms

## 2015-01-05 ENCOUNTER — Encounter: Payer: Self-pay | Admitting: Cardiology

## 2015-01-11 ENCOUNTER — Encounter: Payer: Self-pay | Admitting: Internal Medicine

## 2015-01-11 DIAGNOSIS — R040 Epistaxis: Secondary | ICD-10-CM | POA: Diagnosis not present

## 2015-01-15 ENCOUNTER — Other Ambulatory Visit: Payer: Self-pay | Admitting: Family Medicine

## 2015-01-16 ENCOUNTER — Encounter: Payer: Self-pay | Admitting: Pulmonary Disease

## 2015-01-16 ENCOUNTER — Ambulatory Visit (INDEPENDENT_AMBULATORY_CARE_PROVIDER_SITE_OTHER): Payer: 59 | Admitting: Pulmonary Disease

## 2015-01-16 VITALS — BP 138/64 | HR 68 | Temp 98.0°F | Ht 72.0 in | Wt 298.2 lb

## 2015-01-16 DIAGNOSIS — G4733 Obstructive sleep apnea (adult) (pediatric): Secondary | ICD-10-CM

## 2015-01-16 NOTE — Progress Notes (Signed)
   Subjective:    Patient ID: [name] [name], male    DOB: Apr 15, 1941, 74 y.o.   MRN: [id number]  HPI The patient comes in today for follow-up of his obstructive sleep apnea. His recently got a new device on the auto setting, and is doing very well. His download shows excellent compliance, and good control of his AHI. The patient does note some issues with his mask fit, and has been unable to find a newer mask that fits as well as his older mask that has been discontinued.   Review of Systems  Constitutional: Negative for fever and unexpected weight change.  HENT: Negative for congestion, dental problem, ear pain, nosebleeds, postnasal drip, rhinorrhea, sinus pressure, sneezing, sore throat and trouble swallowing.   Eyes: Negative for redness and itching.  Respiratory: Negative for cough, chest tightness, shortness of breath and wheezing.   Cardiovascular: Negative for palpitations and leg swelling.  Gastrointestinal: Negative for nausea and vomiting.  Genitourinary: Negative for dysuria.  Musculoskeletal: Negative for joint swelling.  Skin: Negative for rash.  Neurological: Negative for headaches.  Hematological: Does not bruise/bleed easily.  Psychiatric/Behavioral: Negative for dysphoric mood. The patient is not nervous/anxious.        Objective:   Physical Exam Obese male in no acute distress Nose without purulence or discharge noted Neck without lymphadenopathy or thyromegaly No skin breakdown or pressure necrosis from the C Pap mask Lower extremities with mild edema, no cyanosis Alert and oriented, moves all 4 extremities.       Assessment & Plan:

## 2015-01-16 NOTE — Assessment & Plan Note (Signed)
The patient is continuing to be compliant with his C Pap device, and has excellent control of his AHI on his recent download. He is complaining about his current mask fit, but is not having significant leak on his download. He likes his new device, and feels that he sleeps well with it. I have encouraged him to continue working on weight loss, and to keep up with mask changes and supplies.

## 2015-01-16 NOTE — Patient Instructions (Signed)
Continue on cpap at the current settings, but will refer to sleep center for a mask fitting. Work on weight loss followup in one year with Dr. Craige Cotta.

## 2015-01-17 DIAGNOSIS — G4733 Obstructive sleep apnea (adult) (pediatric): Secondary | ICD-10-CM | POA: Diagnosis not present

## 2015-01-25 ENCOUNTER — Ambulatory Visit (HOSPITAL_BASED_OUTPATIENT_CLINIC_OR_DEPARTMENT_OTHER): Payer: 59 | Attending: Pulmonary Disease

## 2015-01-25 DIAGNOSIS — G4733 Obstructive sleep apnea (adult) (pediatric): Secondary | ICD-10-CM

## 2015-01-26 DIAGNOSIS — L570 Actinic keratosis: Secondary | ICD-10-CM | POA: Diagnosis not present

## 2015-02-12 ENCOUNTER — Other Ambulatory Visit: Payer: Self-pay | Admitting: Internal Medicine

## 2015-02-25 ENCOUNTER — Other Ambulatory Visit: Payer: Self-pay | Admitting: Internal Medicine

## 2015-02-28 ENCOUNTER — Telehealth: Payer: Self-pay | Admitting: Interventional Cardiology

## 2015-02-28 NOTE — Telephone Encounter (Signed)
error 

## 2015-03-14 ENCOUNTER — Telehealth: Payer: Self-pay

## 2015-03-14 NOTE — Telephone Encounter (Signed)
Prior auth for Xarelto 20mg sent to [facility] via Cover My Meds. 

## 2015-03-14 NOTE — Telephone Encounter (Signed)
Opened in error

## 2015-03-15 ENCOUNTER — Telehealth: Payer: Self-pay

## 2015-03-15 NOTE — Telephone Encounter (Signed)
Approval of Xarelto 20mg  from [facility]. Good through 03/13/2016. PA# [id number].

## 2015-03-27 ENCOUNTER — Ambulatory Visit (INDEPENDENT_AMBULATORY_CARE_PROVIDER_SITE_OTHER): Payer: Commercial Managed Care - PPO | Admitting: *Deleted

## 2015-03-27 DIAGNOSIS — I482 Chronic atrial fibrillation, unspecified: Secondary | ICD-10-CM

## 2015-03-28 NOTE — Progress Notes (Signed)
Remote pacemaker transmission.   

## 2015-03-29 ENCOUNTER — Other Ambulatory Visit: Payer: Self-pay | Admitting: Internal Medicine

## 2015-03-30 LAB — CUP PACEART REMOTE DEVICE CHECK
Battery Voltage: 2.92 V
Brady Statistic RV Percent Paced: 99.12 %
Lead Channel Impedance Value: 384 Ohm
Lead Channel Impedance Value: 504 Ohm
Lead Channel Setting Pacing Amplitude: 2.5 V
Lead Channel Setting Pacing Pulse Width: 0.4 ms
Lead Channel Setting Sensing Sensitivity: 0.9 mV
MDC IDC SESS DTM: 20160731153104
Zone Setting Detection Interval: 350 ms
Zone Setting Detection Interval: 400 ms

## 2015-03-31 ENCOUNTER — Other Ambulatory Visit: Payer: Self-pay | Admitting: Family Medicine

## 2015-03-31 DIAGNOSIS — R739 Hyperglycemia, unspecified: Secondary | ICD-10-CM

## 2015-04-04 DIAGNOSIS — H33051 Total retinal detachment, right eye: Secondary | ICD-10-CM | POA: Diagnosis not present

## 2015-04-04 DIAGNOSIS — H31091 Other chorioretinal scars, right eye: Secondary | ICD-10-CM | POA: Diagnosis not present

## 2015-04-04 DIAGNOSIS — Z961 Presence of intraocular lens: Secondary | ICD-10-CM | POA: Diagnosis not present

## 2015-04-05 ENCOUNTER — Other Ambulatory Visit: Payer: Self-pay | Admitting: Orthopedic Surgery

## 2015-04-05 DIAGNOSIS — M25552 Pain in left hip: Secondary | ICD-10-CM

## 2015-04-06 ENCOUNTER — Ambulatory Visit
Admission: RE | Admit: 2015-04-06 | Discharge: 2015-04-06 | Disposition: A | Discharge status: Home / Self Care | Payer: Commercial Managed Care - PPO | Source: Ambulatory Visit | Attending: Orthopedic Surgery | Admitting: Orthopedic Surgery

## 2015-04-06 ENCOUNTER — Other Ambulatory Visit (INDEPENDENT_AMBULATORY_CARE_PROVIDER_SITE_OTHER): Payer: Commercial Managed Care - PPO

## 2015-04-06 DIAGNOSIS — R739 Hyperglycemia, unspecified: Secondary | ICD-10-CM

## 2015-04-06 DIAGNOSIS — M25552 Pain in left hip: Secondary | ICD-10-CM

## 2015-04-06 LAB — HEMOGLOBIN A1C: Hgb A1c MFr Bld: 5.8 % (ref 4.6–6.5)

## 2015-04-07 ENCOUNTER — Other Ambulatory Visit: Payer: 59

## 2015-04-07 ENCOUNTER — Other Ambulatory Visit: Payer: Self-pay

## 2015-04-10 ENCOUNTER — Ambulatory Visit: Payer: Commercial Managed Care - PPO | Admitting: Family Medicine

## 2015-04-10 ENCOUNTER — Encounter: Payer: Self-pay | Admitting: Cardiology

## 2015-04-11 ENCOUNTER — Encounter: Payer: Self-pay | Admitting: Family Medicine

## 2015-04-11 ENCOUNTER — Ambulatory Visit (INDEPENDENT_AMBULATORY_CARE_PROVIDER_SITE_OTHER): Payer: Commercial Managed Care - PPO | Admitting: Family Medicine

## 2015-04-11 VITALS — BP 124/70 | HR 91 | Temp 98.3°F | Wt 292.5 lb

## 2015-04-11 DIAGNOSIS — I10 Essential (primary) hypertension: Secondary | ICD-10-CM | POA: Diagnosis not present

## 2015-04-11 DIAGNOSIS — R739 Hyperglycemia, unspecified: Secondary | ICD-10-CM

## 2015-04-11 MED ORDER — HYDROCHLOROTHIAZIDE 25 MG PO TABS: 12.5000 mg | ORAL_TABLET | Freq: Every day | ORAL | Status: DC

## 2015-04-11 NOTE — Progress Notes (Signed)
Pre visit review using our clinic review tool, if applicable. No additional management support is needed unless otherwise documented below in the visit note.  Intentional weight loss.  A1c now normal.  D/w pt.  Diet and exercise, doing well with both.  He cut out the "whites", rice/pasta/potatoes.  D/w pt about substitutions.    HTN.  He has been lightheaded some.  Weight loss noted.  Compliant with meds.   He had used an unrecalled med from Dr. Luciana Axe in preop.  He was asking about that.  He'll update me when he gets the name of the med.    Meds, vitals, and allergies reviewed.   ROS: See HPI.  Otherwise, noncontributory.  GEN: nad, alert and oriented HEENT: mucous membranes moist NECK: supple w/o LA CV: rrr.  PULM: ctab, no inc wob ABD: soft, +bs EXT: no edema SKIN: no acute rash

## 2015-04-11 NOTE — Patient Instructions (Addendum)
Cut the HCTZ in half- take 12.5mg  a day for about 1 week. If still lightheaded then stop it totally.   If still with trouble after that, then let me know.   Call Dr. Luciana Axe and see what med you were on.  Let me know.

## 2015-04-12 NOTE — Assessment & Plan Note (Signed)
Now lightheaded with weight loss.  Cut the HCTZ in half- take 12.5mg  a day for about 1 week. If still lightheaded then stop it totally.  If still with troubles after that, then he'll let me know.  He agrees with plan.

## 2015-04-12 NOTE — Assessment & Plan Note (Signed)
A1c now normalized with dec in weight.  D/w pt.  I encouraged him to continue with diet.

## 2015-04-13 ENCOUNTER — Ambulatory Visit: Payer: 59 | Admitting: Family Medicine

## 2015-04-14 ENCOUNTER — Ambulatory Visit: Payer: 59 | Admitting: Family Medicine

## 2015-04-17 ENCOUNTER — Encounter: Payer: Self-pay | Admitting: Internal Medicine

## 2015-05-16 ENCOUNTER — Ambulatory Visit (INDEPENDENT_AMBULATORY_CARE_PROVIDER_SITE_OTHER): Payer: Commercial Managed Care - PPO | Admitting: Nurse Practitioner

## 2015-05-16 ENCOUNTER — Encounter: Payer: Self-pay | Admitting: Nurse Practitioner

## 2015-05-16 ENCOUNTER — Telehealth: Payer: Self-pay | Admitting: *Deleted

## 2015-05-16 VITALS — BP 104/66 | HR 65 | Ht 72.0 in | Wt 288.1 lb

## 2015-05-16 DIAGNOSIS — I2510 Atherosclerotic heart disease of native coronary artery without angina pectoris: Secondary | ICD-10-CM | POA: Diagnosis not present

## 2015-05-16 DIAGNOSIS — I482 Chronic atrial fibrillation, unspecified: Secondary | ICD-10-CM

## 2015-05-16 DIAGNOSIS — I429 Cardiomyopathy, unspecified: Secondary | ICD-10-CM | POA: Diagnosis not present

## 2015-05-16 DIAGNOSIS — I10 Essential (primary) hypertension: Secondary | ICD-10-CM

## 2015-05-16 DIAGNOSIS — I428 Other cardiomyopathies: Secondary | ICD-10-CM

## 2015-05-16 MED ORDER — BENAZEPRIL HCL 40 MG PO TABS: 20.0000 mg | ORAL_TABLET | Freq: Two times a day (BID) | ORAL | Status: DC

## 2015-05-16 NOTE — Patient Instructions (Addendum)
We will be checking the following labs today - NONE   Medication Instructions:    Continue with your current medicines. But  I am cutting your Benazepril in half - take just 1/2 tablet twice a day    Testing/Procedures To Be Arranged:  N/A  Follow-Up:   See me in 6 months.  Needs device check scheduled.     Other Special Instructions:   Keep a check on your BP - send me a message or call if continues to run low.   Call the [facility] office at [phone] if you have any questions, problems or concerns.

## 2015-05-16 NOTE — Telephone Encounter (Signed)
Surgical Clearance to [facility] faxed over to [phone]

## 2015-05-16 NOTE — Progress Notes (Signed)
CARDIOLOGY OFFICE NOTE  Date:  05/16/2015    [name] [name] Date of Birth: 01/24/1941 Medical Record #[id number]  PCP:  Crawford Givens, MD  Cardiologist:  Allred    Chief Complaint  Patient presents with  . Coronary Artery Disease    6 month check - seen for Dr. Johney Frame    History of Present Illness: [name] [name] is a 74 y.o. male who presents today for a 6 month check. Seen for Dr. Johney Frame. Former patient of Dr. Ronnald Nian. He has multiple issues which include CAD with remote PCI in 2003, chronic atrial fib, HTN, ETOH abuse, OSA on CPAP, obesity, HLD, GERD, SSS with PPM, past atrial flutter ablation, gout, and a hiatal hernia. Other issues as noted below.   Seen in September of 2014 by Dr. Johney Frame - complaining of exertional DOE - Lexiscan ordered - inferior scar with possible peri-infarct ischemia which was new compared to prior Myoview per Dr. Deborah Chalk - was recathed - continued with medical management.   Switched to Xarelto in October of 2014. Coreg increased for BP. Encouraged to lose weight which has always been a chronic issue. [name] [name] continued to opt for rate control in regards to his AF.  I last saw him back in March - weight was up - drinking more beer - having at least 2 to 3 per day - we looked up the calories - about 100 calories per can - so about every 2 weeks he was gaining a pound.  Comes back today. Here alone. BP running low. He has lost weight. Dr. Para March has cut his HCTZ in half. He is dizzy and lightheaded. BP still running low. No chest pain. Breathing is good. He has stopped most of his alcohol use - says he got really scared about his blood sugars - A1C back down.    Past Medical History  Diagnosis Date  . CAD (coronary artery disease)     s/p PCI 2003. Last stress test in Feb 2011 was normal  . Persistent atrial fibrillation     on Multaq and Pradaxa  . HTN (hypertension)   . ETOH abuse   . DJD (degenerative joint disease)     s/p Left TKA 06/2008  .  Metabolic syndrome   . Hematuria   . Psychosexual dysfunction with inhibited sexual excitement   . Hypertrophy of prostate without urinary obstruction and other lower urinary tract symptoms (LUTS)   . Anxiety state, unspecified   . Allergic rhinitis, cause unspecified   . OSA on CPAP     compliant with CPAP  . Obesity   . Hyperlipidemia   . GERD (gastroesophageal reflux disease)   . Sick sinus syndrome     s/p PPM by Dr Deborah Chalk  . Atrial flutter     s/p atrial flutter CTI ablation 10/26/10  . Hiatal hernia   . Gout   . Chronic kidney disease     Kidney Stones    Past Surgical History  Procedure Laterality Date  . Esophagogastroduodenoscopy  1992    Dr. Doreatha Martin  . Hiatal hernia repair  04/1998  . Balloon angioplasty, artery  2003    Ostium of the 2nd DX  . Cholecystectomy  1999    Dr. Luan Pulling  . Basal cell carcinoma excision  12/2003    On back  . Total knee arthroplasty  06/2008    Dr. Despina Hick  . Pacemaker insertion  10/2009    Medtronic  . Atrial flutter ablation  10/26/10  .  Appendectomy    . Cystoscopy      stone removal  . Pars plana vitrectomy  01/01/2012    Procedure: PARS PLANA VITRECTOMY WITH 20 GAUGE;  Surgeon: Edmon Crape, MD;  Location: [facility] OR;  Service: Ophthalmology;  Laterality: Right;  VITRECTOMY 25G, REMOVE SILICONE OIL, INJECTION OF SILICONE OIL 5000 TO RIGHT EYE, MEMBRANE PEEL, REPAIR COMPLEX RETINAL DETACHMENT, REMOVAL NON MAGNETIC FOREIGN BODY.  . Silicon oil removal  01/01/2012    Procedure: SILICON OIL REMOVAL;  Surgeon: Edmon Crape, MD;  Location: [facility] OR;  Service: Ophthalmology;  Laterality: Right;  INSERT SILICONE OIL  . Left heart catheterization with coronary angiogram N/A 06/25/2013    Procedure: LEFT HEART CATHETERIZATION WITH CORONARY ANGIOGRAM;  Surgeon: Micheline Chapman, MD;  Location: [facility] C[facility] LAB;  Service: Cardiovascular;  Laterality: N/A;  . Hernia repair       Medications: Current Outpatient Prescriptions  Medication Sig Dispense Refill    . allopurinol (ZYLOPRIM) 300 MG tablet TAKE 1 TABLET BY MOUTH EVERY DAY 30 tablet 11  . amLODipine (NORVASC) 10 MG tablet Take 1 tablet (10 mg total) by mouth daily. 90 tablet 3  . amoxicillin (AMOXIL) 500 MG tablet 500 mg. Prior to dental work    . benazepril (LOTENSIN) 40 MG tablet Take 0.5 tablets (20 mg total) by mouth 2 (two) times daily. 180 tablet 0  . carvedilol (COREG) 6.25 MG tablet TAKE 1 TABLET BY MOUTH TWICE DAILY 180 tablet 0  . colchicine 0.6 MG tablet Take 1 tablet (0.6 mg total) by mouth daily as needed (gout). 30 tablet 2  . doxazosin (CARDURA) 8 MG tablet Once daily  10  . famciclovir (FAMVIR) 500 MG tablet Take 3 tablets by mouth  daily as needed 21 tablet 12  . finasteride (PROSCAR) 5 MG tablet Take 5 mg by mouth daily.      Marland Kitchen FLUoxetine (PROZAC) 20 MG capsule TAKE ONE CAPSULE BY MOUTH EVERY DAY 90 capsule 2  . hydrochlorothiazide (HYDRODIURIL) 25 MG tablet Take 0.5 tablets (12.5 mg total) by mouth daily.    . Multiple Vitamin (MULITIVITAMIN WITH MINERALS) TABS Take 1 tablet by mouth daily.    Marland Kitchen oxybutynin (DITROPAN XL) 15 MG 24 hr tablet Take 15 mg by mouth at bedtime.    Marland Kitchen oxyCODONE-acetaminophen (PERCOCET/ROXICET) 5-325 MG per tablet Take 1-2 tablets by mouth every 6 (six) hours as needed (pain). 30 tablet 0  . XARELTO 20 MG TABS tablet TAKE 1 TABLET BY MOUTH DAILY WITH SUPPER 30 tablet 5   No current facility-administered medications for this visit.    Allergies: Allergies  Allergen Reactions  . Morphine Sulfate Other (See Comments)    Feels like he's back in [place]    Social History: The patient  reports that he quit smoking about 24 years ago. His smoking use included Cigarettes. He has a 30 pack-year smoking history. He has never used smokeless tobacco. He reports that he drinks about 3.6 oz of alcohol per week. He reports that he does not use illicit drugs.   Family History: The patient's family history includes Aneurysm in his sister; Breast cancer in  his mother; Dementia in his mother; Heart attack in his father; Hypertension in his father. There is no history of Colon cancer, Anesthesia problems, or Prostate cancer.   Review of Systems: Please see the history of present illness.   Otherwise, the review of systems is positive for none.   All other systems are reviewed and negative.   Physical  Exam: VS:  BP 104/66 mmHg  Pulse 65  Ht 6' (1.829 m)  Wt 288 lb 1.9 oz (130.69 kg)  BMI 39.07 kg/m2 .  BMI Body mass index is 39.07 kg/(m^2).  Wt Readings from Last 3 Encounters:  05/16/15 288 lb 1.9 oz (130.69 kg)  04/11/15 292 lb 8 oz (132.677 kg)  01/16/15 298 lb 3.2 oz (135.263 kg)    General: Pleasant. Well developed, well nourished and in no acute distress. He remains obese. Weight down from 307 at my last visit.  HEENT: Normal. Neck: Supple, no JVD, carotid bruits, or masses noted.  Cardiac: Irregular irregular rhythm. Rate is ok. No murmurs, rubs, or gallops. No edema.  Respiratory:  Lungs are clear to auscultation bilaterally with normal work of breathing.  GI: Soft and nontender.  MS: No deformity or atrophy. Gait and ROM intact. Skin: Warm and dry. Color is normal.  Neuro:  Strength and sensation are intact and no gross focal deficits noted.  Psych: Alert, appropriate and with normal affect.   LABORATORY DATA:  EKG:  EKG is ordered today. This shows a paced rhythm with underlying AF.   Lab Results  Component Value Date   WBC 8.8 06/15/2014   HGB 15.6 06/15/2014   HCT 46.2 06/15/2014   PLT 225.0 06/15/2014   GLUCOSE 131* 07/29/2014   CHOL 140 07/29/2014   TRIG 164.0* 07/29/2014   HDL 32.30* 07/29/2014   LDLDIRECT 51.8 11/28/2010   LDLCALC 75 07/29/2014   ALT 30 07/29/2014   AST 34 07/29/2014   NA 145 07/29/2014   K 4.5 07/29/2014   CL 104 07/29/2014   CREATININE 1.0 07/29/2014   BUN 16 07/29/2014   CO2 28 07/29/2014   TSH 1.38 07/29/2014   PSA 0.30 05/27/2012   INR 1.15 06/25/2013   HGBA1C 5.8 04/06/2015     BNP (last 3 results) No results for input(s): BNP in the last 8760 hours.  ProBNP (last 3 results) No results for input(s): PROBNP in the last 8760 hours.   Other Studies Reviewed Today:  Coronary angiography from October 2014:  Coronary dominance: right  Left mainstem: The left mainstem arises from the left cusp. There is mild calcification. There is diffuse irregularity but no significant stenosis present.  Left anterior descending (LAD): The LAD is mildly calcified. There are diffuse luminal irregularities without significant stenoses. The vessel reaches the left ventricular apex. The first and second diagonal branches both have ostial stenoses. The first diagonal has 70% stenosis. The second diagonal has 80% ostial stenosis. Both vessels are small to medium in caliber.  Left circumflex (LCx): There is a large ramus intermedius branch. There is no significant stenosis of this branch noted. The AV groove circumflex is small without significant stenosis.  Right coronary artery (RCA): The RCA is dominant. The proximal vessel has 20% stenosis. The mid vessel has 30% stenosis. The PDA and PLA branches are patent.  Left ventriculography: Left ventricular systolic function is low-normal, LVEF is estimated at 50%, there is no significant mitral regurgitation   Final Conclusions:  1. Mild nonobstructive coronary artery disease as detailed above  2. Low normal left ventricular systolic function   Recommendations: Medical therapy. The patient has stenosis of the diagonal branches of his LAD. These branches are fairly small and do not correlate with this perfusion defect on the Myoview scan. I do not think they're contributing to his shortness of breath. Medical therapy is recommended.   Tonny Bollman  06/25/2013, 9:54 AM  Echo Study Conclusions from 05/2013 - Left ventricle: The cavity size was normal. Wall thickness was increased in a pattern of moderate LVH.  Systolic function was mildly reduced. The estimated ejection fraction was in the range of 45% to 50%. Wall motion was normal; there were no regional wall motion abnormalities. - Left atrium: The atrium was moderately dilated. - Right atrium: The atrium was mildly dilated.  Assessment / Plan: 1. CAD - no active symptoms - to continue with medical management.   2. Dyspnea - basically resolved.   3. Chronic atrial fib - managed with rate control and anticoagulation - no problems noted.   4. HTN - BP running low - he is symptomatic - would cut his ACE in half. I have asked him to continue to monitor - may need further reduction if he continues to be successful with weight loss.   5. OSA - on CPAP   6. Underlying PPM - followed by Dr. Johney Frame - needs his device check scheduled.   7. Borderline DM - recent A1C looks better  8. Low normal EF - asymptomatic  9. Knee surgery - should be an acceptable candidate for his knee surgery from our standpoint.   Current medicines are reviewed with the patient today.  The patient does not have concerns regarding medicines other than what has been noted above.  The following changes have been made:  See above.  Labs/ tests ordered today include:    Orders Placed This Encounter  Procedures  . EKG 12-Lead     Disposition:   FU with me in 6 months.   Patient is agreeable to this plan and will call if any problems develop in the interim.   Signed: Rosalio Macadamia, RN, ANP-C 05/16/2015 8:50 AM  [facility] [address] [place], [place]  [address] Phone: [phone] Fax: [phone]

## 2015-05-26 DIAGNOSIS — G4733 Obstructive sleep apnea (adult) (pediatric): Secondary | ICD-10-CM | POA: Diagnosis not present

## 2015-05-30 ENCOUNTER — Other Ambulatory Visit: Payer: Self-pay | Admitting: Internal Medicine

## 2015-05-30 NOTE — Telephone Encounter (Signed)
Ok to refill long term or should this be deferred to pcp?

## 2015-05-30 NOTE — Telephone Encounter (Signed)
pcp to fill

## 2015-06-19 ENCOUNTER — Encounter: Payer: Self-pay | Admitting: Internal Medicine

## 2015-06-28 ENCOUNTER — Encounter: Payer: Self-pay | Admitting: Internal Medicine

## 2015-06-28 ENCOUNTER — Other Ambulatory Visit: Payer: Self-pay | Admitting: Internal Medicine

## 2015-06-28 ENCOUNTER — Ambulatory Visit (INDEPENDENT_AMBULATORY_CARE_PROVIDER_SITE_OTHER): Payer: Commercial Managed Care - PPO | Admitting: Internal Medicine

## 2015-06-28 ENCOUNTER — Other Ambulatory Visit: Payer: Self-pay

## 2015-06-28 ENCOUNTER — Other Ambulatory Visit: Payer: Self-pay | Admitting: Family Medicine

## 2015-06-28 VITALS — BP 130/88 | HR 72 | Ht 72.0 in | Wt 290.4 lb

## 2015-06-28 DIAGNOSIS — G4733 Obstructive sleep apnea (adult) (pediatric): Secondary | ICD-10-CM | POA: Diagnosis not present

## 2015-06-28 DIAGNOSIS — I482 Chronic atrial fibrillation, unspecified: Secondary | ICD-10-CM

## 2015-06-28 DIAGNOSIS — I429 Cardiomyopathy, unspecified: Secondary | ICD-10-CM | POA: Diagnosis not present

## 2015-06-28 DIAGNOSIS — I10 Essential (primary) hypertension: Secondary | ICD-10-CM

## 2015-06-28 DIAGNOSIS — I428 Other cardiomyopathies: Secondary | ICD-10-CM

## 2015-06-28 LAB — CUP PACEART INCLINIC DEVICE CHECK
Battery Voltage: 2.9 V
Brady Statistic RV Percent Paced: 98.33 %
Date Time Interrogation Session: 20161102150311
Implantable Lead Implant Date: 20110304
Implantable Lead Implant Date: 20110304
Implantable Lead Location: 753859
Implantable Lead Location: 753860
Implantable Lead Model: 5076
Implantable Lead Model: 5076
Lead Channel Impedance Value: 368 Ohm
Lead Channel Impedance Value: 496 Ohm
Lead Channel Pacing Threshold Amplitude: 1 V
Lead Channel Pacing Threshold Pulse Width: 0.4 ms
Lead Channel Sensing Intrinsic Amplitude: 0.565 mV
Lead Channel Sensing Intrinsic Amplitude: 5.519 mV
Lead Channel Setting Pacing Amplitude: 2.5 V
Lead Channel Setting Pacing Pulse Width: 0.4 ms
Lead Channel Setting Sensing Sensitivity: 0.9 mV

## 2015-06-28 NOTE — Telephone Encounter (Signed)
Needs to obtain from PCP.  

## 2015-06-28 NOTE — Telephone Encounter (Signed)
Ok to refill 

## 2015-06-28 NOTE — Patient Instructions (Signed)
Medication Instructions:  Your physician recommends that you continue on your current medications as directed. Please refer to the Current Medication list given to you today.      Labwork: None ordered   Testing/Procedures: None ordered   Follow-Up: Your physician wants you to follow-up in: 12 months with Dr Johney Frame Bonita Quin will receive a reminder letter in the mail two months in advance. If you don't receive a letter, please call our office to schedule the follow-up appointment.    Remote monitoring is used to monitor your Pacemaker from home. This monitoring reduces the number of office visits required to check your device to one time per year. It allows [place] to keep an eye on the functioning of your device to ensure it is working properly. You are scheduled for a device check from home on 09/27/15. You may send your transmission at any time that day. If you have a wireless device, the transmission will be sent automatically. After your physician reviews your transmission, you will receive a postcard with your next transmission date.      Any Other Special Instructions Will Be Listed Below (If Applicable).     If you need a refill on your cardiac medications before your next appointment, please call your pharmacy.

## 2015-06-28 NOTE — Progress Notes (Signed)
PCP: Crawford Givens, MD  The patient presents today for routine electrophysiology followup.  Since last being seen in our clinic, the patient reports doing reasonably well.   He remains quite active. His SOB is stable.  He has not been able to lose weight.  He uses CPAP for sleep apnea.  His primary concern is with microscopic hematuria and concerns for bleeding.  He also has DJD for which he requires frequent NSAIDS.  He is planning to have knee surgery in December.  Today, he denies symptoms of palpitations, chest pain, orthopnea, PND, lower extremity edema, dizziness, presyncope, syncope, or neurologic sequela. The patient feels that he is tolerating medications without difficulties and is otherwise without complaint today.   Past Medical History  Diagnosis Date  . CAD (coronary artery disease)     s/p PCI 2003. Last stress test in Feb 2011 was normal  . Persistent atrial fibrillation (HCC)     on Multaq and Pradaxa  . HTN (hypertension)   . ETOH abuse   . DJD (degenerative joint disease)     s/p Left TKA 06/2008  . Metabolic syndrome   . Hematuria   . Psychosexual dysfunction with inhibited sexual excitement   . Hypertrophy of prostate without urinary obstruction and other lower urinary tract symptoms (LUTS)   . Anxiety state, unspecified   . Allergic rhinitis, cause unspecified   . OSA on CPAP     compliant with CPAP  . Obesity   . Hyperlipidemia   . GERD (gastroesophageal reflux disease)   . Sick sinus syndrome [facility])     s/p PPM by Dr Deborah Chalk  . Atrial flutter (HCC)     s/p atrial flutter CTI ablation 10/26/10  . Hiatal hernia   . Gout   . Chronic kidney disease     Kidney Stones   Past Surgical History  Procedure Laterality Date  . Esophagogastroduodenoscopy  1992    Dr. Doreatha Martin  . Hiatal hernia repair  04/1998  . Balloon angioplasty, artery  2003    Ostium of the 2nd DX  . Cholecystectomy  1999    Dr. Luan Pulling  . Basal cell carcinoma excision  12/2003    On back  .  Total knee arthroplasty  06/2008    Dr. Despina Hick  . Pacemaker insertion  10/2009    [employer]tr[employer]  . Atrial flutter ablation  10/26/10  . Appendectomy    . Cystoscopy      stone removal  . Pars plana vitrectomy  01/01/2012    Procedure: PARS PLANA VITRECTOMY WITH 20 GAUGE;  Surgeon: Edmon Crape, MD;  Location: [facility] OR;  Service: Ophthalmology;  Laterality: Right;  VITRECTOMY 25G, REMOVE SILICONE OIL, INJECTION OF SILICONE OIL 5000 TO RIGHT EYE, MEMBRANE PEEL, REPAIR COMPLEX RETINAL DETACHMENT, REMOVAL NON MAGNETIC FOREIGN BODY.  . Silicon oil removal  01/01/2012    Procedure: SILICON OIL REMOVAL;  Surgeon: Edmon Crape, MD;  Location: [facility] OR;  Service: Ophthalmology;  Laterality: Right;  INSERT SILICONE OIL  . Left heart catheterization with coronary angiogram N/A 06/25/2013    Procedure: LEFT HEART CATHETERIZATION WITH CORONARY ANGIOGRAM;  Surgeon: Micheline Chapman, MD;  Location: [facility] CATH LAB;  Service: Cardiovascular;  Laterality: N/A;  . Hernia repair      Current Outpatient Prescriptions  Medication Sig Dispense Refill  . allopurinol (ZYLOPRIM) 300 MG tablet TAKE 1 TABLET BY MOUTH EVERY DAY 30 tablet 11  . amLODipine (NORVASC) 10 MG tablet Take 1 tablet (10 mg total) by mouth  daily. 90 tablet 3  . amoxicillin (AMOXIL) 500 MG tablet Take by mouth as directed. Prior to dental work    . benazepril (LOTENSIN) 40 MG tablet Take 0.5 tablets (20 mg total) by mouth 2 (two) times daily. 180 tablet 0  . carvedilol (COREG) 6.25 MG tablet TAKE 1 TABLET BY MOUTH TWICE DAILY 180 tablet 0  . colchicine 0.6 MG tablet Take 1 tablet (0.6 mg total) by mouth daily as needed (gout). 30 tablet 2  . doxazosin (CARDURA) 8 MG tablet Take 8 mg by mouth daily.   10  . famciclovir (FAMVIR) 500 MG tablet Take 1,500 mg by mouth daily as needed. As directed    . finasteride (PROSCAR) 5 MG tablet Take 5 mg by mouth daily.      Marland Kitchen FLUoxetine (PROZAC) 20 MG capsule TAKE ONE CAPSULE BY MOUTH EVERY DAY 90 capsule 2  .  hydrochlorothiazide (HYDRODIURIL) 25 MG tablet Take 0.5 tablets (12.5 mg total) by mouth daily.    . Multiple Vitamin (MULITIVITAMIN WITH MINERALS) TABS Take 1 tablet by mouth daily.    Marland Kitchen oxybutynin (DITROPAN XL) 15 MG 24 hr tablet Take 15 mg by mouth at bedtime.    Marland Kitchen oxyCODONE-acetaminophen (PERCOCET/ROXICET) 5-325 MG per tablet Take 1-2 tablets by mouth every 6 (six) hours as needed (pain). 30 tablet 0  . XARELTO 20 MG TABS tablet TAKE 1 TABLET BY MOUTH DAILY WITH SUPPER 30 tablet 5   No current facility-administered medications for this visit.    Allergies  Allergen Reactions  . Morphine Sulfate Other (See Comments)    Feels like he's back in [place]    Social History   Social History  . Marital Status: Married    Spouse Name: N/A  . Number of Children: 2  . Years of Education: N/A   Occupational History  . TRUCK DRIVER Counselling psychologist   Social History Main Topics  . Smoking status: Former Smoker -- 1.00 packs/day for 30 years    Types: Cigarettes    Quit date: 08/26/1990  . Smokeless tobacco: Never Used  . Alcohol Use: 3.6 oz/week    6 Cans of beer per week     Comment: moderate ETOH use, only drinks on weekends  . Drug Use: No  . Sexual Activity: Not on file   Other Topics Concern  . Not on file   Social History Narrative   1 or less caffeine drinks daily   [employer] [phone], [employer], [place]   ROS- all systems are reviewed and negative except as per HPI  Family History  Problem Relation Age of Onset  . Dementia Mother   . Breast cancer Mother     metastatic   . Heart attack Father   . Hypertension Father   . Aneurysm Sister   . Colon cancer Neg Hx   . Anesthesia problems Neg Hx   . Prostate cancer Neg Hx    Physical Exam: Filed Vitals:   06/28/15 1218  BP: 130/88  Pulse: 72  Height: 6' (1.829 m)  Weight: 290 lb 6.4 oz (131.725 kg)    GEN- The patient is overweight appearing, alert and oriented x 3 today.   Head- normocephalic,  atraumatic Eyes-  Sclera clear, conjunctiva pink Ears- hearing intact Oropharynx- clear Neck- supple  Lungs- Clear to ausculation bilaterally, normal work of breathing Chest- pacemaker pocket is well healed Heart- RRR (paced) GI- soft, NT, ND, + BS Extremities- no clubbing, cyanosis, or edema MS- no significant deformity or atrophy Skin-  no rash or lesion Psych- euthymic mood, full affect Neuro- strength and sensation are intact  Pacemaker interrogation- reviewed in detail today,  See PACEART report  Assessment and Plan:  1. Permanent afib Rate controlled chads2vasc score is at least 3.  He is anticoagualted but concerns about bleeding risks long term. Today, we discussed left atrial appendage occlusion (WATCHMAN) as an alternative to anticoagulation.  He is very interested in this. For now, he will proceed with knee surgery in December and hopefully wean off of NSAIDS.  IF he has any further difficulty with hematuria or bleeding concerns, he will likely want to proceed with watchman. He would like to be placed on our watchman list and be called after the first of the year for additional consideration.  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 HTN Stable No change required today  4. CAD Medical therapy as above  5. symptomatic bradycardia Normal pacemaker function See Pace Art report No changes today  6. OSA Compliance with CPAP is encouraged  7. Obesity Body mass index is 39.38 kg/(m^2). Weight loss is encouraged  Follow-up with Lawson Fiscal in 6 months [facility] Return to see me in 1 year (unless needed sooner)

## 2015-06-28 NOTE — Telephone Encounter (Signed)
Pt left v/m requesting refill allopurinol. Pt has been getting from Dr Johney Frame and was advised to get from PCP. Need to know what pharmacy. Unable to reach pt by phone.

## 2015-06-30 ENCOUNTER — Other Ambulatory Visit: Payer: Self-pay | Admitting: Family Medicine

## 2015-06-30 NOTE — Telephone Encounter (Signed)
No longer on medication list.  Looks like it was d/c'ed at 04/11/15 f/u.  Okay to refill?

## 2015-07-02 NOTE — Telephone Encounter (Signed)
Please clarify with patient.  I thought he was not needing med currently.  Thanks.

## 2015-07-03 ENCOUNTER — Other Ambulatory Visit: Payer: Self-pay | Admitting: Internal Medicine

## 2015-07-03 ENCOUNTER — Other Ambulatory Visit: Payer: Self-pay | Admitting: *Deleted

## 2015-07-03 NOTE — Telephone Encounter (Signed)
Pt notified rx was sent to pharmacy; pt voiced understanding.

## 2015-07-03 NOTE — Telephone Encounter (Signed)
Faxed refill request.  Medication was not on current meds list, added for refill request.  Last Filled:   04/13/2014  Please advise. Last office visit:   04/11/15

## 2015-07-04 MED ORDER — ZOLPIDEM TARTRATE 10 MG PO TABS: 10.0000 mg | ORAL_TABLET | Freq: Every evening | ORAL | Status: DC | PRN

## 2015-07-04 NOTE — Telephone Encounter (Signed)
Rx called in as directed.   

## 2015-07-04 NOTE — Telephone Encounter (Signed)
Please call in.  Thanks.   

## 2015-07-05 ENCOUNTER — Telehealth: Payer: Self-pay | Admitting: Family Medicine

## 2015-07-05 NOTE — Telephone Encounter (Signed)
Pt is calling regarding ppw that is needed for surgery.  Please call pt at [phone], when ppw is completed and sent back to Dr Antony Odea. Pt is having surgery in December and was told by Dr Lucio's office that the ppw has not been sent back in to the office  Thanks

## 2015-07-06 NOTE — Telephone Encounter (Signed)
I don't have any forms to fax back.  Everything that has come in re: this patient has been done from my standpoint.  He'll need clearance from cards preop, given his history.   If they need anything else, then please have them contact cards.  Thanks.

## 2015-07-06 NOTE — Telephone Encounter (Signed)
Left detailed message on voicemail.  

## 2015-07-10 ENCOUNTER — Other Ambulatory Visit: Payer: Self-pay | Admitting: Internal Medicine

## 2015-07-13 ENCOUNTER — Telehealth: Payer: Self-pay | Admitting: Internal Medicine

## 2015-07-13 NOTE — Telephone Encounter (Signed)
New message     Request for surgical clearance:  What type of surgery is being performed? Total knee When is this surgery scheduled? 08-02-15 Are there any medications that need to be held prior to surgery and how long? Stop xarelto 5 days prior to surgery?  If not, when can pt stop xarelto? 1. Name of physician performing surgery? Dr Despina Hick  2. What is your office phone and fax number?  Fax [phone]

## 2015-07-15 ENCOUNTER — Ambulatory Visit: Payer: Self-pay | Admitting: Orthopedic Surgery

## 2015-07-15 NOTE — Progress Notes (Signed)
Preoperative surgical orders have been place into the Epic hospital system for [name] [name] on 07/15/2015, 3:59 PM  by Patrica Duel for surgery on 07-31-2015.  Preop Total Knee orders including Experal, IV Tylenol, and IV Decadron as long as there are no contraindications to the above medications. Avel Peace, PA-C

## 2015-07-18 ENCOUNTER — Other Ambulatory Visit (HOSPITAL_COMMUNITY): Payer: Self-pay | Admitting: *Deleted

## 2015-07-18 NOTE — Patient Instructions (Addendum)
[name] [name]  07/18/2015  Your procedure is scheduled on: 07-31-15  Report to [facility] Main  Entrance take [facility]  elevators to 3rd floor to  Short Stay Center at 625 AM.  Call this number if you have problems the morning of surgery [phone]   Remember: ONLY 1 PERSON MAY GO WITH YOU TO SHORT STAY TO GET  READY MORNING OF YOUR SURGERY.  Do not eat food or drink liquids :After Midnight.    bring cpap mask and tubing  Take these medicines the morning of surgery with A SIP OF WATER: Allopurinol, Amlodipine (Norvasc), Doxazosin (Cardura), Finasteride (Proscar),   eye drop if needed  DO NOT TAKE ANY DIABETIC MEDICATIONS DAY OF YOUR SURGERY                               You may not have any metal on your body including hair pins and              piercings  Do not wear jewelry, make-up, lotions, powders or perfumes, deodorant             Do not wear nail polish.  Do not shave  48 hours prior to surgery.              Men may shave face and neck.   Do not bring valuables to the hospital. [place] IS NOT             RESPONSIBLE   FOR VALUABLES.  Contacts, dentures or bridgework may not be worn into surgery.  Leave suitcase in the car. After surgery it may be brought to your room.     Patients discharged the day of surgery will not be allowed to drive home.  Name and phone number of your driver:  Special Instructions: N/A              Please read over the following fact sheets you were given: _____________________________________________________________________             [facility] - Preparing for Surgery Before surgery, you can play an important role.  Because skin is not sterile, your skin needs to be as free of germs as possible.  You can reduce the number of germs on your skin by washing with CHG (chlorahexidine gluconate) soap before surgery.  CHG is an antiseptic cleaner which kills germs and bonds with the skin to continue killing germs even after  washing. Please DO NOT use if you have an allergy to CHG or antibacterial soaps.  If your skin becomes reddened/irritated stop using the CHG and inform your nurse when you arrive at Short Stay. Do not shave (including legs and underarms) for at least 48 hours prior to the first CHG shower.  You may shave your face/neck. Please follow these instructions carefully:  1.  Shower with CHG Soap the night before surgery and the  morning of Surgery.  2.  If you choose to wash your hair, wash your hair first as usual with your  normal  shampoo.  3.  After you shampoo, rinse your hair and body thoroughly to remove the  shampoo.                           4.  Use CHG as you would any  other liquid soap.  You can apply chg directly  to the skin and wash                       Gently with a scrungie or clean washcloth.  5.  Apply the CHG Soap to your body ONLY FROM THE NECK DOWN.   Do not use on face/ open                           Wound or open sores. Avoid contact with eyes, ears mouth and genitals (private parts).                       Wash face,  Genitals (private parts) with your normal soap.             6.  Wash thoroughly, paying special attention to the area where your surgery  will be performed.  7.  Thoroughly rinse your body with warm water from the neck down.  8.  DO NOT shower/wash with your normal soap after using and rinsing off  the CHG Soap.                9.  Pat yourself dry with a clean towel.            10.  Wear clean pajamas.            11.  Place clean sheets on your bed the night of your first shower and do not  sleep with pets. Day of Surgery : Do not apply any lotions/deodorants the morning of surgery.  Please wear clean clothes to the hospital/surgery center.  FAILURE TO FOLLOW THESE INSTRUCTIONS MAY RESULT IN THE CANCELLATION OF YOUR SURGERY PATIENT SIGNATURE_________________________________  NURSE  SIGNATURE__________________________________  ________________________________________________________________________   Adam Phenix  An incentive spirometer is a tool that can help keep your lungs clear and active. This tool measures how well you are filling your lungs with each breath. Taking long deep breaths may help reverse or decrease the chance of developing breathing (pulmonary) problems (especially infection) following:  A long period of time when you are unable to move or be active. BEFORE THE PROCEDURE   If the spirometer includes an indicator to show your best effort, your nurse or respiratory therapist will set it to a desired goal.  If possible, sit up straight or lean slightly forward. Try not to slouch.  Hold the incentive spirometer in an upright position. INSTRUCTIONS FOR USE   Sit on the edge of your bed if possible, or sit up as far as you can in bed or on a chair.  Hold the incentive spirometer in an upright position.  Breathe out normally.  Place the mouthpiece in your mouth and seal your lips tightly around it.  Breathe in slowly and as deeply as possible, raising the piston or the ball toward the top of the column.  Hold your breath for 3-5 seconds or for as long as possible. Allow the piston or ball to fall to the bottom of the column.  Remove the mouthpiece from your mouth and breathe out normally.  Rest for a few seconds and repeat Steps 1 through 7 at least 10 times every 1-2 hours when you are awake. Take your time and take a few normal breaths between deep breaths.  The spirometer may include an indicator to show your best effort. Use the indicator as a  goal to work toward during each repetition.  After each set of 10 deep breaths, practice coughing to be sure your lungs are clear. If you have an incision (the cut made at the time of surgery), support your incision when coughing by placing a pillow or rolled up towels firmly against it. Once  you are able to get out of bed, walk around indoors and cough well. You may stop using the incentive spirometer when instructed by your caregiver.  RISKS AND COMPLICATIONS  Take your time so you do not get dizzy or light-headed.  If you are in pain, you may need to take or ask for pain medication before doing incentive spirometry. It is harder to take a deep breath if you are having pain. AFTER USE  Rest and breathe slowly and easily.  It can be helpful to keep track of a log of your progress. Your caregiver can provide you with a simple table to help with this. If you are using the spirometer at home, follow these instructions: [place] IF:   You are having difficultly using the spirometer.  You have trouble using the spirometer as often as instructed.  Your pain medication is not giving enough relief while using the spirometer.  You develop fever of 100.5 F (38.1 C) or higher. SEEK IMMEDIATE MEDICAL CARE IF:   You cough up bloody sputum that had not been present before.  You develop fever of 102 F (38.9 C) or greater.  You develop worsening pain at or near the incision site. MAKE SURE YOU:   Understand these instructions.  Will watch your condition.  Will get help right away if you are not doing well or get worse. Document Released: 12/23/2006 Document Revised: 11/04/2011 Document Reviewed: 02/23/2007 ExitCare Patient Information 2014 Exit[employer], [place].   ________________________________________________________________________  WHAT IS A BLOOD TRANSFUSION? Blood Transfusion Information  A transfusion is the replacement of blood or some of its parts. Blood is made up of multiple cells which provide different functions.  Red blood cells carry oxygen and are used for blood loss replacement.  White blood cells fight against infection.  Platelets control bleeding.  Plasma helps clot blood.  Other blood products are available for specialized needs, such as  hemophilia or other clotting disorders. BEFORE THE TRANSFUSION  Who gives blood for transfusions?   Healthy volunteers who are fully evaluated to make sure their blood is safe. This is blood bank blood. Transfusion therapy is the safest it has ever been in the practice of medicine. Before blood is taken from a donor, a complete history is taken to make sure that person has no history of diseases nor engages in risky social behavior (examples are intravenous drug use or sexual activity with multiple partners). The donor's travel history is screened to minimize risk of transmitting infections, such as malaria. The donated blood is tested for signs of infectious diseases, such as HIV and hepatitis. The blood is then tested to be sure it is compatible with you in order to minimize the chance of a transfusion reaction. If you or a relative donates blood, this is often done in anticipation of surgery and is not appropriate for emergency situations. It takes many days to process the donated blood. RISKS AND COMPLICATIONS Although transfusion therapy is very safe and saves many lives, the main dangers of transfusion include:   Getting an infectious disease.  Developing a transfusion reaction. This is an allergic reaction to something in the blood you were given. Every  precaution is taken to prevent this. The decision to have a blood transfusion has been considered carefully by your caregiver before blood is given. Blood is not given unless the benefits outweigh the risks. AFTER THE TRANSFUSION  Right after receiving a blood transfusion, you will usually feel much better and more energetic. This is especially true if your red blood cells have gotten low (anemic). The transfusion raises the level of the red blood cells which carry oxygen, and this usually causes an energy increase.  The nurse administering the transfusion will monitor you carefully for complications. HOME CARE INSTRUCTIONS  No special  instructions are needed after a transfusion. You may find your energy is better. Speak with your caregiver about any limitations on activity for underlying diseases you may have. SEEK MEDICAL CARE IF:   Your condition is not improving after your transfusion.  You develop redness or irritation at the intravenous (IV) site. SEEK IMMEDIATE MEDICAL CARE IF:  Any of the following symptoms occur over the next 12 hours:  Shaking chills.  You have a temperature by mouth above 102 F (38.9 C), not controlled by medicine.  Chest, back, or muscle pain.  People around you feel you are not acting correctly or are confused.  Shortness of breath or difficulty breathing.  Dizziness and fainting.  You get a rash or develop hives.  You have a decrease in urine output.  Your urine turns a dark color or changes to pink, red, or brown. Any of the following symptoms occur over the next 10 days:  You have a temperature by mouth above 102 F (38.9 C), not controlled by medicine.  Shortness of breath.  Weakness after normal activity.  The white part of the eye turns yellow (jaundice).  You have a decrease in the amount of urine or are urinating less often.  Your urine turns a dark color or changes to pink, red, or brown. Document Released: 08/09/2000 Document Revised: 11/04/2011 Document Reviewed: 03/28/2008 [facility] Patient Information 2014 [place], [place].  _______________________________________________________________________

## 2015-07-18 NOTE — Progress Notes (Addendum)
Cardiac clearnce note dr Johney Frame 06-28-15 epic and on chart, medical clearance dr Para March on chart Last device check note 06-28-15 epic ekg 05-16-15 No rep needed per dr Johney Frame deivce orders on chart

## 2015-07-24 ENCOUNTER — Encounter (HOSPITAL_COMMUNITY): Payer: Self-pay

## 2015-07-24 ENCOUNTER — Encounter (HOSPITAL_COMMUNITY)
Admission: RE | Admit: 2015-07-24 | Discharge: 2015-07-24 | Disposition: A | Discharge status: Home / Self Care | Payer: Commercial Managed Care - PPO | Source: Ambulatory Visit | Attending: Orthopedic Surgery | Admitting: Orthopedic Surgery

## 2015-07-24 ENCOUNTER — Other Ambulatory Visit: Payer: Self-pay | Admitting: Internal Medicine

## 2015-07-24 DIAGNOSIS — Z01818 Encounter for other preprocedural examination: Secondary | ICD-10-CM | POA: Insufficient documentation

## 2015-07-24 HISTORY — DX: Unspecified urinary incontinence: R32

## 2015-07-24 HISTORY — DX: Hematuria, unspecified: R31.9

## 2015-07-24 HISTORY — DX: Gout, unspecified: M10.9

## 2015-07-24 LAB — COMPREHENSIVE METABOLIC PANEL
ALBUMIN: 4.4 g/dL (ref 3.5–5.0)
ALK PHOS: 80 U/L (ref 38–126)
ALT: 23 U/L (ref 17–63)
AST: 30 U/L (ref 15–41)
Anion gap: 6 (ref 5–15)
BILIRUBIN TOTAL: 1.4 mg/dL — AB (ref 0.3–1.2)
BUN: 15 mg/dL (ref 6–20)
CALCIUM: 10.1 mg/dL (ref 8.9–10.3)
CO2: 34 mmol/L — ABNORMAL HIGH (ref 22–32)
Chloride: 103 mmol/L (ref 101–111)
Creatinine, Ser: 0.88 mg/dL (ref 0.61–1.24)
GFR calc Af Amer: >60 mL/min (ref >60)
GFR calc non Af Amer: >60 mL/min (ref >60)
GLUCOSE: 126 mg/dL — AB (ref 65–99)
Potassium: 4.7 mmol/L (ref 3.5–5.1)
Sodium: 143 mmol/L (ref 135–145)
TOTAL PROTEIN: 7.5 g/dL (ref 6.5–8.1)

## 2015-07-24 LAB — SURGICAL PCR SCREEN
MRSA, PCR: NEGATIVE
Staphylococcus aureus: POSITIVE — AB

## 2015-07-24 LAB — URINALYSIS, ROUTINE W REFLEX MICROSCOPIC
BILIRUBIN URINE: NEGATIVE
GLUCOSE, UA: NEGATIVE mg/dL
KETONES UR: NEGATIVE mg/dL
Leukocytes, UA: NEGATIVE
NITRITE: NEGATIVE
PROTEIN: NEGATIVE mg/dL
Specific Gravity, Urine: 1.011 (ref 1.005–1.030)
pH: 5.5 (ref 5.0–8.0)

## 2015-07-24 LAB — CBC
HEMATOCRIT: 43.4 % (ref 39.0–52.0)
HEMOGLOBIN: 14.9 g/dL (ref 13.0–17.0)
MCH: 32.4 pg (ref 26.0–34.0)
MCHC: 34.3 g/dL (ref 30.0–36.0)
MCV: 94.3 fL (ref 78.0–100.0)
Platelets: 200 10*3/uL (ref 150–400)
RBC: 4.6 MIL/uL (ref 4.22–5.81)
RDW: 13.4 % (ref 11.5–15.5)
WBC: 7 10*3/uL (ref 4.0–10.5)

## 2015-07-24 LAB — APTT: APTT: 40 s — AB (ref 24–37)

## 2015-07-24 LAB — PROTIME-INR
INR: 1.85 — ABNORMAL HIGH (ref 0.00–1.49)
Prothrombin Time: 21.2 seconds — ABNORMAL HIGH (ref 11.6–15.2)

## 2015-07-24 LAB — URINE MICROSCOPIC-ADD ON: Squamous Epithelial / LPF: NONE SEEN

## 2015-07-24 NOTE — Progress Notes (Signed)
Micro ua results sent to dr [employer] faxed by epic

## 2015-07-30 ENCOUNTER — Ambulatory Visit: Payer: Self-pay | Admitting: Orthopedic Surgery

## 2015-07-30 MED ORDER — DEXTROSE 5 % IV SOLN
3.0000 g | INTRAVENOUS | Status: DC
  Filled 2015-07-30: qty 3000

## 2015-07-30 NOTE — H&P (Signed)
[name] [name] DOB: 1941/04/27 Married / Language: English / Race: White Male Date of Admission:  07/31/2015 CC:  Right Knee Pain History of Present Illness The patient is a 74 year old male who comes in for a preoperative History and Physical. The patient is scheduled for a right total knee arthroplasty to be performed by Dr. Gus Rankin. Aluisio, MD at [facility] on 07-31-2015. His biggest problem, however, is his right knee. He has documented end stage arthritis of the knee. His pain is getting progressively worse now. He has had injections in the past without benefit. He has had a very successful left knee replacement and is now at a stage where he would like to get his right knee replaced. He has got advanced tricompartmental arthritis of the knee. At this point, the most predictable means of improving his pain and function is going to be total knee arthroplasty. Discussed in detail today including the differences now compared to when he had the other side done. He wants to proceed with surgery. They have been treated conservatively in the past for the above stated problem and despite conservative measures, they continue to have progressive pain and severe functional limitations and dysfunction. They have failed non-operative management including home exercise, medications, and injections. It is felt that they would benefit from undergoing total joint replacement. Risks and benefits of the procedure have been discussed with the patient and they elect to proceed with surgery. There are no active contraindications to surgery such as ongoing infection or rapidly progressive neurological disease.  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 Mother  Breast Cancer. Bone Cancer  Social History Tobacco use  Former smoker. Alcohol use  Moderate alcohol use. Marital status  Married. Children  2 Post-Surgical Plans  Home with spouse Advance Directives  Healthcare POA  Medication History Allopurinol (  Tablet, Oral daily) Active. AmLODIPine Besylate (  Tablet, Oral two times daily) Active. Benazepril HCl (  Tablet, Oral two times daily) Active. Carvedilol (6.25MG  Tablet, 2 Oral daily) Active. Colchicine (0.6MG  Capsule, Oral as needed) Active. Cardura (  Tablet, Oral daily) Active. Famciclovir (  Tablet, Oral three times daily, as needed) Active. Finasteride (  Tablet, Oral daily) Active. FLUoxetine HCl (  Capsule, Oral daily) Active. Hydrochlorothiazide (  Tablet, 1/2 tab Oral daily) Active. Multiple Vitamin (1 Oral daily) Active. Oxycodone-Acetaminophen (5-325MG  Tablet, Oral as needed) Active. Xarelto (  Tablet, Oral daily) Active. Oxybutynin Chloride ER (  Tablet ER 24HR, Oral daily) Active. Cholesterol Management (Oral daily) Active. (Cholesterol Complete)  Past Surgical History Stenting  Dr. Deborah Chalk Gallbladder Surgery  Date: 05/1993. Hernia Repair  Date: 12/1994. Dr. Darnell Level Hiatal Hernia Surgery  Date: 04/1998. Dr. Darnell Level Cataract Surgery  Date: 08/2006. Dr. Tyrell Antonio Left Total Knee Replacement  Date: 06/2008. Dr. Lequita Halt Pacemaker Placement  Date: 10/2009. Dr. Deborah Chalk Cardiac Ablation  Date: 10/2010. Dr. Alden Hipp Retinal Eye Surgery  Dr. Allyne Gee - Feb 2013 and March 2013: Dr. Luciana Axe - April 2013 and May 2013 Laser Eye Surgery  Date: 04/2012. Dr. Luciana Axe Eye Procedure (Removal of Oil)  Date: 05/2012. Dr. Luciana Axe Corrective Eye Surgery  Date: 05/2012. Dr. Luciana Axe Cardiac Cath Procedure  Date: 06/2013. Dr.  Cooper Eye Repair  Procedure  Date: 03/2014. Dr. Luciana Axe    Review of Systems General Not Present- Chills, Fatigue, Fever, Memory Loss, Night Sweats, Weight Gain and Weight Loss. Skin Not Present- Eczema, Hives, Itching, Lesions and Rash. HEENT Not Present- Dentures, Double Vision, Headache, Hearing Loss, Tinnitus and Visual Loss. Respiratory Not Present- Allergies, Chronic Cough, Coughing up blood, Shortness of breath at rest and Shortness of breath with exertion. Cardiovascular Not Present- Chest Pain, Difficulty Breathing Lying Down, Murmur, Palpitations, Racing/skipping heartbeats and Swelling. Gastrointestinal Not Present- Abdominal Pain, Bloody Stool, Constipation, Diarrhea, Difficulty Swallowing, Heartburn, Jaundice, Loss of appetitie, Nausea and Vomiting. Male Genitourinary Present- Blood in Urine (Urology thinks it coming from NSAID and blood thinner) and Urinary frequency. Not Present- Discharge, Flank Pain, Incontinence, Painful Urination, Urgency, Urinary Retention, Urinating at Night and Weak urinary stream. Musculoskeletal Present- Joint Pain, Joint Swelling and Morning Stiffness. Not Present- Back Pain, Muscle Pain, Muscle Weakness and Spasms. Neurological Not Present- Blackout spells, Difficulty with balance, Dizziness, Paralysis, Tremor and Weakness. Psychiatric Not Present- Insomnia.  Vitals Weight: 287 lb Height: 72in Weight was reported by patient. Height was reported by patient. Body Surface Area: 2.48 m Body Mass Index: 38.92 kg/m  BP: 146/82 (Sitting, Right Arm, Standard)  Physical Exam  General Mental Status -Alert, cooperative and good historian. General Appearance-pleasant, Not in acute distress. Orientation-Oriented X3. Build & Nutrition-Well nourished and Well developed.  Head and Neck Head-normocephalic, atraumatic . Neck Global Assessment - supple, no bruit auscultated on the right, no bruit auscultated on the left.  Eye Vision-Wears corrective  lenses. Pupil - Bilateral-Regular and Round. Motion - Bilateral-EOMI.  Chest and Lung Exam Auscultation Breath sounds - clear at anterior chest wall and clear at posterior chest wall. Adventitious sounds - No Adventitious sounds.  Cardiovascular Auscultation Rhythm - Regular rate and rhythm. Heart Sounds - S1 WNL and S2 WNL. Murmurs & Other Heart Sounds - Auscultation of the heart reveals - No Murmurs.  Abdomen Inspection Contour - Generalized moderate distention. Palpation/Percussion Tenderness - Abdomen is non-tender to palpation. Rigidity (guarding) - Abdomen is soft. Auscultation Auscultation of the abdomen reveals - Bowel sounds normal.  Male Genitourinary Note: Not done, not pertinent to present illness   Musculoskeletal Note: On exam, he is alert and oriented, in no apparent distress. His right knee shows no swelling. His range is about 5 to 120. There is marked crepitus on range of motion with tenderness medial greater than lateral and no instability. His left hip has active flexion now to 100. Previously he could not flex it at all. He has no pain on range of motion of the hip.  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 Para March - Patient has been seen preoperatively and felt to be stable for surgery. Cards: Dr. Johney Frame - Patient has been seen preoperatively and felt to be stable for surgery.  Topical TXA - Heart Stenting  Anesthesia Issues: None  Comments: The patient was instructed to stop the Xarelto 5 days prior to surgery.  Signed electronically by Lauraine Rinne, III PA-C

## 2015-07-31 ENCOUNTER — Inpatient Hospital Stay (HOSPITAL_COMMUNITY): Payer: Commercial Managed Care - PPO | Admitting: Anesthesiology

## 2015-07-31 ENCOUNTER — Encounter (HOSPITAL_COMMUNITY): Payer: Self-pay | Admitting: *Deleted

## 2015-07-31 ENCOUNTER — Encounter (HOSPITAL_COMMUNITY)
Admission: RE | Disposition: A | Discharge status: Home Health | Payer: Self-pay | Source: Ambulatory Visit | Attending: Orthopedic Surgery

## 2015-07-31 ENCOUNTER — Inpatient Hospital Stay (HOSPITAL_COMMUNITY)
Admission: RE | Admit: 2015-07-31 | Discharge: 2015-08-01 | DRG: 470 | Disposition: A | Discharge status: Home Health | Payer: Commercial Managed Care - PPO | Source: Ambulatory Visit | Attending: Orthopedic Surgery | Admitting: Orthopedic Surgery

## 2015-07-31 DIAGNOSIS — Z96652 Presence of left artificial knee joint: Secondary | ICD-10-CM | POA: Diagnosis not present

## 2015-07-31 DIAGNOSIS — Z87891 Personal history of nicotine dependence: Secondary | ICD-10-CM | POA: Diagnosis not present

## 2015-07-31 DIAGNOSIS — I10 Essential (primary) hypertension: Secondary | ICD-10-CM | POA: Diagnosis not present

## 2015-07-31 DIAGNOSIS — G4733 Obstructive sleep apnea (adult) (pediatric): Secondary | ICD-10-CM | POA: Diagnosis present

## 2015-07-31 DIAGNOSIS — M1710 Unilateral primary osteoarthritis, unspecified knee: Secondary | ICD-10-CM | POA: Diagnosis present

## 2015-07-31 DIAGNOSIS — M179 Osteoarthritis of knee, unspecified: Secondary | ICD-10-CM | POA: Diagnosis present

## 2015-07-31 DIAGNOSIS — I2510 Atherosclerotic heart disease of native coronary artery without angina pectoris: Secondary | ICD-10-CM | POA: Diagnosis present

## 2015-07-31 DIAGNOSIS — M25561 Pain in right knee: Secondary | ICD-10-CM | POA: Diagnosis present

## 2015-07-31 DIAGNOSIS — K219 Gastro-esophageal reflux disease without esophagitis: Secondary | ICD-10-CM | POA: Diagnosis not present

## 2015-07-31 DIAGNOSIS — Z950 Presence of cardiac pacemaker: Secondary | ICD-10-CM

## 2015-07-31 DIAGNOSIS — Z87442 Personal history of urinary calculi: Secondary | ICD-10-CM | POA: Diagnosis not present

## 2015-07-31 DIAGNOSIS — M1711 Unilateral primary osteoarthritis, right knee: Principal | ICD-10-CM | POA: Diagnosis present

## 2015-07-31 DIAGNOSIS — Z6838 Body mass index (BMI) 38.0-38.9, adult: Secondary | ICD-10-CM | POA: Diagnosis not present

## 2015-07-31 DIAGNOSIS — Z01812 Encounter for preprocedural laboratory examination: Secondary | ICD-10-CM | POA: Diagnosis not present

## 2015-07-31 HISTORY — PX: TOTAL KNEE ARTHROPLASTY: SHX125

## 2015-07-31 LAB — PROTIME-INR
INR: 1.29 (ref 0.00–1.49)
PROTHROMBIN TIME: 16.2 s — AB (ref 11.6–15.2)

## 2015-07-31 LAB — TYPE AND SCREEN
ABO/RH(D): A POS
Antibody Screen: NEGATIVE

## 2015-07-31 LAB — APTT: APTT: 30 s (ref 24–37)

## 2015-07-31 SURGERY — ARTHROPLASTY, KNEE, TOTAL
Anesthesia: Spinal | Site: Knee | Laterality: Right

## 2015-07-31 MED ORDER — ACETAMINOPHEN 325 MG PO TABS: 650.0000 mg | ORAL_TABLET | Freq: Four times a day (QID) | ORAL | Status: DC | PRN

## 2015-07-31 MED ORDER — DEXTROSE 5 % IV SOLN
3.0000 g | Freq: Once | INTRAVENOUS | Status: AC
  Administered 2015-07-31: 3 g via INTRAVENOUS
  Filled 2015-07-31: qty 3000

## 2015-07-31 MED ORDER — PHENYLEPHRINE HCL 10 MG/ML IJ SOLN
INTRAMUSCULAR | Status: AC
  Filled 2015-07-31: qty 1

## 2015-07-31 MED ORDER — METHOCARBAMOL 1000 MG/10ML IJ SOLN
500.0000 mg | Freq: Four times a day (QID) | INTRAVENOUS | Status: DC | PRN
  Filled 2015-07-31: qty 5

## 2015-07-31 MED ORDER — SODIUM CHLORIDE 0.9 % IV SOLN: INTRAVENOUS | Status: DC

## 2015-07-31 MED ORDER — ALLOPURINOL 300 MG PO TABS
300.0000 mg | ORAL_TABLET | Freq: Every day | ORAL | Status: DC
  Administered 2015-07-31 – 2015-08-01 (×2): 300 mg via ORAL
  Filled 2015-07-31 (×2): qty 1

## 2015-07-31 MED ORDER — ACETAMINOPHEN 10 MG/ML IV SOLN
INTRAVENOUS | Status: AC
  Filled 2015-07-31: qty 100

## 2015-07-31 MED ORDER — ACETAMINOPHEN 500 MG PO TABS
1000.0000 mg | ORAL_TABLET | Freq: Four times a day (QID) | ORAL | Status: AC
  Administered 2015-07-31 – 2015-08-01 (×4): 1000 mg via ORAL
  Filled 2015-07-31: qty 1
  Filled 2015-07-31 (×4): qty 2

## 2015-07-31 MED ORDER — BUPIVACAINE HCL (PF) 0.25 % IJ SOLN
INTRAMUSCULAR | Status: AC
  Filled 2015-07-31: qty 30

## 2015-07-31 MED ORDER — SODIUM CHLORIDE 0.45 % IV SOLN
INTRAVENOUS | Status: DC
  Administered 2015-07-31: 22:00:00 via INTRAVENOUS
  Administered 2015-07-31: 100 mL/h via INTRAVENOUS

## 2015-07-31 MED ORDER — METOCLOPRAMIDE HCL 5 MG/ML IJ SOLN: 5.0000 mg | Freq: Three times a day (TID) | INTRAMUSCULAR | Status: DC | PRN

## 2015-07-31 MED ORDER — SODIUM CHLORIDE 0.9 % IJ SOLN
INTRAMUSCULAR | Status: AC
  Filled 2015-07-31: qty 50

## 2015-07-31 MED ORDER — CEFAZOLIN SODIUM-DEXTROSE 2-3 GM-% IV SOLR
2.0000 g | Freq: Four times a day (QID) | INTRAVENOUS | Status: AC
  Administered 2015-07-31 (×2): 2 g via INTRAVENOUS
  Filled 2015-07-31 (×2): qty 50

## 2015-07-31 MED ORDER — PROPOFOL 500 MG/50ML IV EMUL
INTRAVENOUS | Status: DC | PRN
  Administered 2015-07-31: 140 ug/kg/min via INTRAVENOUS

## 2015-07-31 MED ORDER — TRANEXAMIC ACID 1000 MG/10ML IV SOLN
2000.0000 mg | Freq: Once | INTRAVENOUS | Status: DC
  Filled 2015-07-31: qty 20

## 2015-07-31 MED ORDER — FINASTERIDE 5 MG PO TABS
5.0000 mg | ORAL_TABLET | Freq: Every day | ORAL | Status: DC
Start: 2015-07-31 — End: 2015-08-01
  Administered 2015-07-31 – 2015-08-01 (×2): 5 mg via ORAL
  Filled 2015-07-31 (×2): qty 1

## 2015-07-31 MED ORDER — BUPIVACAINE LIPOSOME 1.3 % IJ SUSP
20.0000 mL | Freq: Once | INTRAMUSCULAR | Status: DC
  Filled 2015-07-31: qty 20

## 2015-07-31 MED ORDER — DEXAMETHASONE SODIUM PHOSPHATE 10 MG/ML IJ SOLN
INTRAMUSCULAR | Status: AC
  Filled 2015-07-31: qty 1

## 2015-07-31 MED ORDER — PHENOL 1.4 % MT LIQD: 1.0000 | OROMUCOSAL | Status: DC | PRN

## 2015-07-31 MED ORDER — ATROPINE SULFATE 0.4 MG/ML IJ SOLN
INTRAMUSCULAR | Status: AC
  Filled 2015-07-31: qty 1

## 2015-07-31 MED ORDER — ZOLPIDEM TARTRATE 5 MG PO TABS: 5.0000 mg | ORAL_TABLET | Freq: Every evening | ORAL | Status: DC | PRN

## 2015-07-31 MED ORDER — DEXAMETHASONE SODIUM PHOSPHATE 10 MG/ML IJ SOLN
10.0000 mg | Freq: Once | INTRAMUSCULAR | Status: AC
  Administered 2015-08-01: 10 mg via INTRAVENOUS
  Filled 2015-07-31: qty 1

## 2015-07-31 MED ORDER — DIPHENHYDRAMINE HCL 12.5 MG/5ML PO ELIX: 12.5000 mg | ORAL_SOLUTION | ORAL | Status: DC | PRN

## 2015-07-31 MED ORDER — COLCHICINE 0.6 MG PO TABS
0.6000 mg | ORAL_TABLET | Freq: Every day | ORAL | Status: DC | PRN
  Filled 2015-07-31: qty 1

## 2015-07-31 MED ORDER — OXYCODONE HCL 5 MG PO TABS
5.0000 mg | ORAL_TABLET | ORAL | Status: DC | PRN
  Administered 2015-07-31: 15 mg via ORAL
  Administered 2015-07-31: 5 mg via ORAL
  Administered 2015-07-31: 20 mg via ORAL
  Administered 2015-07-31: 10 mg via ORAL
  Administered 2015-07-31 – 2015-08-01 (×2): 20 mg via ORAL
  Administered 2015-08-01: 10 mg via ORAL
  Administered 2015-08-01: 20 mg via ORAL
  Filled 2015-07-31: qty 2
  Filled 2015-07-31 (×4): qty 4
  Filled 2015-07-31: qty 2
  Filled 2015-07-31: qty 3
  Filled 2015-07-31: qty 4
  Filled 2015-07-31: qty 1

## 2015-07-31 MED ORDER — ONDANSETRON HCL 4 MG/2ML IJ SOLN: 4.0000 mg | Freq: Once | INTRAMUSCULAR | Status: DC | PRN

## 2015-07-31 MED ORDER — MIDAZOLAM HCL 2 MG/2ML IJ SOLN
INTRAMUSCULAR | Status: AC
  Filled 2015-07-31: qty 2

## 2015-07-31 MED ORDER — OXYBUTYNIN CHLORIDE ER 15 MG PO TB24
15.0000 mg | ORAL_TABLET | Freq: Every day | ORAL | Status: DC
  Administered 2015-07-31: 15 mg via ORAL
  Filled 2015-07-31 (×2): qty 1

## 2015-07-31 MED ORDER — LACTATED RINGERS IV SOLN
INTRAVENOUS | Status: DC | PRN
  Administered 2015-07-31 (×2): via INTRAVENOUS

## 2015-07-31 MED ORDER — FLEET ENEMA 7-19 GM/118ML RE ENEM: 1.0000 | ENEMA | Freq: Once | RECTAL | Status: DC | PRN

## 2015-07-31 MED ORDER — SODIUM CHLORIDE 0.9 % IJ SOLN
INTRAMUSCULAR | Status: AC
  Filled 2015-07-31: qty 10

## 2015-07-31 MED ORDER — ONDANSETRON HCL 4 MG/2ML IJ SOLN
INTRAMUSCULAR | Status: AC
  Filled 2015-07-31: qty 2

## 2015-07-31 MED ORDER — METHOCARBAMOL 500 MG PO TABS
500.0000 mg | ORAL_TABLET | Freq: Four times a day (QID) | ORAL | Status: DC | PRN
  Administered 2015-07-31 – 2015-08-01 (×4): 500 mg via ORAL
  Filled 2015-07-31 (×5): qty 1

## 2015-07-31 MED ORDER — DOCUSATE SODIUM 100 MG PO CAPS
100.0000 mg | ORAL_CAPSULE | Freq: Two times a day (BID) | ORAL | Status: DC
  Administered 2015-07-31 – 2015-08-01 (×2): 100 mg via ORAL

## 2015-07-31 MED ORDER — ONDANSETRON HCL 4 MG/2ML IJ SOLN: 4.0000 mg | Freq: Four times a day (QID) | INTRAMUSCULAR | Status: DC | PRN

## 2015-07-31 MED ORDER — BUPIVACAINE LIPOSOME 1.3 % IJ SUSP
INTRAMUSCULAR | Status: DC | PRN
  Administered 2015-07-31: 20 mL

## 2015-07-31 MED ORDER — AMLODIPINE BESYLATE 10 MG PO TABS
10.0000 mg | ORAL_TABLET | Freq: Every day | ORAL | Status: DC
  Administered 2015-08-01: 10 mg via ORAL
  Filled 2015-07-31: qty 1

## 2015-07-31 MED ORDER — FENTANYL CITRATE (PF) 100 MCG/2ML IJ SOLN
INTRAMUSCULAR | Status: DC | PRN
  Administered 2015-07-31: 25 ug via INTRAVENOUS

## 2015-07-31 MED ORDER — FENTANYL CITRATE (PF) 100 MCG/2ML IJ SOLN: 25.0000 ug | INTRAMUSCULAR | Status: DC | PRN

## 2015-07-31 MED ORDER — SODIUM CHLORIDE 0.9 % IJ SOLN
INTRAMUSCULAR | Status: DC | PRN
  Administered 2015-07-31: 30 mL

## 2015-07-31 MED ORDER — BUPIVACAINE HCL 0.25 % IJ SOLN
INTRAMUSCULAR | Status: DC | PRN
  Administered 2015-07-31: 20 mL

## 2015-07-31 MED ORDER — POLYETHYLENE GLYCOL 3350 17 G PO PACK: 17.0000 g | PACK | Freq: Every day | ORAL | Status: DC | PRN

## 2015-07-31 MED ORDER — ACETAMINOPHEN 650 MG RE SUPP: 650.0000 mg | Freq: Four times a day (QID) | RECTAL | Status: DC | PRN

## 2015-07-31 MED ORDER — ONDANSETRON HCL 4 MG PO TABS: 4.0000 mg | ORAL_TABLET | Freq: Four times a day (QID) | ORAL | Status: DC | PRN

## 2015-07-31 MED ORDER — FENTANYL CITRATE (PF) 100 MCG/2ML IJ SOLN
INTRAMUSCULAR | Status: AC
  Filled 2015-07-31: qty 2

## 2015-07-31 MED ORDER — PHENYLEPHRINE HCL 10 MG/ML IJ SOLN
INTRAMUSCULAR | Status: DC | PRN
  Administered 2015-07-31 (×3): 40 ug via INTRAVENOUS

## 2015-07-31 MED ORDER — BENAZEPRIL HCL 20 MG PO TABS
20.0000 mg | ORAL_TABLET | Freq: Every day | ORAL | Status: DC
  Administered 2015-08-01: 20 mg via ORAL
  Filled 2015-07-31: qty 1

## 2015-07-31 MED ORDER — MENTHOL 3 MG MT LOZG: 1.0000 | LOZENGE | OROMUCOSAL | Status: DC | PRN

## 2015-07-31 MED ORDER — RIVAROXABAN 10 MG PO TABS
10.0000 mg | ORAL_TABLET | Freq: Every day | ORAL | Status: DC
  Administered 2015-08-01: 10 mg via ORAL
  Filled 2015-07-31 (×2): qty 1

## 2015-07-31 MED ORDER — BISACODYL 10 MG RE SUPP: 10.0000 mg | Freq: Every day | RECTAL | Status: DC | PRN

## 2015-07-31 MED ORDER — PROPOFOL 10 MG/ML IV BOLUS
INTRAVENOUS | Status: AC
Start: 2015-07-31 — End: 2015-07-31
  Filled 2015-07-31: qty 40

## 2015-07-31 MED ORDER — PROPOFOL 10 MG/ML IV BOLUS
INTRAVENOUS | Status: AC
  Filled 2015-07-31: qty 20

## 2015-07-31 MED ORDER — BENAZEPRIL HCL 40 MG PO TABS: 40.0000 mg | ORAL_TABLET | Freq: Two times a day (BID) | ORAL | Status: DC

## 2015-07-31 MED ORDER — ONDANSETRON HCL 4 MG/2ML IJ SOLN
INTRAMUSCULAR | Status: DC | PRN
  Administered 2015-07-31: 4 mg via INTRAVENOUS

## 2015-07-31 MED ORDER — DEXAMETHASONE SODIUM PHOSPHATE 10 MG/ML IJ SOLN: 10.0000 mg | Freq: Once | INTRAMUSCULAR | Status: DC

## 2015-07-31 MED ORDER — TRANEXAMIC ACID 1000 MG/10ML IV SOLN
2000.0000 mg | INTRAVENOUS | Status: DC | PRN
  Administered 2015-07-31: 2000 mg via TOPICAL

## 2015-07-31 MED ORDER — DEXAMETHASONE SODIUM PHOSPHATE 4 MG/ML IJ SOLN
INTRAMUSCULAR | Status: DC | PRN
  Administered 2015-07-31: 8 mg via INTRAVENOUS

## 2015-07-31 MED ORDER — ACETAMINOPHEN 10 MG/ML IV SOLN
1000.0000 mg | Freq: Once | INTRAVENOUS | Status: AC
  Administered 2015-07-31: 1000 mg via INTRAVENOUS

## 2015-07-31 MED ORDER — METOCLOPRAMIDE HCL 10 MG PO TABS: 5.0000 mg | ORAL_TABLET | Freq: Three times a day (TID) | ORAL | Status: DC | PRN

## 2015-07-31 MED ORDER — DOXAZOSIN MESYLATE 8 MG PO TABS
8.0000 mg | ORAL_TABLET | Freq: Every day | ORAL | Status: DC
  Administered 2015-08-01: 8 mg via ORAL
  Filled 2015-07-31: qty 1

## 2015-07-31 MED ORDER — CARVEDILOL 6.25 MG PO TABS
6.2500 mg | ORAL_TABLET | Freq: Two times a day (BID) | ORAL | Status: DC
  Filled 2015-07-31 (×2): qty 1

## 2015-07-31 MED ORDER — HYDROMORPHONE HCL 1 MG/ML IJ SOLN
0.5000 mg | INTRAMUSCULAR | Status: DC | PRN
  Administered 2015-07-31: 0.5 mg via INTRAVENOUS
  Filled 2015-07-31: qty 1

## 2015-07-31 MED ORDER — LACTATED RINGERS IV SOLN
INTRAVENOUS | Status: DC
  Administered 2015-07-31: 1000 mL via INTRAVENOUS
  Administered 2015-07-31: 11:00:00 via INTRAVENOUS

## 2015-07-31 MED ORDER — FLUOXETINE HCL 20 MG PO CAPS
20.0000 mg | ORAL_CAPSULE | Freq: Every day | ORAL | Status: DC
  Administered 2015-07-31 – 2015-08-01 (×2): 20 mg via ORAL
  Filled 2015-07-31 (×2): qty 1

## 2015-07-31 MED ORDER — HYDROCHLOROTHIAZIDE 12.5 MG PO CAPS
12.5000 mg | ORAL_CAPSULE | Freq: Every day | ORAL | Status: DC
  Administered 2015-08-01: 12.5 mg via ORAL
  Filled 2015-07-31: qty 1

## 2015-07-31 MED ORDER — EPHEDRINE SULFATE 50 MG/ML IJ SOLN
INTRAMUSCULAR | Status: AC
  Filled 2015-07-31: qty 1

## 2015-07-31 MED ORDER — BUPIVACAINE IN DEXTROSE 0.75-8.25 % IT SOLN
INTRATHECAL | Status: DC | PRN
  Administered 2015-07-31: 2 mL via INTRATHECAL

## 2015-07-31 MED ORDER — MIDAZOLAM HCL 5 MG/5ML IJ SOLN
INTRAMUSCULAR | Status: DC | PRN
  Administered 2015-07-31: 1 mg via INTRAVENOUS

## 2015-07-31 SURGICAL SUPPLY — 49 items
BAG DECANTER FOR FLEXI CONT (MISCELLANEOUS) ×2 IMPLANT
BAG ZIPLOCK 12X15 (MISCELLANEOUS) ×2 IMPLANT
BANDAGE ELASTIC 6 VELCRO ST LF (GAUZE/BANDAGES/DRESSINGS) ×2 IMPLANT
BLADE SAG 18X100X1.27 (BLADE) ×2 IMPLANT
BLADE SAW SGTL 11.0X1.19X90.0M (BLADE) ×2 IMPLANT
BOWL SMART MIX CTS (DISPOSABLE) ×2 IMPLANT
CAP KNEE TOTAL 3 SIGMA ×2 IMPLANT
CEMENT HV SMART SET (Cement) ×2 IMPLANT
CLOTH BEACON ORANGE TIMEOUT ST (SAFETY) ×2 IMPLANT
CUFF TOURN SGL QUICK 34 (TOURNIQUET CUFF) ×1
CUFF TRNQT CYL 34X4X40X1 (TOURNIQUET CUFF) ×1 IMPLANT
DECANTER SPIKE VIAL GLASS SM (MISCELLANEOUS) ×2 IMPLANT
DRAPE U-SHAPE 47X51 STRL (DRAPES) ×2 IMPLANT
DRSG ADAPTIC 3X8 NADH LF (GAUZE/BANDAGES/DRESSINGS) ×2 IMPLANT
DRSG PAD ABDOMINAL 8X10 ST (GAUZE/BANDAGES/DRESSINGS) ×2 IMPLANT
DURAPREP 26ML APPLICATOR (WOUND CARE) ×2 IMPLANT
ELECT REM PT RETURN 9FT ADLT (ELECTROSURGICAL) ×2
ELECTRODE REM PT RTRN 9FT ADLT (ELECTROSURGICAL) ×1 IMPLANT
EVACUATOR 1/8 PVC DRAIN (DRAIN) ×2 IMPLANT
GAUZE SPONGE 4X4 12PLY STRL (GAUZE/BANDAGES/DRESSINGS) ×2 IMPLANT
GLOVE BIO SURGEON STRL SZ7.5 (GLOVE) IMPLANT
GLOVE BIO SURGEON STRL SZ8 (GLOVE) ×2 IMPLANT
GLOVE BIOGEL PI IND STRL 6.5 (GLOVE) ×1 IMPLANT
GLOVE BIOGEL PI IND STRL 8 (GLOVE) ×1 IMPLANT
GLOVE BIOGEL PI INDICATOR 6.5 (GLOVE) ×1
GLOVE BIOGEL PI INDICATOR 8 (GLOVE) ×1
GLOVE SURG SS PI 6.5 STRL IVOR (GLOVE) ×2 IMPLANT
GOWN STRL REUS W/TWL LRG LVL3 (GOWN DISPOSABLE) ×4 IMPLANT
GOWN STRL REUS W/TWL XL LVL3 (GOWN DISPOSABLE) IMPLANT
HANDPIECE INTERPULSE COAX TIP (DISPOSABLE) ×1
IMMOBILIZER KNEE 20 (SOFTGOODS) ×2
IMMOBILIZER KNEE 20 THIGH 36 (SOFTGOODS) ×1 IMPLANT
MANIFOLD NEPTUNE II (INSTRUMENTS) ×2 IMPLANT
NS IRRIG 1000ML POUR BTL (IV SOLUTION) ×2 IMPLANT
PACK TOTAL KNEE CUSTOM (KITS) ×2 IMPLANT
PADDING CAST COTTON 6X4 STRL (CAST SUPPLIES) ×4 IMPLANT
POSITIONER SURGICAL ARM (MISCELLANEOUS) ×2 IMPLANT
SET HNDPC FAN SPRY TIP SCT (DISPOSABLE) ×1 IMPLANT
STRIP CLOSURE SKIN 1/2X4 (GAUZE/BANDAGES/DRESSINGS) ×2 IMPLANT
SUT MNCRL AB 4-0 PS2 18 (SUTURE) ×2 IMPLANT
SUT VIC AB 2-0 CT1 27 (SUTURE) ×3
SUT VIC AB 2-0 CT1 TAPERPNT 27 (SUTURE) ×3 IMPLANT
SUT VLOC 180 0 24IN GS25 (SUTURE) ×2 IMPLANT
SYR 50ML LL SCALE MARK (SYRINGE) ×2 IMPLANT
TRAY FOLEY W/METER SILVER 14FR (SET/KITS/TRAYS/PACK) IMPLANT
TRAY FOLEY W/METER SILVER 16FR (SET/KITS/TRAYS/PACK) ×2 IMPLANT
WATER STERILE IRR 1500ML POUR (IV SOLUTION) ×2 IMPLANT
WRAP KNEE MAXI GEL POST OP (GAUZE/BANDAGES/DRESSINGS) ×2 IMPLANT
YANKAUER SUCT BULB TIP 10FT TU (MISCELLANEOUS) ×2 IMPLANT

## 2015-07-31 NOTE — Anesthesia Procedure Notes (Signed)
Spinal Patient location during procedure: OR Staffing Anesthesiologist: Christabelle Hanzlik Resident/CRNA: Dion Saucier E Performed by: anesthesiologist and resident/CRNA  Preanesthetic Checklist Completed: patient identified, site marked, surgical consent, pre-op evaluation, timeout performed, IV checked, risks and benefits discussed and monitors and equipment checked Spinal Block Patient position: sitting Prep: Betadine Patient monitoring: heart rate, continuous pulse ox and blood pressure Location: L3-4 Injection technique: single-shot Needle Needle type: Spinocan  Needle gauge: 22 G Needle length: 9 cm Additional Notes Expiration date of kit checked and confirmed. Patient tolerated procedure well, without complications.  Two unsuccessful passes by CRNA. MD Dr. Jillyn Hidden took over and single pass with clear CSF return using 22ga spinocan.

## 2015-07-31 NOTE — Progress Notes (Signed)
RT placed patient on CPAP. Patient home setting is auto 9-20 cmH20. Oxygen bleed into tubing. Sterile water added to water chamber for humidification. Patient tolerating well. RT will continue to monitor.

## 2015-07-31 NOTE — Anesthesia Preprocedure Evaluation (Addendum)
Anesthesia Evaluation  Patient identified by MRN, date of birth, ID band Patient awake    Reviewed: Allergy & Precautions, NPO status , Patient's Chart, lab work & pertinent test results  History of Anesthesia Complications Negative for: history of anesthetic complications  Airway Mallampati: III  TM Distance: >3 FB Neck ROM: Full    Dental no notable dental hx. (+) Dental Advisory Given   Pulmonary sleep apnea , former smoker,    Pulmonary exam normal breath sounds clear to auscultation       Cardiovascular hypertension, Pt. on medications + CAD and + Cardiac Stents  Normal cardiovascular exam+ pacemaker  Rhythm:Regular Rate:Normal     Neuro/Psych PSYCHIATRIC DISORDERS Anxiety negative neurological ROS     GI/Hepatic Neg liver ROS, GERD  Medicated and Controlled,  Endo/Other  Morbid obesity  Renal/GU Renal disease  negative genitourinary   Musculoskeletal  (+) Arthritis ,   Abdominal (+) + obese,   Peds negative pediatric ROS (+)  Hematology negative hematology ROS (+)   Anesthesia Other Findings   Reproductive/Obstetrics negative OB ROS                            Anesthesia Physical Anesthesia Plan  ASA: III  Anesthesia Plan: Spinal   Post-op Pain Management:    Induction: Intravenous  Airway Management Planned:   Additional Equipment:   Intra-op Plan:   Post-operative Plan:   Informed Consent: I have reviewed the patients History and Physical, chart, labs and discussed the procedure including the risks, benefits and alternatives for the proposed anesthesia with the patient or authorized representative who has indicated his/her understanding and acceptance.   Dental advisory given  Plan Discussed with: CRNA  Anesthesia Plan Comments: (Off Xarelto for 7 days, since 11/28. Repeat lab work acceptable. Patient desires to proceed with spinal anesthetic. R/B/O discussed and  all questions answered.)       Anesthesia Quick Evaluation

## 2015-07-31 NOTE — Interval H&P Note (Signed)
History and Physical Interval Note:  07/31/2015 7:01 AM  [name] [name]  has presented today for surgery, with the diagnosis of OA OF RIGHT KNEE  The various methods of treatment have been discussed with the patient and family. After consideration of risks, benefits and other options for treatment, the patient has consented to  Procedure(s): RIGHT TOTAL KNEE ARTHROPLASTY (Right) as a surgical intervention .  The patient's history has been reviewed, patient examined, no change in status, stable for surgery.  I have reviewed the patient's chart and labs.  Questions were answered to the patient's satisfaction.     Loanne Drilling

## 2015-07-31 NOTE — Anesthesia Postprocedure Evaluation (Signed)
Anesthesia Post Note  Patient: [name] [name]  Procedure(s) Performed: Procedure(s) (LRB): RIGHT TOTAL KNEE ARTHROPLASTY (Right)  Patient location during evaluation: PACU Anesthesia Type: Spinal Level of consciousness: awake and alert Pain management: pain level controlled Vital Signs Assessment: post-procedure vital signs reviewed and stable Respiratory status: spontaneous breathing, nonlabored ventilation, respiratory function stable and patient connected to nasal cannula oxygen Cardiovascular status: blood pressure returned to baseline and stable Postop Assessment: spinal receding and no signs of nausea or vomiting Anesthetic complications: no    Last Vitals:  Filed Vitals:   07/31/15 1134 07/31/15 1230  BP:  115/67  Pulse: 59 60  Temp: 36.4 C 36.6 C  Resp: 16 16    Last Pain:  Filed Vitals:   07/31/15 1245  PainSc: 1                  Stanislaw Acton JENNETTE

## 2015-07-31 NOTE — Evaluation (Signed)
Physical Therapy Evaluation Patient Details Name: [name] [name] MRN: [id number] DOB: 08/03/1941 Today's Date: 07/31/2015   History of Present Illness  RTKA  Clinical Impression  Patient  Tolerated ambulating x 120'. Patient's wife reports that she  Will not be home on WEdnesday and Thursday, friends and family will cover. Wife and patient are interested in DC Tuesday to have opportunity for wife to  Assist first night home.  Patient will benefit from PT to address problems listed in note below to Dc to home.    Follow Up Recommendations Home health PT;Supervision/Assistance - 24 hour    Equipment Recommendations  None recommended by PT    Recommendations for Other Services       Precautions / Restrictions Precautions Precautions: Knee;Fall Required Braces or Orthoses: Knee Immobilizer - Right Knee Immobilizer - Right: Discontinue once straight leg raise with < 10 degree lag      Mobility  Bed Mobility Overal bed mobility: Needs Assistance Bed Mobility: Supine to Sit     Supine to sit: Min assist     General bed mobility comments: cues for technique  Transfers Overall transfer level: Needs assistance Equipment used: Rolling walker (2 wheeled) Transfers: Sit to/from Stand Sit to Stand: Min assist;From elevated surface         General transfer comment: cues for technique  Ambulation/Gait Ambulation/Gait assistance: Min assist Ambulation Distance (Feet): 130 Feet Assistive device: Rolling walker (2 wheeled) Gait Pattern/deviations: Step-to pattern;Step-through pattern;Antalgic     General Gait Details: cues for sequence  Stairs            Wheelchair Mobility    Modified Rankin (Stroke Patients Only)       Balance                                             Pertinent Vitals/Pain Pain Assessment: No/denies pain    Home Living Family/patient expects to be discharged to:: Private residence Living Arrangements:  Spouse/significant other Available Help at Discharge: Family;Friend(s) Type of Home: House Home Access: Stairs to enter   Secretary/administrator of Steps: 1 = Home Layout: Multi-level Home Equipment: Environmental consultant - 2 wheels      Prior Function Level of Independence: Independent               Hand Dominance        Extremity/Trunk Assessment               Lower Extremity Assessment: RLE deficits/detail RLE Deficits / Details: + SLR    Cervical / Trunk Assessment: Normal  Communication   Communication: No difficulties  Cognition Arousal/Alertness: Awake/alert Behavior During Therapy: WFL for tasks assessed/performed Overall Cognitive Status: Within Functional Limits for tasks assessed                      General Comments      Exercises        Assessment/Plan    PT Assessment    PT Diagnosis Difficulty walking   PT Problem List    PT Treatment Interventions     PT Goals (Current goals can be found in the Care Plan section) Acute Rehab PT Goals Patient Stated Goal: to go home tomorrow. PT Goal Formulation: With patient/family Time For Goal Achievement: 08/03/15 Potential to Achieve Goals: Good    Frequency     Barriers to discharge  Co-evaluation               End of Session Equipment Utilized During Treatment: Right knee immobilizer Activity Tolerance: Patient tolerated treatment well Patient left: in chair;with call bell/phone within reach;with family/visitor present Nurse Communication: Mobility status         Time: 4034-7425 PT Time Calculation (min) (ACUTE ONLY): 16 min   Charges:   PT Evaluation $Initial PT Evaluation Tier I: 1 Procedure     PT G CodesRada Hay 07/31/2015, 6:51 PM

## 2015-07-31 NOTE — Transfer of Care (Signed)
Immediate Anesthesia Transfer of Care Note  Patient: [name] [name]  Procedure(s) Performed: Procedure(s): RIGHT TOTAL KNEE ARTHROPLASTY (Right)  Patient Location: PACU  Anesthesia Type:Spinal  Level of Consciousness: awake, alert  and patient cooperative  Airway & Oxygen Therapy: Patient Spontanous Breathing and Patient connected to face mask oxygen  Post-op Assessment: Report given to RN, Post -op Vital signs reviewed and stable and SAB level T10.  Post vital signs: Reviewed and stable  Last Vitals:  Filed Vitals:   07/31/15 0635  BP: 134/69  Pulse: 62  Temp: 37 C  Resp: 18    Complications: No apparent anesthesia complications

## 2015-07-31 NOTE — H&P (View-Only) (Signed)
[name] [name] DOB: 04/15/1941 Married / Language: English / Race: White Male Date of Admission:  07/31/2015 CC:  Right Knee Pain History of Present Illness The patient is a 74 year old male who comes in for a preoperative History and Physical. The patient is scheduled for a right total knee arthroplasty to be performed by Dr. Frank V. Aluisio, MD at [facility] on 07-31-2015. His biggest problem, however, is his right knee. He has documented end stage arthritis of the knee. His pain is getting progressively worse now. He has had injections in the past without benefit. He has had a very successful left knee replacement and is now at a stage where he would like to get his right knee replaced. He has got advanced tricompartmental arthritis of the knee. At this point, the most predictable means of improving his pain and function is going to be total knee arthroplasty. Discussed in detail today including the differences now compared to when he had the other side done. He wants to proceed with surgery. They have been treated conservatively in the past for the above stated problem and despite conservative measures, they continue to have progressive pain and severe functional limitations and dysfunction. They have failed non-operative management including home exercise, medications, and injections. It is felt that they would benefit from undergoing total joint replacement. Risks and benefits of the procedure have been discussed with the patient and they elect to proceed with surgery. There are no active contraindications to surgery such as ongoing infection or rapidly progressive neurological disease.  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 Mother  Breast Cancer. Bone Cancer  Social History Tobacco use  Former smoker. Alcohol use  Moderate alcohol use. Marital status  Married. Children  2 Post-Surgical Plans  Home with spouse Advance Directives  Healthcare POA  Medication History Allopurinol (300MG Tablet, Oral daily) Active. AmLODIPine Besylate (10MG Tablet, Oral two times daily) Active. Benazepril HCl (20MG Tablet, Oral two times daily) Active. Carvedilol (6.25MG Tablet, 2 Oral daily) Active. Colchicine (0.6MG Capsule, Oral as needed) Active. Cardura (8MG Tablet, Oral daily) Active. Famciclovir (500MG Tablet, Oral three times daily, as needed) Active. Finasteride (5MG Tablet, Oral daily) Active. FLUoxetine HCl (20MG Capsule, Oral daily) Active. Hydrochlorothiazide (25MG Tablet, 1/2 tab Oral daily) Active. Multiple Vitamin (1 Oral daily) Active. Oxycodone-Acetaminophen (5-325MG Tablet, Oral as needed) Active. Xarelto (20MG Tablet, Oral daily) Active. Oxybutynin Chloride ER (15MG Tablet ER 24HR, Oral daily) Active. Cholesterol Management (Oral daily) Active. (Cholesterol Complete)  Past Surgical History Stenting  Dr. Tennant Gallbladder Surgery  Date: 05/1993. Hernia Repair  Date: 12/1994. Dr. Todd Gerkin Hiatal Hernia Surgery  Date: 04/1998. Dr. Todd Gerkin Cataract Surgery  Date: 08/2006. Dr. Eastmon Left Total Knee Replacement  Date: 06/2008. Dr. Aluisio Pacemaker Placement  Date: 10/2009. Dr. Tennant Cardiac Ablation  Date: 10/2010. Dr. Mark Allred Retinal Eye Surgery  Dr. Sanders - Feb 2013 and March 2013: Dr. Rankin - April 2013 and May 2013 Laser Eye Surgery  Date: 04/2012. Dr. Rankin Eye Procedure (Removal of Oil)  Date: 05/2012. Dr. Rankin Corrective Eye Surgery  Date: 05/2012. Dr. Rankin Cardiac Cath Procedure  Date: 06/2013. Dr.   Cooper Eye Repair  Procedure  Date: 03/2014. Dr. Luciana Axe    Review of Systems General Not Present- Chills, Fatigue, Fever, Memory Loss, Night Sweats, Weight Gain and Weight Loss. Skin Not Present- Eczema, Hives, Itching, Lesions and Rash. HEENT Not Present- Dentures, Double Vision, Headache, Hearing Loss, Tinnitus and Visual Loss. Respiratory Not Present- Allergies, Chronic Cough, Coughing up blood, Shortness of breath at rest and Shortness of breath with exertion. Cardiovascular Not Present- Chest Pain, Difficulty Breathing Lying Down, Murmur, Palpitations, Racing/skipping heartbeats and Swelling. Gastrointestinal Not Present- Abdominal Pain, Bloody Stool, Constipation, Diarrhea, Difficulty Swallowing, Heartburn, Jaundice, Loss of appetitie, Nausea and Vomiting. Male Genitourinary Present- Blood in Urine (Urology thinks it coming from NSAID and blood thinner) and Urinary frequency. Not Present- Discharge, Flank Pain, Incontinence, Painful Urination, Urgency, Urinary Retention, Urinating at Night and Weak urinary stream. Musculoskeletal Present- Joint Pain, Joint Swelling and Morning Stiffness. Not Present- Back Pain, Muscle Pain, Muscle Weakness and Spasms. Neurological Not Present- Blackout spells, Difficulty with balance, Dizziness, Paralysis, Tremor and Weakness. Psychiatric Not Present- Insomnia.  Vitals Weight: 287 lb Height: 72in Weight was reported by patient. Height was reported by patient. Body Surface Area: 2.48 m Body Mass Index: 38.92 kg/m  BP: 146/82 (Sitting, Right Arm, Standard)  Physical Exam  General Mental Status -Alert, cooperative and good historian. General Appearance-pleasant, Not in acute distress. Orientation-Oriented X3. Build & Nutrition-Well nourished and Well developed.  Head and Neck Head-normocephalic, atraumatic . Neck Global Assessment - supple, no bruit auscultated on the right, no bruit auscultated on the left.  Eye Vision-Wears corrective  lenses. Pupil - Bilateral-Regular and Round. Motion - Bilateral-EOMI.  Chest and Lung Exam Auscultation Breath sounds - clear at anterior chest wall and clear at posterior chest wall. Adventitious sounds - No Adventitious sounds.  Cardiovascular Auscultation Rhythm - Regular rate and rhythm. Heart Sounds - S1 WNL and S2 WNL. Murmurs & Other Heart Sounds - Auscultation of the heart reveals - No Murmurs.  Abdomen Inspection Contour - Generalized moderate distention. Palpation/Percussion Tenderness - Abdomen is non-tender to palpation. Rigidity (guarding) - Abdomen is soft. Auscultation Auscultation of the abdomen reveals - Bowel sounds normal.  Male Genitourinary Note: Not done, not pertinent to present illness   Musculoskeletal Note: On exam, he is alert and oriented, in no apparent distress. His right knee shows no swelling. His range is about 5 to 120. There is marked crepitus on range of motion with tenderness medial greater than lateral and no instability. His left hip has active flexion now to 100. Previously he could not flex it at all. He has no pain on range of motion of the hip.  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 Para March - Patient has been seen preoperatively and felt to be stable for surgery. Cards: Dr. Johney Frame - Patient has been seen preoperatively and felt to be stable for surgery.  Topical TXA - Heart Stenting  Anesthesia Issues: None  Comments: The patient was instructed to stop the Xarelto 5 days prior to surgery.  Signed electronically by Lauraine Rinne, III PA-C

## 2015-07-31 NOTE — Op Note (Signed)
Pre-operative diagnosis- Osteoarthritis  Right knee(s)  Post-operative diagnosis- Osteoarthritis Right knee(s)  Procedure-  Right  Total Knee Arthroplasty  Surgeon- Gus Rankin. [name] Laforge, MD  Assistant- [name] Ped, PA-C   Anesthesia-  Spinal  EBL-* No blood loss amount entered *   Drains Hemovac  Tourniquet time-  Total Tourniquet Time Documented: Thigh (Right) - 36 minutes Total: Thigh (Right) - 36 minutes     Complications- None  Condition-PACU - hemodynamically stable.   Brief Clinical Note  [name] [name] is a 74 y.o. year old male with end stage OA of his right knee with progressively worsening pain and dysfunction. He has constant pain, with activity and at rest and significant functional deficits with difficulties even with ADLs. He has had extensive non-op management including analgesics, injections of cortisone and viscosupplements, and home exercise program, but remains in significant pain with significant dysfunction. Radiographs show bone on bone arthritis medial and patellofemoral.He presents now for rightt Total Knee Arthroplasty.    Procedure in detail---   The patient is brought into the operating room and positioned supine on the operating table. After successful administration of  Spinal,   a tourniquet is placed high on the  Right thigh(s) and the lower extremity is prepped and draped in the usual sterile fashion. Time out is performed by the operating team and then the  Right lower extremity is wrapped in Esmarch, knee flexed and the tourniquet inflated to 300 mmHg.       A midline incision is made with a ten blade through the subcutaneous tissue to the level of the extensor mechanism. A fresh blade is used to make a medial parapatellar arthrotomy. Soft tissue over the proximal medial tibia is subperiosteally elevated to the joint line with a knife and into the semimembranosus bursa with a Cobb elevator. Soft tissue over the proximal lateral tibia is elevated with  attention being paid to avoiding the patellar tendon on the tibial tubercle. The patella is everted, knee flexed 90 degrees and the ACL and PCL are removed. Findings are bone on bone medial and patellofemoral with massive global osteophytes.        The drill is used to create a starting hole in the distal femur and the canal is thoroughly irrigated with sterile saline to remove the fatty contents. The 5 degree Right  valgus alignment guide is placed into the femoral canal and the distal femoral cutting block is pinned to remove 10 mm off the distal femur. Resection is made with an oscillating saw.      The tibia is subluxed forward and the menisci are removed. The extramedullary alignment guide is placed referencing proximally at the medial aspect of the tibial tubercle and distally along the second metatarsal axis and tibial crest. The block is pinned to remove 2mm off the more deficient medial  side. Resection is made with an oscillating saw. Size 5is the most appropriate size for the tibia and the proximal tibia is prepared with the modular drill and keel punch for that size.      The femoral sizing guide is placed and size 5 is most appropriate. Rotation is marked off the epicondylar axis and confirmed by creating a rectangular flexion gap at 90 degrees. The size 5 cutting block is pinned in this rotation and the anterior, posterior and chamfer cuts are made with the oscillating saw. The intercondylar block is then placed and that cut is made.      Trial size 5 tibial component, trial  size 5 posterior stabilized femur and a 12.5  mm posterior stabilized rotating platform insert trial is placed. Full extension is achieved with excellent varus/valgus and anterior/posterior balance throughout full range of motion. The patella is everted and thickness measured to be 27  mm. Free hand resection is taken to 15 mm, a 41 template is placed, lug holes are drilled, trial patella is placed, and it tracks normally.  Osteophytes are removed off the posterior femur with the trial in place. All trials are removed and the cut bone surfaces prepared with pulsatile lavage. Cement is mixed and once ready for implantation, the size 5 tibial implant, size  5 posterior stabilized femoral component, and the size 41 patella are cemented in place and the patella is held with the clamp. The trial insert is placed and the knee held in full extension. The Exparel (20 ml mixed with 30 ml saline) and .25% Bupivicaine, are injected into the extensor mechanism, posterior capsule, medial and lateral gutters and subcutaneous tissues.  All extruded cement is removed and once the cement is hard the permanent 12.5 mm posterior stabilized rotating platform insert is placed into the tibial tray.      The wound is copiously irrigated with saline solution and the extensor mechanism closed over a hemovac drain with #1 V-loc suture. The tourniquet is released for a total tourniquet time of 36  minutes. Flexion against gravity is 140 degrees and the patella tracks normally. Subcutaneous tissue is closed with 2.0 vicryl and subcuticular with running 4.0 Monocryl. The incision is cleaned and dried and steri-strips and a bulky sterile dressing are applied. The limb is placed into a knee immobilizer and the patient is awakened and transported to recovery in stable condition.      Please note that a surgical assistant was a medical necessity for this procedure in order to perform it in a safe and expeditious manner. Surgical assistant was necessary to retract the ligaments and vital neurovascular structures to prevent injury to them and also necessary for proper positioning of the limb to allow for anatomic placement of the prosthesis.   Gus Rankin [name] Secrist, MD    07/31/2015, 10:00 AM

## 2015-08-01 LAB — BASIC METABOLIC PANEL
ANION GAP: 6 (ref 5–15)
BUN: 17 mg/dL (ref 6–20)
CALCIUM: 9.5 mg/dL (ref 8.9–10.3)
CO2: 29 mmol/L (ref 22–32)
Chloride: 102 mmol/L (ref 101–111)
Creatinine, Ser: 0.88 mg/dL (ref 0.61–1.24)
GFR calc Af Amer: >60 mL/min (ref >60)
GLUCOSE: 166 mg/dL — AB (ref 65–99)
Potassium: 4.6 mmol/L (ref 3.5–5.1)
SODIUM: 137 mmol/L (ref 135–145)

## 2015-08-01 LAB — CBC
HCT: 37.8 % — ABNORMAL LOW (ref 39.0–52.0)
Hemoglobin: 13 g/dL (ref 13.0–17.0)
MCH: 32.5 pg (ref 26.0–34.0)
MCHC: 34.4 g/dL (ref 30.0–36.0)
MCV: 94.5 fL (ref 78.0–100.0)
PLATELETS: 159 10*3/uL (ref 150–400)
RBC: 4 MIL/uL — ABNORMAL LOW (ref 4.22–5.81)
RDW: 13.1 % (ref 11.5–15.5)
WBC: 14.9 10*3/uL — AB (ref 4.0–10.5)

## 2015-08-01 MED ORDER — OXYCODONE HCL 5 MG PO TABS: 5.0000 mg | ORAL_TABLET | ORAL | Status: DC | PRN

## 2015-08-01 MED ORDER — METHOCARBAMOL 500 MG PO TABS: 500.0000 mg | ORAL_TABLET | Freq: Four times a day (QID) | ORAL | Status: DC | PRN

## 2015-08-01 NOTE — Progress Notes (Signed)
Subjective: 1 Day Post-Op Procedure(s) (LRB): RIGHT TOTAL KNEE ARTHROPLASTY (Right) Patient reports pain as mild.   Patient seen in rounds with Dr. Lequita Halt. Patient is well, and has had no acute complaints or problems We will start therapy today.  If they do well with therapy and meets all goals, then will allow home later this afternoon following therapy. He walked 130 feet the day of surgery. Plan is to go Home after hospital stay.  Objective: Vital signs in last 24 hours: Temp:  [97.5 F (36.4 C)-98.3 F (36.8 C)] 98.3 F (36.8 C) (12/06 0629) Pulse Rate:  [59-63] 62 (12/06 0629) Resp:  [11-16] 15 (12/06 0629) BP: (100-131)/(57-79) 126/63 mmHg (12/06 0629) SpO2:  [94 %-100 %] 96 % (12/06 0629) Weight:  [134.265 kg (296 lb)] 134.265 kg (296 lb) (12/05 1134)  Intake/Output from previous day:  Intake/Output Summary (Last 24 hours) at 08/01/15 0805 Last data filed at 08/01/15 0757  Gross per 24 hour  Intake 4306.67 ml  Output   1760 ml  Net 2546.67 ml    Intake/Output this shift: Total I/O In: 240 [P.O.:240] Out: 50 [Urine:50]  Labs:  Recent Labs  08/01/15 0455  HGB 13.0    Recent Labs  08/01/15 0455  WBC 14.9*  RBC 4.00*  HCT 37.8*  PLT 159    Recent Labs  08/01/15 0455  NA 137  K 4.6  CL 102  CO2 29  BUN 17  CREATININE 0.88  GLUCOSE 166*  CALCIUM 9.5    Recent Labs  07/31/15 0718  INR 1.29    EXAM General - Patient is Alert, Appropriate and Oriented Extremity - Neurovascular intact Sensation intact distally Dorsiflexion/Plantar flexion intact Dressing - dressing C/D/I Motor Function - intact, moving foot and toes well on  Hemovac pulled without difficulty.  Past Medical History  Diagnosis Date  . CAD (coronary artery disease)     s/p PCI 2003. Last stress test in Feb 2011 was normal  . Persistent atrial fibrillation (HCC)     on Multaq and Pradaxa  . HTN (hypertension)   . ETOH abuse   . DJD (degenerative joint disease)     s/p Left TKA 06/2008  . Metabolic syndrome   . Hematuria   . Psychosexual dysfunction with inhibited sexual excitement   . Hypertrophy of prostate without urinary obstruction and other lower urinary tract symptoms (LUTS)   . Anxiety state, unspecified   . Allergic rhinitis, cause unspecified   . OSA on CPAP     compliant with CPAP  . Obesity   . Hyperlipidemia   . GERD (gastroesophageal reflux disease)   . Sick sinus syndrome [facility])     s/p PPM by Dr Deborah Chalk  . Atrial flutter (HCC)     s/p atrial flutter CTI ablation 10/26/10  . Hiatal hernia   . Gout   . Chronic kidney disease     Kidney Stones  . Gout   . Hematuria     worked up by dr Annabell Howells 1 month ago no problems found   . Urinary leakage     Assessment/Plan: 1 Day Post-Op Procedure(s) (LRB): RIGHT TOTAL KNEE ARTHROPLASTY (Right) Principal Problem:   OA (osteoarthritis) of knee  Estimated body mass index is 40.14 kg/(m^2) as calculated from the following:   Height as of this encounter: 6' (1.829 m).   Weight as of this encounter: 134.265 kg (296 lb). Advance diet Up with therapy Discharge home with home health  DVT Prophylaxis - Xarelto 10 mg  here today and then resume the 20 mg dosing at home tomorrow 08/02/2015 Weight-Bearing as tolerated to right leg D/C O2 and Pulse OX and try on Room Air  If meets goals and able to go home: Discharge home with home health Diet - Cardiac diet Follow up - in 2 weeks on Tuesday 12/20 Activity - WBAT Disposition - Home Condition Upon Discharge - Good D/C Meds - See DC Summary DVT Prophylaxis - Xarelto  Avel Peace, PA-C Orthopaedic Surgery

## 2015-08-01 NOTE — Care Management Note (Signed)
Case Management Note  Patient Details  Name: [name] [name] MRN: [id number] Date of Birth: [birth year]  Subjective/Objective:    S/p Right total knee replacement                Action/Plan: Discharge planning, spoke with patient at bedside, plans to d/c home with spouse. Have chosen [facility] for [facility]. Contacted [facility] for referral. Has RW in the room, declines need for 3-n-1.  Expected Discharge Date:                  Expected Discharge Plan:  Home w Home Health Services  In-House Referral:  NA  Discharge planning Services  CM Consult  Post Acute Care Choice:  Home Health Choice offered to:  Patient  DME Arranged:  N/A DME Agency:  NA  HH Arranged:  PT HH Agency:  [facility]  Status of Service:  Completed, signed off  Medicare Important Message Given:    Date Medicare IM Given:    Medicare IM give by:    Date Additional Medicare IM Given:    Additional Medicare Important Message give by:     If discussed at Long Length of Stay Meetings, dates discussed:    Additional Comments:  Alexis Goodell, RN 08/01/2015, 11:18 AM

## 2015-08-01 NOTE — Discharge Summary (Signed)
Physician Discharge Summary   Patient ID: [name] [name] MRN: [id number] DOB/AGE: [birth year] 74 y.o.  Admit date: 07/31/2015 Discharge date: 08-01-2015  Primary Diagnosis:  Osteoarthritis Right knee(s)  Admission Diagnoses:  Past Medical History  Diagnosis Date  . CAD (coronary artery disease)     s/p PCI 2003. Last stress test in Feb 2011 was normal  . Persistent atrial fibrillation ([place])     on Multaq and Pradaxa  . HTN (hypertension)   . ETOH abuse   . DJD (degenerative joint disease)     s/p Left TKA 06/2008  . Metabolic syndrome   . Hematuria   . Psychosexual dysfunction with inhibited sexual excitement   . Hypertrophy of prostate without urinary obstruction and other lower urinary tract symptoms (LUTS)   . Anxiety state, unspecified   . Allergic rhinitis, cause unspecified   . OSA on CPAP     compliant with CPAP  . Obesity   . Hyperlipidemia   . GERD (gastroesophageal reflux disease)   . Sick sinus syndrome [facility])     s/p PPM by Dr [name] [name]  . Atrial flutter ([place])     s/p atrial flutter CTI ablation 10/26/10  . Hiatal hernia   . Gout   . Chronic kidney disease     Kidney Stones  . Gout   . Hematuria     worked up by dr [name] [name] 1 month ago no problems found   . Urinary leakage    Discharge Diagnoses:   Principal Problem:   OA (osteoarthritis) of knee  Estimated body mass index is 40.14 kg/(m^2) as calculated from the following:   Height as of this encounter: 6' (1.829 m).   Weight as of this encounter: 134.265 kg (296 lb).  Procedure:  Procedure(s) (LRB): RIGHT TOTAL KNEE ARTHROPLASTY (Right)   Consults: None  HPI: [name] [name] is a 74 y.o. year old male with end stage OA of his right knee with progressively worsening pain and dysfunction. He has constant pain, with activity and at rest and significant functional deficits with difficulties even with ADLs. He has had extensive non-op management including analgesics, injections of cortisone and  viscosupplements, and home exercise program, but remains in significant pain with significant dysfunction. Radiographs show bone on bone arthritis medial and patellofemoral.He presents now for rightt Total Knee Arthroplasty.  Laboratory Data: Admission on 07/31/2015  Component Date Value Ref Range Status  . Prothrombin Time 07/31/2015 16.2* 11.6 - 15.2 seconds Final  . INR 07/31/2015 1.29  0.00 - 1.49 Final  . aPTT 07/31/2015 30  24 - 37 seconds Final  . WBC 08/01/2015 14.9* 4.0 - 10.5 K/uL Final  . RBC 08/01/2015 4.00* 4.22 - 5.81 MIL/uL Final  . Hemoglobin 08/01/2015 13.0  13.0 - 17.0 g/dL Final  . HCT 08/01/2015 37.8* 39.0 - 52.0 % Final  . MCV 08/01/2015 94.5  78.0 - 100.0 fL Final  . MCH 08/01/2015 32.5  26.0 - 34.0 pg Final  . MCHC 08/01/2015 34.4  30.0 - 36.0 g/dL Final  . RDW 08/01/2015 13.1  11.5 - 15.5 % Final  . Platelets 08/01/2015 159  150 - 400 K/uL Final  . Sodium 08/01/2015 137  135 - 145 mmol/L Final  . Potassium 08/01/2015 4.6  3.5 - 5.1 mmol/L Final  . Chloride 08/01/2015 102  101 - 111 mmol/L Final  . CO2 08/01/2015 29  22 - 32 mmol/L Final  . Glucose, Bld 08/01/2015 166* 65 - 99 mg/dL Final  . BUN 08/01/2015 17  6 - 20 mg/dL Final  . Creatinine, Ser 08/01/2015 0.88  0.61 - 1.24 mg/dL Final  . Calcium 08/01/2015 9.5  8.9 - 10.3 mg/dL Final  . GFR calc non Af Amer 08/01/2015 >60  >60 mL/min Final  . GFR calc Af Amer 08/01/2015 >60  >60 mL/min Final   Comment: (NOTE) The eGFR has been calculated using the CKD EPI equation. This calculation has not been validated in all clinical situations. eGFR's persistently <60 mL/min signify possible Chronic Kidney Disease.   Georgiann Hahn gap 08/01/2015 6  5 - 15 Final  Hospital Outpatient Visit on 07/24/2015  Component Date Value Ref Range Status  . aPTT 07/24/2015 40* 24 - 37 seconds Final   Comment:        IF BASELINE aPTT IS ELEVATED, SUGGEST PATIENT RISK ASSESSMENT BE USED TO DETERMINE APPROPRIATE ANTICOAGULANT THERAPY.    . WBC 07/24/2015 7.0  4.0 - 10.5 K/uL Final  . RBC 07/24/2015 4.60  4.22 - 5.81 MIL/uL Final  . Hemoglobin 07/24/2015 14.9  13.0 - 17.0 g/dL Final  . HCT 07/24/2015 43.4  39.0 - 52.0 % Final  . MCV 07/24/2015 94.3  78.0 - 100.0 fL Final  . MCH 07/24/2015 32.4  26.0 - 34.0 pg Final  . MCHC 07/24/2015 34.3  30.0 - 36.0 g/dL Final  . RDW 07/24/2015 13.4  11.5 - 15.5 % Final  . Platelets 07/24/2015 200  150 - 400 K/uL Final  . Sodium 07/24/2015 143  135 - 145 mmol/L Final  . Potassium 07/24/2015 4.7  3.5 - 5.1 mmol/L Final  . Chloride 07/24/2015 103  101 - 111 mmol/L Final  . CO2 07/24/2015 34* 22 - 32 mmol/L Final  . Glucose, Bld 07/24/2015 126* 65 - 99 mg/dL Final  . BUN 07/24/2015 15  6 - 20 mg/dL Final  . Creatinine, Ser 07/24/2015 0.88  0.61 - 1.24 mg/dL Final  . Calcium 07/24/2015 10.1  8.9 - 10.3 mg/dL Final  . Total Protein 07/24/2015 7.5  6.5 - 8.1 g/dL Final  . Albumin 07/24/2015 4.4  3.5 - 5.0 g/dL Final  . AST 07/24/2015 30  15 - 41 U/L Final  . ALT 07/24/2015 23  17 - 63 U/L Final  . Alkaline Phosphatase 07/24/2015 80  38 - 126 U/L Final  . Total Bilirubin 07/24/2015 1.4* 0.3 - 1.2 mg/dL Final  . GFR calc non Af Amer 07/24/2015 >60  >60 mL/min Final  . GFR calc Af Amer 07/24/2015 >60  >60 mL/min Final   Comment: (NOTE) The eGFR has been calculated using the CKD EPI equation. This calculation has not been validated in all clinical situations. eGFR's persistently <60 mL/min signify possible Chronic Kidney Disease.   . Anion gap 07/24/2015 6  5 - 15 Final  . Prothrombin Time 07/24/2015 21.2* 11.6 - 15.2 seconds Final  . INR 07/24/2015 1.85* 0.00 - 1.49 Final  . ABO/RH(D) 07/24/2015 A POS   Final  . Antibody Screen 07/24/2015 NEG   Final  . Sample Expiration 07/24/2015 08/03/2015   Final  . Extend sample reason 07/24/2015 NO TRANSFUSIONS OR PREGNANCY IN THE PAST 3 MONTHS   Final  . Color, Urine 07/24/2015 YELLOW  YELLOW Final  . APPearance 07/24/2015 CLEAR  CLEAR Final    . Specific Gravity, Urine 07/24/2015 1.011  1.005 - 1.030 Final  . pH 07/24/2015 5.5  5.0 - 8.0 Final  . Glucose, UA 07/24/2015 NEGATIVE  NEGATIVE mg/dL Final  . Hgb urine dipstick 07/24/2015 MODERATE* NEGATIVE Final  .  Bilirubin Urine 07/24/2015 NEGATIVE  NEGATIVE Final  . Ketones, ur 07/24/2015 NEGATIVE  NEGATIVE mg/dL Final  . Protein, ur 07/24/2015 NEGATIVE  NEGATIVE mg/dL Final  . Nitrite 07/24/2015 NEGATIVE  NEGATIVE Final  . Leukocytes, UA 07/24/2015 NEGATIVE  NEGATIVE Final  . MRSA, PCR 07/24/2015 NEGATIVE  NEGATIVE Final  . Staphylococcus aureus 07/24/2015 POSITIVE* NEGATIVE Final   Comment:        The Xpert SA Assay (FDA approved for NASAL specimens in patients over 51 years of age), is one component of a comprehensive surveillance program.  Test performance has been validated by [facility] for patients greater than or equal to 43 year old. It is not intended to diagnose infection nor to guide or monitor treatment.   . Squamous Epithelial / LPF 07/24/2015 NONE SEEN  NONE SEEN Final   Please note change in reference range.  . WBC, UA 07/24/2015 0-5  0 - 5 WBC/hpf Final   Please note change in reference range.  . RBC / HPF 07/24/2015 0-5  0 - 5 RBC/hpf Final   Please note change in reference range.  . Bacteria, UA 07/24/2015 RARE* NONE SEEN Final   Please note change in reference range.  Office Visit on 06/28/2015  Component Date Value Ref Range Status  . Date Time Interrogation Session 06/28/2015 [phone]   Final  . Pulse Generator Manufacturer 06/28/2015 MERM   Final  . Pulse Gen Model 06/28/2015 [id number] EnRhythm   Final  . Pulse Gen Serial Number 06/28/2015 [id number]   Final  . Implantable Pulse Generator Type 06/28/2015 Implantable Pulse Generator   Final  . Implantable Pulse Generator Implan* 06/28/2015 [id number]   Final  . Implantable Lead Manufacturer 06/28/2015 MERM   Final  . Implantable Lead Model 06/28/2015 5076 CapSureFix Novus   Final   . Implantable Lead Serial Number 06/28/2015 [id number]   Final  . Implantable Lead Implant Date 06/28/2015 [id number]   Final  . Implantable Lead Location 06/28/2015 [id number]   Final  . Implantable Lead Manufacturer 06/28/2015 MERM   Final  . Implantable Lead Model 06/28/2015 5076 CapSureFix Novus   Final  . Implantable Lead Serial Number 06/28/2015 [id number]   Final  . Implantable Lead Implant Date 06/28/2015 [id number]   Final  . Implantable Lead Location 06/28/2015 [id number]   Final  . Lead Channel Setting Sensing Sensi* 06/28/2015 0.9   Final  . Lead Channel Setting Pacing Pulse * 06/28/2015 0.4   Final  . Lead Channel Setting Pacing Amplit* 06/28/2015 2.5   Final  . Lead Channel Impedance Value 06/28/2015 368   Final  . Lead Channel Sensing Intrinsic Amp* 06/28/2015 0.565   Final  . Lead Channel Impedance Value 06/28/2015 496   Final  . Lead Channel Sensing Intrinsic Amp* 06/28/2015 5.519   Final  . Lead Channel Pacing Threshold Ampl* 06/28/2015 1.0   Final  . Lead Channel Pacing Threshold Puls* 06/28/2015 0.4   Final  . Battery Status 06/28/2015 OK   Final  . Battery Voltage 06/28/2015 2.9   Final  . Loletha Grayer Statistic RV Percent Paced 06/28/2015 98.33   Final  . Eval Rhythm 06/28/2015 AF/Vs 32-39   Final     X-Rays:No results found.  EKG: Orders placed or performed in visit on 05/16/15  . EKG 12-Lead     Hospital Course: Cathan A Burgert is a 74 y.o. who was admitted to [facility]. They were brought to the operating room on 07/31/2015 and underwent Procedure(s): RIGHT TOTAL KNEE ARTHROPLASTY.  Patient tolerated the procedure well and was later transferred to the recovery room and then to the orthopaedic floor for postoperative care.  They were given PO and IV analgesics for pain control following their surgery.  They were given 24 hours of postoperative antibiotics of  Anti-infectives    Start     Dose/Rate Route Frequency Ordered Stop   07/31/15 1530  ceFAZolin (ANCEF) IVPB 2  g/50 mL premix     2 g 100 mL/hr over 30 Minutes Intravenous Every 6 hours 07/31/15 1147 07/31/15 2254   07/31/15 0900  ceFAZolin (ANCEF) 3 g in dextrose 5 % 50 mL IVPB     3 g 160 mL/hr over 30 Minutes Intravenous  Once 07/31/15 0817 07/31/15 0845   07/30/15 1145  ceFAZolin (ANCEF) 3 g in dextrose 5 % 50 mL IVPB  Status:  Discontinued     3 g 160 mL/hr over 30 Minutes Intravenous On call to O.R. 07/30/15 1134 07/31/15 1150     and started on DVT prophylaxis in the form of Xarelto 10 mg. He was taking Xarelto 20 mg daily at home prior to surgery which was held preop.  PT and OT were ordered for total joint protocol.  Discharge planning consulted to help with postop disposition and equipment needs.  Patient had a very good night on the evening of surgery. He walked 130 feet the day of surgery.  They started to get up OOB with therapy again on day one. Hemovac drain was pulled without difficulty.  Dressing was checked and was clean and dry.  Patient was seen in rounds by Dr. Wynelle Link on day one and was ready to go home.  Discharge home with home health Diet - Cardiac diet Follow up - in 2 weeks on Tuesday 12/20 Activity - WBAT Disposition - Home Condition Upon Discharge - Good D/C Meds - See DC Summary DVT Prophylaxis - Xarelto  Discharge Instructions    Call MD / Call 911    Complete by:  As directed   If you experience chest pain or shortness of breath, CALL 911 and be transported to the hospital emergency room.  If you develope a fever above 101 F, pus (white drainage) or increased drainage or redness at the wound, or calf pain, call your surgeon's office.     Change dressing    Complete by:  As directed   Change dressing daily with sterile 4 x 4 inch gauze dressing and apply TED hose. Do not submerge the incision under water.     Constipation Prevention    Complete by:  As directed   Drink plenty of fluids.  Prune juice may be helpful.  You may use a stool softener, such as Colace (over  the counter) 100 mg twice a day.  Use MiraLax (over the counter) for constipation as needed.     Diet - low sodium heart healthy    Complete by:  As directed      Discharge instructions    Complete by:  As directed   Pick up stool softner and laxative for home use following surgery while on pain medications. Do not submerge incision under water. May remove the surgical dressing tomorrow, Wednesday 08/02/2015, and then apply a dry gauze dressing daily. Please use good hand washing techniques while changing dressing each day. May shower starting three days after surgery starting Thursday 08/03/2015. Please use a clean towel to pat the incision dry following showers. Continue to use ice for pain and swelling  after surgery. Do not use any lotions or creams on the incision until instructed by your surgeon.  Postoperative Constipation Protocol  Constipation - defined medically as fewer than three stools per week and severe constipation as less than one stool per week.  One of the most common issues patients have following surgery is constipation. Even if you have a regular bowel pattern at home, your normal regimen is likely to be disrupted due to multiple reasons following surgery. Combination of anesthesia, postoperative narcotics, change in appetite and fluid intake all can affect your bowels. In order to avoid complications following surgery, here are some recommendations in order to help you during your recovery period.  Colace (docusate) - Pick up an over-the-counter form of Colace or another stool softener and take twice a day as long as you are requiring postoperative pain medications. Take with a full glass of water daily. If you experience loose stools or diarrhea, hold the colace until you stool forms back up. If your symptoms do not get better within 1 week or if they get worse, check with your doctor.  Dulcolax (bisacodyl) - Pick up over-the-counter and take as directed by the  product packaging as needed to assist with the movement of your bowels. Take with a full glass of water. Use this product as needed if not relieved by Colace only.   MiraLax (polyethylene glycol) - Pick up over-the-counter to have on hand. MiraLax is a solution that will increase the amount of water in your bowels to assist with bowel movements. Take as directed and can mix with a glass of water, juice, soda, coffee, or tea. Take if you go more than two days without a movement. Do not use MiraLax more than once per day. Call your doctor if you are still constipated or irregular after using this medication for 7 days in a row.  If you continue to have problems with postoperative constipation, please contact the office for further assistance and recommendations. If you experience "the worst abdominal pain ever" or develop nausea or vomiting, please contact the office immediatly for further recommendations for treatment.  Resume the Xarelto 20 mg at home starting on Wednesday 08/02/2015.       Do not put a pillow under the knee. Place it under the heel.    Complete by:  As directed      Do not sit on low chairs, stoools or toilet seats, as it may be difficult to get up from low surfaces    Complete by:  As directed      Driving restrictions    Complete by:  As directed   No driving until released by the physician.     Increase activity slowly as tolerated    Complete by:  As directed      Lifting restrictions    Complete by:  As directed   No lifting until released by the physician.     Patient may shower    Complete by:  As directed   You may shower without a dressing once there is no drainage.  Do not wash over the wound.  If drainage remains, do not shower until drainage stops.     TED hose    Complete by:  As directed   Use stockings (TED hose) for 3 weeks on both leg(s).  You may remove them at night for sleeping.     Weight bearing as tolerated    Complete by:  As directed    Laterality:  right  Extremity:  Lower            Medication List    STOP taking these medications        amoxicillin 500 MG tablet  Commonly known as:  AMOXIL     multivitamin with minerals Tabs tablet     OVER THE COUNTER MEDICATION     oxyCODONE-acetaminophen 5-325 MG tablet  Commonly known as:  PERCOCET/ROXICET     Pumpkin Seed Oil Caps     SYSTANE OP      TAKE these medications        allopurinol 300 MG tablet  Commonly known as:  ZYLOPRIM  TAKE 1 TABLET BY MOUTH EVERY DAY     amLODipine 10 MG tablet  Commonly known as:  NORVASC  TAKE 1 TABLET BY MOUTH DAILY     benazepril 40 MG tablet  Commonly known as:  LOTENSIN  Take 0.5 tablets (20 mg total) by mouth 2 (two) times daily.     benazepril 40 MG tablet  Commonly known as:  LOTENSIN  TAKE 1 TABLET BY MOUTH TWICE DAILY     carvedilol 6.25 MG tablet  Commonly known as:  COREG  TAKE 1 TABLET BY MOUTH TWICE DAILY     colchicine 0.6 MG tablet  Take 1 tablet (0.6 mg total) by mouth daily as needed (gout).     doxazosin 8 MG tablet  Commonly known as:  CARDURA  Take 8 mg by mouth daily.     famciclovir 500 MG tablet  Commonly known as:  FAMVIR  Take 1,500 mg by mouth daily as needed (cold sores). As directed     finasteride 5 MG tablet  Commonly known as:  PROSCAR  Take 5 mg by mouth daily.     FLUoxetine 20 MG capsule  Commonly known as:  PROZAC  TAKE ONE CAPSULE BY MOUTH EVERY DAY     hydrochlorothiazide 25 MG tablet  Commonly known as:  HYDRODIURIL  Take 0.5 tablets (12.5 mg total) by mouth daily.     methocarbamol 500 MG tablet  Commonly known as:  ROBAXIN  Take 1 tablet (500 mg total) by mouth every 6 (six) hours as needed for muscle spasms.     oxybutynin 15 MG 24 hr tablet  Commonly known as:  DITROPAN XL  Take 15 mg by mouth at bedtime.     oxyCODONE 5 MG immediate release tablet  Commonly known as:  Oxy IR/ROXICODONE  Take 1-4 tablets (5-20 mg total) by mouth every 3 (three) hours  as needed for moderate pain or severe pain.     XARELTO 20 MG Tabs tablet  Generic drug:  rivaroxaban  TAKE 1 TABLET BY MOUTH DAILY WITH SUPPER     zolpidem 10 MG tablet  Commonly known as:  AMBIEN  Take 1 tablet (10 mg total) by mouth at bedtime as needed for sleep.           Follow-up Information    Follow up with Gearlean Alf, MD. Schedule an appointment as soon as possible for a visit on 08/15/2015.   Specialty:  Orthopedic Surgery   Why:  Call office at [phone] to setup appointment on Tuesday 08/15/2015 with Dr. Wynelle Link.   Contact information:   [address] [place] [address] [phone]       Signed: Arlee Muslim, PA-C Orthopaedic Surgery 08/01/2015, 8:15 AM

## 2015-08-01 NOTE — Progress Notes (Signed)
Physical Therapy Treatment Patient Details Name: [name] [name] MRN: [id number] DOB: 11-01-1940 Today's Date: 08/01/2015    History of Present Illness RTKA    PT Comments    Patient is progressing very well. Plans Dc this PM after PT/steps.  Follow Up Recommendations  Home health PT;Supervision/Assistance - 24 hour     Equipment Recommendations  None recommended by PT    Recommendations for Other Services       Precautions / Restrictions Precautions Precautions: Knee;Fall Knee Immobilizer - Right: Discontinue once straight leg raise with < 10 degree lag    Mobility  Bed Mobility Overal bed mobility: Needs Assistance Bed Mobility: Supine to Sit     Supine to sit: Min assist     General bed mobility comments: cues for technique  Transfers Overall transfer level: Needs assistance Equipment used: Rolling walker (2 wheeled) Transfers: Sit to/from Stand Sit to Stand: Supervision         General transfer comment: cues for technique  Ambulation/Gait Ambulation/Gait assistance: Supervision Ambulation Distance (Feet): 280 Feet Assistive device: Rolling walker (2 wheeled) Gait Pattern/deviations: Step-through pattern     General Gait Details: cues for sequence   Stairs            Wheelchair Mobility    Modified Rankin (Stroke Patients Only)       Balance                                    Cognition Arousal/Alertness: Awake/alert                          Exercises Total Joint Exercises Ankle Circles/Pumps: AROM;Left;Right;10 reps;Supine Quad Sets: AROM;Right;Left;10 reps;Supine Short Arc Quad: AROM Heel Slides: AROM;Right;10 reps;Supine Hip ABduction/ADduction: AROM;Right;10 reps;Supine Straight Leg Raises: AROM;Right;Supine Goniometric ROM: 10-70 knee flexion on R    General Comments        Pertinent Vitals/Pain Pain Assessment: No/denies pain    Home Living                      Prior Function             PT Goals (current goals can now be found in the care plan section) Progress towards PT goals: Progressing toward goals    Frequency  7X/week    PT Plan Current plan remains appropriate    Co-evaluation             End of Session Equipment Utilized During Treatment: Right knee immobilizer Activity Tolerance: Patient tolerated treatment well Patient left: in chair;with call bell/phone within reach;with family/visitor present     Time: 6073-7106 PT Time Calculation (min) (ACUTE ONLY): 34 min  Charges:  $Gait Training: 8-22 mins $Therapeutic Exercise: 8-22 mins                    G Codes:      Rada Hay 08/01/2015, 12:45 PM

## 2015-08-01 NOTE — Progress Notes (Signed)
Discharged from floor via w/c, wife & belongings with pt. No changes in assessment. [name] [name]  

## 2015-08-01 NOTE — Progress Notes (Signed)
  OT Cancellation Note  Patient Details Name: [name] [name] MRN: [id number] DOB: Sep 04, 1940   Cancelled Treatment:     Reason pt not seen: Orders received and chart reviewed. Pt has had previous TKA and states that he will have assistance PRN for ADL's at home & plans for d/c home today. Pt should benefit from 3:1., RN notified of this. Will sign off at this time. Thank you.  Mariam Dollar Beth Dixon, OTR/L 08/01/2015, 11:13 AM

## 2015-08-01 NOTE — Discharge Instructions (Addendum)
° °Dr. Frank Aluisio °Total Joint Specialist °[facility] °[address] °[place], [place] [address] °[phone] ° °TOTAL KNEE REPLACEMENT POSTOPERATIVE DIRECTIONS ° °Knee Rehabilitation, Guidelines Following Surgery  °Results after knee surgery are often greatly improved when you follow the exercise, range of motion and muscle strengthening exercises prescribed by your doctor. Safety measures are also important to protect the knee from further injury. Any time any of these exercises cause you to have increased pain or swelling in your knee joint, decrease the amount until you are comfortable again and slowly increase them. If you have problems or questions, call your caregiver or physical therapist for advice.  ° °HOME CARE INSTRUCTIONS  °Remove items at home which could result in a fall. This includes throw rugs or furniture in walking pathways.  °· ICE to the affected knee every three hours for 30 minutes at a time and then as needed for pain and swelling.  Continue to use ice on the knee for pain and swelling from surgery. You may notice swelling that will progress down to the foot and ankle.  This is normal after surgery.  Elevate the leg when you are not up walking on it.   °· Continue to use the breathing machine which will help keep your temperature down.  It is common for your temperature to cycle up and down following surgery, especially at night when you are not up moving around and exerting yourself.  The breathing machine keeps your lungs expanded and your temperature down. °· Do not place pillow under knee, focus on keeping the knee straight while resting ° °DIET °You may resume your previous home diet once your are discharged from the hospital. ° °DRESSING / WOUND CARE / SHOWERING °You may shower 3 days after surgery, but keep the wounds dry during showering.  You may use an occlusive plastic wrap (Press'n Seal for example), NO SOAKING/SUBMERGING IN THE BATHTUB.  If the  bandage gets wet, change with a clean dry gauze.  If the incision gets wet, pat the wound dry with a clean towel. °You may start showering once you are discharged home but do not submerge the incision under water. Just pat the incision dry and apply a dry gauze dressing on daily. °Change the surgical dressing daily and reapply a dry dressing each time. ° °ACTIVITY °Walk with your walker as instructed. °Use walker as long as suggested by your caregivers. °Avoid periods of inactivity such as sitting longer than an hour when not asleep. This helps prevent blood clots.  °You may resume a sexual relationship in one month or when given the OK by your doctor.  °You may return to work once you are cleared by your doctor.  °Do not drive a car for 6 weeks or until released by you surgeon.  °Do not drive while taking narcotics. ° °WEIGHT BEARING °Weight bearing as tolerated with assist device (walker, cane, etc) as directed, use it as long as suggested by your surgeon or therapist, typically at least 4-6 weeks. ° °POSTOPERATIVE CONSTIPATION PROTOCOL °Constipation - defined medically as fewer than three stools per week and severe constipation as less than one stool per week. ° °One of the most common issues patients have following surgery is constipation.  Even if you have a regular bowel pattern at home, your normal regimen is likely to be disrupted due to multiple reasons following surgery.  Combination of anesthesia, postoperative narcotics, change in appetite and fluid intake all can affect your bowels.    In order to avoid complications following surgery, here are some recommendations in order to help you during your recovery period. ° °Colace (docusate) - Pick up an over-the-counter form of Colace or another stool softener and take twice a day as long as you are requiring postoperative pain medications.  Take with a full glass of water daily.  If you experience loose stools or diarrhea, hold the colace until you stool forms  back up.  If your symptoms do not get better within 1 week or if they get worse, check with your doctor. ° °Dulcolax (bisacodyl) - Pick up over-the-counter and take as directed by the product packaging as needed to assist with the movement of your bowels.  Take with a full glass of water.  Use this product as needed if not relieved by Colace only.  ° °MiraLax (polyethylene glycol) - Pick up over-the-counter to have on hand.  MiraLax is a solution that will increase the amount of water in your bowels to assist with bowel movements.  Take as directed and can mix with a glass of water, juice, soda, coffee, or tea.  Take if you go more than two days without a movement. °Do not use MiraLax more than once per day. Call your doctor if you are still constipated or irregular after using this medication for 7 days in a row. ° °If you continue to have problems with postoperative constipation, please contact the office for further assistance and recommendations.  If you experience "the worst abdominal pain ever" or develop nausea or vomiting, please contact the office immediatly for further recommendations for treatment. ° °ITCHING ° If you experience itching with your medications, try taking only a single pain pill, or even half a pain pill at a time.  You can also use Benadryl over the counter for itching or also to help with sleep.  ° °TED HOSE STOCKINGS °Wear the elastic stockings on both legs for three weeks following surgery during the day but you may remove then at night for sleeping. ° °MEDICATIONS °See your medication summary on the “After Visit Summary” that the nursing staff will review with you prior to discharge.  You may have some home medications which will be placed on hold until you complete the course of blood thinner medication.  It is important for you to complete the blood thinner medication as prescribed by your surgeon.  Continue your approved medications as instructed at time of  discharge. ° °PRECAUTIONS °If you experience chest pain or shortness of breath - call 911 immediately for transfer to the hospital emergency department.  °If you develop a fever greater that 101 F, purulent drainage from wound, increased redness or drainage from wound, foul odor from the wound/dressing, or calf pain - CONTACT YOUR SURGEON.   °                                                °FOLLOW-UP APPOINTMENTS °Make sure you keep all of your appointments after your operation with your surgeon and caregivers. You should call the office at the above phone number and make an appointment for approximately two weeks after the date of your surgery or on the date instructed by your surgeon outlined in the "After Visit Summary". ° ° °RANGE OF MOTION AND STRENGTHENING EXERCISES  °Rehabilitation of the knee is important following a knee injury or   an operation. After just a few days of immobilization, the muscles of the thigh which control the knee become weakened and shrink (atrophy). Knee exercises are designed to build up the tone and strength of the thigh muscles and to improve knee motion. Often times heat used for twenty to thirty minutes before working out will loosen up your tissues and help with improving the range of motion but do not use heat for the first two weeks following surgery. These exercises can be done on a training (exercise) mat, on the floor, on a table or on a bed. Use what ever works the best and is most comfortable for you Knee exercises include:  Leg Lifts - While your knee is still immobilized in a splint or cast, you can do straight leg raises. Lift the leg to 60 degrees, hold for 3 sec, and slowly lower the leg. Repeat 10-20 times 2-3 times daily. Perform this exercise against resistance later as your knee gets better.  Quad and Hamstring Sets - Tighten up the muscle on the front of the thigh (Quad) and hold for 5-10 sec. Repeat this 10-20 times hourly. Hamstring sets are done by pushing the  foot backward against an object and holding for 5-10 sec. Repeat as with quad sets.   Leg Slides: Lying on your back, slowly slide your foot toward your buttocks, bending your knee up off the floor (only go as far as is comfortable). Then slowly slide your foot back down until your leg is flat on the floor again.  Angel Wings: Lying on your back spread your legs to the side as far apart as you can without causing discomfort.  A rehabilitation program following serious knee injuries can speed recovery and prevent re-injury in the future due to weakened muscles. Contact your doctor or a physical therapist for more information on knee rehabilitation.   IF YOU ARE TRANSFERRED TO A SKILLED REHAB FACILITY If the patient is transferred to a skilled rehab facility following release from the hospital, a list of the current medications will be sent to the facility for the patient to continue.  When discharged from the skilled rehab facility, please have the facility set up the patient's Home Health Physical Therapy prior to being released. Also, the skilled facility will be responsible for providing the patient with their medications at time of release from the facility to include their pain medication, the muscle relaxants, and their blood thinner medication. If the patient is still at the rehab facility at time of the two week follow up appointment, the skilled rehab facility will also need to assist the patient in arranging follow up appointment in our office and any transportation needs.  MAKE SURE YOU:  Understand these instructions.  Get help right away if you are not doing well or get worse.    Pick up stool softner and laxative for home use following surgery while on pain medications. Do not submerge incision under water. Please use good hand washing techniques while changing dressing each day. May shower starting three days after surgery. Please use a clean towel to pat the incision dry following  showers. Continue to use ice for pain and swelling after surgery. Do not use any lotions or creams on the incision until instructed by your surgeon.  Resume the Xarelto 20 mg at home starting on Wednesday 08/02/2015.

## 2015-08-02 DIAGNOSIS — I129 Hypertensive chronic kidney disease with stage 1 through stage 4 chronic kidney disease, or unspecified chronic kidney disease: Secondary | ICD-10-CM | POA: Diagnosis not present

## 2015-08-02 DIAGNOSIS — Z6841 Body Mass Index (BMI) 40.0 and over, adult: Secondary | ICD-10-CM | POA: Diagnosis not present

## 2015-08-02 DIAGNOSIS — I481 Persistent atrial fibrillation: Secondary | ICD-10-CM | POA: Diagnosis not present

## 2015-08-02 DIAGNOSIS — Z471 Aftercare following joint replacement surgery: Secondary | ICD-10-CM | POA: Diagnosis not present

## 2015-08-02 DIAGNOSIS — F419 Anxiety disorder, unspecified: Secondary | ICD-10-CM | POA: Diagnosis not present

## 2015-08-02 DIAGNOSIS — E785 Hyperlipidemia, unspecified: Secondary | ICD-10-CM | POA: Diagnosis not present

## 2015-08-02 DIAGNOSIS — Z7901 Long term (current) use of anticoagulants: Secondary | ICD-10-CM | POA: Diagnosis not present

## 2015-08-02 DIAGNOSIS — Z950 Presence of cardiac pacemaker: Secondary | ICD-10-CM | POA: Diagnosis not present

## 2015-08-02 DIAGNOSIS — N189 Chronic kidney disease, unspecified: Secondary | ICD-10-CM | POA: Diagnosis not present

## 2015-08-02 DIAGNOSIS — I2510 Atherosclerotic heart disease of native coronary artery without angina pectoris: Secondary | ICD-10-CM | POA: Diagnosis not present

## 2015-08-02 DIAGNOSIS — Z79891 Long term (current) use of opiate analgesic: Secondary | ICD-10-CM | POA: Diagnosis not present

## 2015-08-02 DIAGNOSIS — Z96653 Presence of artificial knee joint, bilateral: Secondary | ICD-10-CM | POA: Diagnosis not present

## 2015-08-03 ENCOUNTER — Other Ambulatory Visit: Payer: Self-pay | Admitting: Family Medicine

## 2015-08-03 DIAGNOSIS — E785 Hyperlipidemia, unspecified: Secondary | ICD-10-CM | POA: Diagnosis not present

## 2015-08-03 DIAGNOSIS — I10 Essential (primary) hypertension: Secondary | ICD-10-CM

## 2015-08-03 DIAGNOSIS — I481 Persistent atrial fibrillation: Secondary | ICD-10-CM | POA: Diagnosis not present

## 2015-08-03 DIAGNOSIS — Z7901 Long term (current) use of anticoagulants: Secondary | ICD-10-CM | POA: Diagnosis not present

## 2015-08-03 DIAGNOSIS — Z950 Presence of cardiac pacemaker: Secondary | ICD-10-CM | POA: Diagnosis not present

## 2015-08-03 DIAGNOSIS — Z79891 Long term (current) use of opiate analgesic: Secondary | ICD-10-CM | POA: Diagnosis not present

## 2015-08-03 DIAGNOSIS — N189 Chronic kidney disease, unspecified: Secondary | ICD-10-CM | POA: Diagnosis not present

## 2015-08-03 DIAGNOSIS — I129 Hypertensive chronic kidney disease with stage 1 through stage 4 chronic kidney disease, or unspecified chronic kidney disease: Secondary | ICD-10-CM | POA: Diagnosis not present

## 2015-08-03 DIAGNOSIS — Z471 Aftercare following joint replacement surgery: Secondary | ICD-10-CM | POA: Diagnosis not present

## 2015-08-03 DIAGNOSIS — I2510 Atherosclerotic heart disease of native coronary artery without angina pectoris: Secondary | ICD-10-CM | POA: Diagnosis not present

## 2015-08-03 DIAGNOSIS — F419 Anxiety disorder, unspecified: Secondary | ICD-10-CM | POA: Diagnosis not present

## 2015-08-03 DIAGNOSIS — M109 Gout, unspecified: Secondary | ICD-10-CM

## 2015-08-03 DIAGNOSIS — R739 Hyperglycemia, unspecified: Secondary | ICD-10-CM

## 2015-08-03 DIAGNOSIS — Z6841 Body Mass Index (BMI) 40.0 and over, adult: Secondary | ICD-10-CM | POA: Diagnosis not present

## 2015-08-03 DIAGNOSIS — Z96653 Presence of artificial knee joint, bilateral: Secondary | ICD-10-CM | POA: Diagnosis not present

## 2015-08-07 DIAGNOSIS — I2510 Atherosclerotic heart disease of native coronary artery without angina pectoris: Secondary | ICD-10-CM | POA: Diagnosis not present

## 2015-08-07 DIAGNOSIS — Z79891 Long term (current) use of opiate analgesic: Secondary | ICD-10-CM | POA: Diagnosis not present

## 2015-08-07 DIAGNOSIS — Z471 Aftercare following joint replacement surgery: Secondary | ICD-10-CM | POA: Diagnosis not present

## 2015-08-07 DIAGNOSIS — N189 Chronic kidney disease, unspecified: Secondary | ICD-10-CM | POA: Diagnosis not present

## 2015-08-07 DIAGNOSIS — Z6841 Body Mass Index (BMI) 40.0 and over, adult: Secondary | ICD-10-CM | POA: Diagnosis not present

## 2015-08-07 DIAGNOSIS — E785 Hyperlipidemia, unspecified: Secondary | ICD-10-CM | POA: Diagnosis not present

## 2015-08-07 DIAGNOSIS — F419 Anxiety disorder, unspecified: Secondary | ICD-10-CM | POA: Diagnosis not present

## 2015-08-07 DIAGNOSIS — Z96653 Presence of artificial knee joint, bilateral: Secondary | ICD-10-CM | POA: Diagnosis not present

## 2015-08-07 DIAGNOSIS — I129 Hypertensive chronic kidney disease with stage 1 through stage 4 chronic kidney disease, or unspecified chronic kidney disease: Secondary | ICD-10-CM | POA: Diagnosis not present

## 2015-08-07 DIAGNOSIS — I481 Persistent atrial fibrillation: Secondary | ICD-10-CM | POA: Diagnosis not present

## 2015-08-07 DIAGNOSIS — Z950 Presence of cardiac pacemaker: Secondary | ICD-10-CM | POA: Diagnosis not present

## 2015-08-07 DIAGNOSIS — Z7901 Long term (current) use of anticoagulants: Secondary | ICD-10-CM | POA: Diagnosis not present

## 2015-08-08 ENCOUNTER — Other Ambulatory Visit (INDEPENDENT_AMBULATORY_CARE_PROVIDER_SITE_OTHER): Payer: Commercial Managed Care - PPO

## 2015-08-08 DIAGNOSIS — R739 Hyperglycemia, unspecified: Secondary | ICD-10-CM | POA: Diagnosis not present

## 2015-08-08 DIAGNOSIS — I10 Essential (primary) hypertension: Secondary | ICD-10-CM | POA: Diagnosis not present

## 2015-08-08 DIAGNOSIS — M109 Gout, unspecified: Secondary | ICD-10-CM | POA: Diagnosis not present

## 2015-08-08 LAB — LIPID PANEL
CHOL/HDL RATIO: 4
Cholesterol: 98 mg/dL (ref 0–200)
HDL: 25.7 mg/dL — AB (ref >39.00)
LDL CALC: 55 mg/dL (ref 0–99)
NONHDL: 72.06
Triglycerides: 85 mg/dL (ref 0.0–149.0)
VLDL: 17 mg/dL (ref 0.0–40.0)

## 2015-08-08 LAB — HEMOGLOBIN A1C: Hgb A1c MFr Bld: 5.7 % (ref 4.6–6.5)

## 2015-08-08 LAB — URIC ACID: Uric Acid, Serum: 4.7 mg/dL (ref 4.0–7.8)

## 2015-08-08 LAB — TSH: TSH: 2.08 u[IU]/mL (ref 0.35–4.50)

## 2015-08-10 ENCOUNTER — Other Ambulatory Visit: Payer: Self-pay

## 2015-08-11 DIAGNOSIS — I2510 Atherosclerotic heart disease of native coronary artery without angina pectoris: Secondary | ICD-10-CM | POA: Diagnosis not present

## 2015-08-11 DIAGNOSIS — N189 Chronic kidney disease, unspecified: Secondary | ICD-10-CM | POA: Diagnosis not present

## 2015-08-11 DIAGNOSIS — Z79891 Long term (current) use of opiate analgesic: Secondary | ICD-10-CM | POA: Diagnosis not present

## 2015-08-11 DIAGNOSIS — I481 Persistent atrial fibrillation: Secondary | ICD-10-CM | POA: Diagnosis not present

## 2015-08-11 DIAGNOSIS — Z96653 Presence of artificial knee joint, bilateral: Secondary | ICD-10-CM | POA: Diagnosis not present

## 2015-08-11 DIAGNOSIS — F419 Anxiety disorder, unspecified: Secondary | ICD-10-CM | POA: Diagnosis not present

## 2015-08-11 DIAGNOSIS — Z6841 Body Mass Index (BMI) 40.0 and over, adult: Secondary | ICD-10-CM | POA: Diagnosis not present

## 2015-08-11 DIAGNOSIS — Z7901 Long term (current) use of anticoagulants: Secondary | ICD-10-CM | POA: Diagnosis not present

## 2015-08-11 DIAGNOSIS — I129 Hypertensive chronic kidney disease with stage 1 through stage 4 chronic kidney disease, or unspecified chronic kidney disease: Secondary | ICD-10-CM | POA: Diagnosis not present

## 2015-08-11 DIAGNOSIS — E785 Hyperlipidemia, unspecified: Secondary | ICD-10-CM | POA: Diagnosis not present

## 2015-08-11 DIAGNOSIS — Z471 Aftercare following joint replacement surgery: Secondary | ICD-10-CM | POA: Diagnosis not present

## 2015-08-11 DIAGNOSIS — Z950 Presence of cardiac pacemaker: Secondary | ICD-10-CM | POA: Diagnosis not present

## 2015-08-14 ENCOUNTER — Ambulatory Visit: Payer: Self-pay | Admitting: Family Medicine

## 2015-08-14 ENCOUNTER — Encounter: Payer: Self-pay | Admitting: Family Medicine

## 2015-08-14 ENCOUNTER — Ambulatory Visit (INDEPENDENT_AMBULATORY_CARE_PROVIDER_SITE_OTHER): Payer: Commercial Managed Care - PPO | Admitting: Family Medicine

## 2015-08-14 VITALS — BP 130/60 | HR 85 | Temp 98.3°F | Wt 273.5 lb

## 2015-08-14 DIAGNOSIS — R739 Hyperglycemia, unspecified: Secondary | ICD-10-CM

## 2015-08-14 MED ORDER — BENAZEPRIL HCL 40 MG PO TABS: 20.0000 mg | ORAL_TABLET | Freq: Every day | ORAL | Status: AC

## 2015-08-14 NOTE — Progress Notes (Signed)
Pre visit review using our clinic review tool, if applicable. No additional management support is needed unless otherwise documented below in the visit note.  Had his R knee surgery and is working with ortho re: pain meds.  Has PT pending for today.    H/o hyperglycemia.  He doesn't have DM2 by last A1c.  D/w pt.  No meds for DM2 currently.   PSA is per uro.  D/w pt.    PMH and SH reviewed  Meds, vitals, and allergies reviewed.   ROS: See HPI.  Otherwise negative.    GEN: nad, alert and oriented HEENT: mucous membranes moist NECK: supple w/o LA CV: IRR, not tachy PULM: ctab, no inc wob ABD: soft, +bs SKIN: no acute rash R knee sutured, appears to be healing well.  No spreading erythema.

## 2015-08-14 NOTE — Patient Instructions (Addendum)
Take care. Glad to see you.  Keep working with PT but go easy otherwise.

## 2015-08-16 NOTE — Assessment & Plan Note (Signed)
Doing well post op.  Continue as is.  No meds for DM2.  A1c improved.

## 2015-08-29 ENCOUNTER — Telehealth: Payer: Self-pay | Admitting: Nurse Practitioner

## 2015-08-29 NOTE — Telephone Encounter (Signed)
Called patient to discuss Watchman as per Dr Jenel Lucks office visit 06/2015.  He is not where he has a good cell signal and will call back tomorrow. Please route phone message to me when he calls back.   Gypsy Balsam, NP 08/29/2015 12:53 PM

## 2015-08-30 NOTE — Telephone Encounter (Signed)
Spoke with patient and discussed Watchman. He would like to discuss further with his wife. Will call back in a couple of weeks to follow up  Gypsy Balsam, NP 08/30/2015 4:03 PM

## 2015-09-06 ENCOUNTER — Encounter: Payer: Self-pay | Admitting: Family Medicine

## 2015-09-06 ENCOUNTER — Ambulatory Visit: Payer: Self-pay | Admitting: Family Medicine

## 2015-09-06 ENCOUNTER — Ambulatory Visit (INDEPENDENT_AMBULATORY_CARE_PROVIDER_SITE_OTHER): Payer: Commercial Managed Care - PPO | Admitting: Family Medicine

## 2015-09-06 VITALS — BP 106/60 | HR 68 | Temp 98.7°F | Wt 272.2 lb

## 2015-09-06 DIAGNOSIS — I10 Essential (primary) hypertension: Secondary | ICD-10-CM

## 2015-09-06 DIAGNOSIS — H8110 Benign paroxysmal vertigo, unspecified ear: Secondary | ICD-10-CM | POA: Diagnosis not present

## 2015-09-06 MED ORDER — OXYCODONE HCL 5 MG PO TABS: 5.0000 mg | ORAL_TABLET | ORAL | Status: DC | PRN

## 2015-09-06 NOTE — Patient Instructions (Signed)
Stop the HCTZ.  Let me know if your BP stays low.  Use the bedside exercises (tilting back and forth) and see if that helps the vertigo.  Take care.  Glad to see you.

## 2015-09-06 NOTE — Progress Notes (Signed)
Pre visit review using our clinic review tool, if applicable. No additional management support is needed unless otherwise documented below in the visit note.  Started about 5 days.  Room spinning sensation.  He'll have sx when rolling over in bed, quick onset, then resolves quickly.  Sx if bending over.  Not lightheaded.  Sx are not all the time.  Some soft ear ringing/rushing sound, long standing.    His BP is low for him.  He is compliant with med.  In less pain with his prev knee surgery.  Has lost weight from prev.  Not syncopal.   Meds, vitals, and allergies reviewed.   ROS: See HPI.  Otherwise, noncontributory.  nad ncat Tm wnl Nasal and OP wnl  Neck supple no LA rrr ctab CN 2-12 wnl B, S/S grossly wnl x4 No sx on head ROM at the OV

## 2015-09-07 DIAGNOSIS — H8110 Benign paroxysmal vertigo, unspecified ear: Secondary | ICD-10-CM | POA: Insufficient documentation

## 2015-09-07 NOTE — Assessment & Plan Note (Signed)
Likely dx, given sx rolling over in the bed.   D/w pt.  Would avoid antihistamines but would use bedside exercises.  He agrees.  Update me as needed.  Path/phys dw pt.

## 2015-09-07 NOTE — Assessment & Plan Note (Signed)
Likely incidentally overtreated.  Stop HCTZ and he'll update me as needed.  He agrees.

## 2015-09-14 ENCOUNTER — Telehealth: Payer: Self-pay | Admitting: Nurse Practitioner

## 2015-09-14 NOTE — Telephone Encounter (Signed)
Spoke with patient again regarding Watchman. Hematuria is improved post surgery off of NSAIDS.  He would like to continue Xarelto and hold off on Watchman for now which I think is reasonable. He will call the office with recurrent hematuria/bleeding issues if he would like to pursue workup.    Gypsy Balsam, NP 09/14/2015 11:31 AM

## 2015-09-17 ENCOUNTER — Encounter: Payer: Self-pay | Admitting: Nurse Practitioner

## 2015-09-17 ENCOUNTER — Other Ambulatory Visit: Payer: Self-pay | Admitting: Family Medicine

## 2015-09-17 ENCOUNTER — Other Ambulatory Visit: Payer: Self-pay | Admitting: Internal Medicine

## 2015-09-18 ENCOUNTER — Other Ambulatory Visit: Payer: Self-pay | Admitting: *Deleted

## 2015-09-18 MED ORDER — RIVAROXABAN 20 MG PO TABS: 20.0000 mg | ORAL_TABLET | Freq: Every day | ORAL | Status: AC

## 2015-09-20 ENCOUNTER — Encounter: Payer: Self-pay | Admitting: Nurse Practitioner

## 2015-09-27 ENCOUNTER — Telehealth: Payer: Self-pay | Admitting: Cardiology

## 2015-09-27 ENCOUNTER — Ambulatory Visit (INDEPENDENT_AMBULATORY_CARE_PROVIDER_SITE_OTHER): Payer: Commercial Managed Care - PPO | Admitting: *Deleted

## 2015-09-27 DIAGNOSIS — R001 Bradycardia, unspecified: Secondary | ICD-10-CM

## 2015-09-27 NOTE — Telephone Encounter (Signed)
Spoke with pt and reminded pt of remote transmission that is due today. Pt verbalized understanding.   

## 2015-09-28 NOTE — Progress Notes (Signed)
Remote pacemaker transmission.   

## 2015-10-10 ENCOUNTER — Encounter: Payer: Self-pay | Admitting: Cardiology

## 2015-10-10 LAB — CUP PACEART REMOTE DEVICE CHECK
Implantable Lead Implant Date: 20110304
Implantable Lead Location: 753859
Implantable Lead Model: 5076
Lead Channel Impedance Value: 368 Ohm
Lead Channel Setting Pacing Amplitude: 2.5 V
Lead Channel Setting Pacing Pulse Width: 0.4 ms
Lead Channel Setting Sensing Sensitivity: 0.9 mV
MDC IDC LEAD IMPLANT DT: 20110304
MDC IDC LEAD LOCATION: 753860
MDC IDC MSMT BATTERY VOLTAGE: 2.89 V
MDC IDC MSMT LEADCHNL RV IMPEDANCE VALUE: 488 Ohm
MDC IDC SESS DTM: 20170201235839
MDC IDC STAT BRADY RV PERCENT PACED: 96.66 %

## 2015-10-16 ENCOUNTER — Other Ambulatory Visit: Payer: Self-pay | Admitting: Family Medicine

## 2015-10-16 NOTE — Telephone Encounter (Signed)
Please call in.  Thanks.   

## 2015-10-16 NOTE — Telephone Encounter (Signed)
Electronic refill request. Last Filled:    30 tablet 1 07/04/2015  Last office visit:   09/06/15  Please advise.

## 2015-10-17 NOTE — Telephone Encounter (Signed)
Medication phoned to pharmacy.  

## 2015-10-19 ENCOUNTER — Encounter: Payer: Self-pay | Admitting: Family Medicine

## 2015-10-25 ENCOUNTER — Ambulatory Visit (INDEPENDENT_AMBULATORY_CARE_PROVIDER_SITE_OTHER): Payer: Commercial Managed Care - PPO | Admitting: Pulmonary Disease

## 2015-10-25 ENCOUNTER — Ambulatory Visit: Payer: 59 | Admitting: Pulmonary Disease

## 2015-10-25 ENCOUNTER — Encounter: Payer: Self-pay | Admitting: Pulmonary Disease

## 2015-10-25 VITALS — BP 126/82 | HR 69 | Ht 72.0 in | Wt 281.8 lb

## 2015-10-25 DIAGNOSIS — G4733 Obstructive sleep apnea (adult) (pediatric): Secondary | ICD-10-CM | POA: Diagnosis not present

## 2015-10-25 NOTE — Progress Notes (Signed)
Current Outpatient Prescriptions on File Prior to Visit  Medication Sig  . alfuzosin (UROXATRAL) 10 MG 24 hr tablet Take 10 mg by mouth daily with breakfast.  . allopurinol (ZYLOPRIM) 300 MG tablet TAKE 1 TABLET BY MOUTH EVERY DAY  . amLODipine (NORVASC) 10 MG tablet TAKE 1 TABLET BY MOUTH DAILY (Patient taking differently: TAKE 1/2 TABLET BY MOUTH DAILY)  . benazepril (LOTENSIN) 40 MG tablet Take 0.5 tablets (20 mg total) by mouth daily.  . carvedilol (COREG) 6.25 MG tablet TAKE 1 TABLET BY MOUTH TWICE DAILY  . colchicine 0.6 MG tablet Take 1 tablet (0.6 mg total) by mouth daily as needed (gout).  Marland Kitchen doxazosin (CARDURA) 8 MG tablet Take 8 mg by mouth daily.   . famciclovir (FAMVIR) 500 MG tablet Take 1,500 mg by mouth daily as needed (cold sores). As directed  . finasteride (PROSCAR) 5 MG tablet Take 5 mg by mouth daily.    Marland Kitchen FLUoxetine (PROZAC) 20 MG capsule TAKE 1 CAPSULE BY MOUTH EVERY DAY  . Misc Natural Products (PROSTATE) CAPS Take by mouth daily.  . Misc Natural Products (PUMPKIN SEED OIL) CAPS Take by mouth daily.  Marland Kitchen oxybutynin (DITROPAN XL) 15 MG 24 hr tablet Take 7.5 mg by mouth at bedtime.   Marland Kitchen oxyCODONE (OXY IR/ROXICODONE) 5 MG immediate release tablet Take 1-2 tablets (5-10 mg total) by mouth every 4 (four) hours as needed for severe pain (for gout).  . rivaroxaban (XARELTO) 20 MG TABS tablet Take 1 tablet (20 mg total) by mouth daily with supper.  . zolpidem (AMBIEN) 10 MG tablet TAKE 1 TABLET BY MOUTH AT BEDTIME AS NEEDED   No current facility-administered medications on file prior to visit.     Chief Complaint  Patient presents with  . Follow-up    DME: [facility], Wears CPAP nightly x 7-8hrs. Denies problems with mask or pressure.      Tests PSG 09/02/02 >> AHI 110 Echo 05/28/13 >> mod LVH, EF 45 to 50%, mod LA dilation Auto CPAP 09/24/15 to 10/23/15 >> used on 25 of 30 nights with average 8 hrs and 14 min.  Average AHI is 4.9 with median CPAP 12 cm H2O and 95 th percentile  CPAP 14 cm H20.   Past medical hx CAD, HTN, A fib, HLD, ETOH, BPH, Anxiety, GERD, HH, Gout, Nephrolithiasis  Past surgical hx, Allergies, Family hx, Social hx all reviewed.  Vital Signs BP 126/82 mmHg  Pulse 69  Ht 6' (1.829 m)  Wt 281 lb 12.8 oz (127.824 kg)  BMI 38.21 kg/m2  SpO2 95%  History of Present Illness [name] [name] is a 75 y.o. male with OSA.  He uses CPAP every night.  He has several machines >> he uses newest one at home, and spare ones when he travels.  He has nasal mask >> no issues with mask fit.  He gets 8 hrs sleep.  Can't sleep w/o CPAP.  He uses 5 mg ambien about 2 or 3 times per week.   Physical Exam  General - No distress ENT - No sinus tenderness, no oral exudate, no LAN, MP 4, enlarged tongue Cardiac - s1s2 regular, no murmur Chest - No wheeze/rales/dullness Back - No focal tenderness Abd - Soft, non-tender Ext - No edema Neuro - Normal strength Skin - No rashes Psych - normal mood, and behavior   Assessment/Plan  Obstructive sleep apnea. Plan: - continue auto CPAP  Insomnia. Plan: - continue prn ambien  Obesity. Plan: - discussed importance of weight  loss    Patient Instructions  Follow up in 1 year     Coralyn Helling, MD [name] Pager:  [phone]

## 2015-10-25 NOTE — Patient Instructions (Signed)
Follow up in 1 year.

## 2015-10-27 ENCOUNTER — Telehealth: Payer: Self-pay | Admitting: Cardiology

## 2015-10-27 ENCOUNTER — Encounter: Payer: Commercial Managed Care - PPO | Admitting: *Deleted

## 2015-10-27 NOTE — Telephone Encounter (Signed)
Spoke with pt and reminded pt of remote transmission that is due today. Pt verbalized understanding.   

## 2015-10-29 ENCOUNTER — Encounter: Payer: Self-pay | Admitting: Cardiology

## 2015-11-10 ENCOUNTER — Ambulatory Visit: Payer: Self-pay | Admitting: Nurse Practitioner

## 2015-11-12 ENCOUNTER — Other Ambulatory Visit: Payer: Self-pay | Admitting: Internal Medicine

## 2015-11-20 ENCOUNTER — Other Ambulatory Visit: Payer: Self-pay | Admitting: Family Medicine

## 2015-11-20 NOTE — Telephone Encounter (Signed)
Call pt.  His wife mentioned this at her OV today, incidentally.  He had been off HCTZ but was having some swelling.  Okay to restart HCTZ 12.5mg  a day (1/2 of 25mg ) as needed for swelling.  Update me as needed.  This med could raise the chance of a gout flare, but at a low dose the chance is low.  I added the med, please send in if he consents.   Thanks.

## 2015-11-20 NOTE — Telephone Encounter (Signed)
Left detailed message on voicemail to return call if patient consents to restart medication.

## 2015-11-21 MED ORDER — HYDROCHLOROTHIAZIDE 25 MG PO TABS: 12.5000 mg | ORAL_TABLET | Freq: Every day | ORAL | Status: DC | PRN

## 2015-11-21 NOTE — Telephone Encounter (Signed)
Rx sent 

## 2015-11-21 NOTE — Telephone Encounter (Signed)
Pt called back he does need this rx

## 2015-12-05 ENCOUNTER — Encounter: Payer: Self-pay | Admitting: Nurse Practitioner

## 2015-12-05 ENCOUNTER — Ambulatory Visit (INDEPENDENT_AMBULATORY_CARE_PROVIDER_SITE_OTHER): Payer: Commercial Managed Care - PPO | Admitting: Nurse Practitioner

## 2015-12-05 VITALS — BP 140/80 | HR 60 | Ht 72.0 in | Wt 275.8 lb

## 2015-12-05 DIAGNOSIS — I429 Cardiomyopathy, unspecified: Secondary | ICD-10-CM | POA: Diagnosis not present

## 2015-12-05 DIAGNOSIS — I428 Other cardiomyopathies: Secondary | ICD-10-CM

## 2015-12-05 DIAGNOSIS — I482 Chronic atrial fibrillation, unspecified: Secondary | ICD-10-CM

## 2015-12-05 DIAGNOSIS — I10 Essential (primary) hypertension: Secondary | ICD-10-CM | POA: Diagnosis not present

## 2015-12-05 NOTE — Patient Instructions (Addendum)
We will be checking the following labs today -  NONE   Medication Instructions:    Continue with your current medicines.     Testing/Procedures To Be Arranged:  N/A  Follow-Up:   See Dr. Johney Frame as planned in December.     Other Special Instructions:   N/A    If you need a refill on your cardiac medications before your next appointment, please call your pharmacy.   Call the [facility] office at [phone] if you have any questions, problems or concerns.

## 2015-12-05 NOTE — Progress Notes (Signed)
CARDIOLOGY OFFICE NOTE  Date:  12/05/2015    [name] Date of Birth: [birth year] Medical Record #[id number]  PCP:  Crawford Givens, MD  Cardiologist:  Allred    Chief Complaint  Patient presents with  . Coronary Artery Disease  . Atrial Fibrillation  . Hypertension  . Hyperlipidemia    Follow up visit - seen for Dr. Johney Frame    History of Present Illness: Ashad A Stegman is a 75 y.o. male who presents today for a follow up visit. Seen for Dr. Johney Frame. Former patient of Dr. Ronnald Nian that I know well.   He has multiple issues which include CAD with remote PCI in 2003, chronic atrial fib, HTN, ETOH abuse, OSA on CPAP, obesity, HLD, GERD, SSS with PPM, past atrial flutter ablation, gout, and a hiatal hernia. Other issues as noted below.   Seen in September of 2014 by Dr. Johney Frame - complaining of exertional DOE - Lexiscan ordered - inferior scar with possible peri-infarct ischemia which was new compared to prior Myoview per Dr. Deborah Chalk - was recathed - continued with medical management.   Switched to Xarelto in October of 2014. Coreg increased for BP. Encouraged to lose weight which has always been a chronic issue. [name] continued to opt for rate control in regards to his AF.  Seen in March of 2016 - weight was up - drinking more beer - having at least 2 to 3 per day - we looked up the calories - about 100 calories per can - so about every 2 weeks he was gaining a pound. At my last visit he was actively losing weight and had to have antihypertensives cut back due to low BP.   Last seen by Dr. Johney Frame back in November - had had issues with hematuria. Discussed WATCHMAN. He is on a list for consideration. Needed to be off NSAID.   Comes back today. Here alone. He is doing well. He has had his knee surgery - did great. No chest pain. Breathing is good. He is back on track with losing weight. Still has some microscopic hematuria - followed by Dr. Annabell Howells. Has had medicines cut back by PCP  for lower BP. BP is typically in the 130's at home and he follows closely. Seeing PCP for his labs and check up in June. He is happy with how he is doing.  Past Medical History  Diagnosis Date  . CAD (coronary artery disease)     s/p PCI 2003. Last stress test in Feb 2011 was normal  . Persistent atrial fibrillation (HCC)     on Multaq and Pradaxa  . HTN (hypertension)   . ETOH abuse   . DJD (degenerative joint disease)     s/p Left TKA 06/2008  . Metabolic syndrome   . Hematuria   . Psychosexual dysfunction with inhibited sexual excitement   . Hypertrophy of prostate without urinary obstruction and other lower urinary tract symptoms (LUTS)   . Anxiety state, unspecified   . Allergic rhinitis, cause unspecified   . OSA on CPAP     compliant with CPAP  . Obesity   . Hyperlipidemia   . GERD (gastroesophageal reflux disease)   . Sick sinus syndrome [facility])     s/p PPM by Dr Deborah Chalk  . Atrial flutter (HCC)     s/p atrial flutter CTI ablation 10/26/10  . Hiatal hernia   . Gout   . Chronic kidney disease     Kidney Stones  . Gout   .  Hematuria     worked up by dr Annabell Howells 1 month ago no problems found   . Urinary leakage     Past Surgical History  Procedure Laterality Date  . Esophagogastroduodenoscopy  1992    Dr. Doreatha Martin  . Hiatal hernia repair  04/1998  . Balloon angioplasty, artery  2003    Ostium of the 2nd DX  . Cholecystectomy  1999    Dr. Luan Pulling  . Basal cell carcinoma excision  12/2003    On back  . Total knee arthroplasty Left 06/2008    Dr. Despina Hick  . Pacemaker insertion  10/2009    Medtronic  . Atrial flutter ablation  10/26/10  . Appendectomy    . Cystoscopy      stone removal  . Pars plana vitrectomy  01/01/2012    Procedure: PARS PLANA VITRECTOMY WITH 20 GAUGE;  Surgeon: Edmon Crape, MD;  Location: [facility] OR;  Service: Ophthalmology;  Laterality: Right;  VITRECTOMY 25G, REMOVE SILICONE OIL, INJECTION OF SILICONE OIL 5000 TO RIGHT EYE, MEMBRANE PEEL, REPAIR COMPLEX  RETINAL DETACHMENT, REMOVAL NON MAGNETIC FOREIGN BODY.  . Silicon oil removal  01/01/2012    Procedure: SILICON OIL REMOVAL;  Surgeon: Edmon Crape, MD;  Location: [facility] OR;  Service: Ophthalmology;  Laterality: Right;  INSERT SILICONE OIL  . Left heart catheterization with coronary angiogram N/A 06/25/2013    Procedure: LEFT HEART CATHETERIZATION WITH CORONARY ANGIOGRAM;  Surgeon: Micheline Chapman, MD;  Location[facility] CATH LAB;  Service: Cardiovascular;  Laterality: N/A;  . Hernia repair    . Eye surgery Bilateral     cataract lens replaced  . Heart stent  years ago    x 1  . Total knee arthroplasty Right 07/31/2015    Procedure: RIGHT TOTAL KNEE ARTHROPLASTY;  Surgeon: Ollen Gross, MD;  Location: [facility] ORS;  Service: Orthopedics;  Laterality: Right;     Medications: Current Outpatient Prescriptions  Medication Sig Dispense Refill  . alfuzosin (UROXATRAL) 10 MG 24 hr tablet Take 10 mg by mouth daily with breakfast.    . allopurinol (ZYLOPRIM) 300 MG tablet TAKE 1 TABLET BY MOUTH EVERY DAY 30 tablet 5  . amLODipine (NORVASC) 10 MG tablet Take 5 mg by mouth daily.    . benazepril (LOTENSIN) 40 MG tablet Take 0.5 tablets (20 mg total) by mouth daily.    . carvedilol (COREG) 6.25 MG tablet TAKE 1 TABLET BY MOUTH TWICE DAILY 180 tablet 2  . colchicine 0.6 MG tablet Take 1 tablet (0.6 mg total) by mouth daily as needed (gout). 30 tablet 2  . doxazosin (CARDURA) 8 MG tablet Take 8 mg by mouth daily.   10  . famciclovir (FAMVIR) 500 MG tablet Take 1,500 mg by mouth daily as needed (cold sores). As directed    . finasteride (PROSCAR) 5 MG tablet Take 5 mg by mouth daily.      Marland Kitchen FLUoxetine (PROZAC) 20 MG capsule TAKE 1 CAPSULE BY MOUTH EVERY DAY 90 capsule 0  . hydrochlorothiazide (MICROZIDE) 12.5 MG capsule Take 12.5 mg by mouth daily.    . Misc Natural Products (PROSTATE) CAPS Take by mouth daily.    . Misc Natural Products (PUMPKIN SEED OIL) CAPS Take by mouth daily.    Marland Kitchen oxybutynin (DITROPAN XL) 15  MG 24 hr tablet Take 7.5 mg by mouth at bedtime.     Marland Kitchen oxyCODONE (OXY IR/ROXICODONE) 5 MG immediate release tablet Take 1-2 tablets (5-10 mg total) by mouth every 4 (four) hours as needed  for severe pain (for gout).    . rivaroxaban (XARELTO) 20 MG TABS tablet Take 1 tablet (20 mg total) by mouth daily with supper. 30 tablet 11  . zolpidem (AMBIEN) 10 MG tablet TAKE 1 TABLET BY MOUTH AT BEDTIME AS NEEDED 30 tablet 2   No current facility-administered medications for this visit.    Allergies: Allergies  Allergen Reactions  . Morphine Sulfate Other (See Comments)    Hallucinations - feels like he's back in [place]    Social History: The patient  reports that he quit smoking about 25 years ago. His smoking use included Cigarettes. He has a 30 pack-year smoking history. He has never used smokeless tobacco. He reports that he drinks about 3.6 oz of alcohol per week. He reports that he does not use illicit drugs.   Family History: The patient's family history includes Aneurysm in his sister; Breast cancer in his mother; Dementia in his mother; Heart attack in his father; Hypertension in his father. There is no history of Colon cancer, Anesthesia problems, or Prostate cancer.   Review of Systems: Please see the history of present illness.   Otherwise, the review of systems is positive for none.   All other systems are reviewed and negative.   Physical Exam: VS:  BP 140/80 mmHg  Pulse 60  Ht 6' (1.829 m)  Wt 275 lb 12.8 oz (125.102 kg)  BMI 37.40 kg/m2 .  BMI Body mass index is 37.4 kg/(m^2).  Wt Readings from Last 3 Encounters:  12/05/15 275 lb 12.8 oz (125.102 kg)  10/25/15 281 lb 12.8 oz (127.824 kg)  09/06/15 272 lb 4 oz (123.492 kg)    General: Pleasant. Remains obese but alert and in no acute distress. His weight is going back down.  HEENT: Normal. Neck: Supple, no JVD, carotid bruits, or masses noted.  Cardiac: Irregular irregular rhythm. Rate is ok. No murmurs, rubs, or  gallops. No edema.  Respiratory:  Lungs are clear to auscultation bilaterally with normal work of breathing.  GI: Soft and nontender.  MS: No deformity or atrophy. Gait and ROM intact. Skin: Warm and dry. Color is normal.  Neuro:  Strength and sensation are intact and no gross focal deficits noted.  Psych: Alert, appropriate and with normal affect.   LABORATORY DATA:  EKG:  EKG is not ordered today.  Lab Results  Component Value Date   WBC 14.9* 08/01/2015   HGB 13.0 08/01/2015   HCT 37.8* 08/01/2015   PLT 159 08/01/2015   GLUCOSE 166* 08/01/2015   CHOL 98 08/08/2015   TRIG 85.0 08/08/2015   HDL 25.70* 08/08/2015   LDLDIRECT 51.8 11/28/2010   LDLCALC 55 08/08/2015   ALT 23 07/24/2015   AST 30 07/24/2015   NA 137 08/01/2015   K 4.6 08/01/2015   CL 102 08/01/2015   CREATININE 0.88 08/01/2015   BUN 17 08/01/2015   CO2 29 08/01/2015   TSH 2.08 08/08/2015   PSA 0.30 05/27/2012   INR 1.29 07/31/2015   HGBA1C 5.7 08/08/2015    BNP (last 3 results) No results for input(s): BNP in the last 8760 hours.  ProBNP (last 3 results) No results for input(s): PROBNP in the last 8760 hours.   Other Studies Reviewed Today:  Coronary angiography from October 2014:  Coronary dominance: right  Left mainstem: The left mainstem arises from the left cusp. There is mild calcification. There is diffuse irregularity but no significant stenosis present.  Left anterior descending (LAD): The LAD is mildly calcified.  There are diffuse luminal irregularities without significant stenoses. The vessel reaches the left ventricular apex. The first and second diagonal branches both have ostial stenoses. The first diagonal has 70% stenosis. The second diagonal has 80% ostial stenosis. Both vessels are small to medium in caliber.  Left circumflex (LCx): There is a large ramus intermedius branch. There is no significant stenosis of this branch noted. The AV groove circumflex is small without significant  stenosis.  Right coronary artery (RCA): The RCA is dominant. The proximal vessel has 20% stenosis. The mid vessel has 30% stenosis. The PDA and PLA branches are patent.  Left ventriculography: Left ventricular systolic function is low-normal, LVEF is estimated at 50%, there is no significant mitral regurgitation   Final Conclusions:  1. Mild nonobstructive coronary artery disease as detailed above  2. Low normal left ventricular systolic function   Recommendations: Medical therapy. The patient has stenosis of the diagonal branches of his LAD. These branches are fairly small and do not correlate with this perfusion defect on the Myoview scan. I do not think they're contributing to his shortness of breath. Medical therapy is recommended.   Tonny Bollman  06/25/2013, 9:54 AM    Echo Study Conclusions from 05/2013 - Left ventricle: The cavity size was normal. Wall thickness was increased in a pattern of moderate LVH. Systolic function was mildly reduced. The estimated ejection fraction was in the range of 45% to 50%. Wall motion was normal; there were no regional wall motion abnormalities. - Left atrium: The atrium was moderately dilated. - Right atrium: The atrium was mildly dilated.  Assessment / Plan: 1. CAD - no active symptoms - to continue with medical management and CV risk factor modification.   2. Dyspnea - basically resolved.   3. Chronic atrial fib - managed with rate control and anticoagulation - no problems noted.  He has had prior issues with hematuria. May consider WATCHMAN and he is already on Dr. Jenel Lucks list for consideration. He is not ready to proceed and is ok with how he is currently doing.   4. HTN - good control at home. He will continue to monitor.   5. OSA - on CPAP   6. Underlying PPM - followed by Dr. Johney Frame.   7. Borderline DM - recent A1C noted  8. Low normal EF - asymptomatic  9. Knee surgery - s/p surgery 4 months ago - doing  well.  Current medicines are reviewed with the patient today.  The patient does not have concerns regarding medicines other than what has been noted above.  The following changes have been made:  See above.  Labs/ tests ordered today include:   No orders of the defined types were placed in this encounter.     Disposition:   FU with Dr. Johney Frame as planned in December. I will see back as needed.    Patient is agreeable to this plan and will call if any problems develop in the interim.   Signed: Rosalio Macadamia, RN, ANP-C 12/05/2015 3:22 PM  [facility] [address] [place], [place]  [address] Phone: [phone] Fax: [phone]

## 2015-12-18 ENCOUNTER — Telehealth: Payer: Self-pay | Admitting: Family Medicine

## 2015-12-18 NOTE — Telephone Encounter (Signed)
LM for pt to sch AWV, mn ° °

## 2015-12-21 ENCOUNTER — Other Ambulatory Visit: Payer: Self-pay | Admitting: Family Medicine

## 2015-12-21 NOTE — Telephone Encounter (Signed)
Sent. Thanks.   

## 2015-12-21 NOTE — Telephone Encounter (Signed)
Electronic refill request. Last Filled:    30 tablet 5 06/29/2015  Last office visit:   10/19/15 CPE  Please advise.

## 2015-12-27 ENCOUNTER — Encounter: Payer: Commercial Managed Care - PPO | Admitting: *Deleted

## 2015-12-29 ENCOUNTER — Encounter: Payer: Self-pay | Admitting: Cardiology

## 2016-01-01 ENCOUNTER — Other Ambulatory Visit: Payer: Self-pay | Admitting: Family Medicine

## 2016-01-12 ENCOUNTER — Ambulatory Visit (INDEPENDENT_AMBULATORY_CARE_PROVIDER_SITE_OTHER): Payer: Commercial Managed Care - PPO | Admitting: *Deleted

## 2016-01-12 DIAGNOSIS — R001 Bradycardia, unspecified: Secondary | ICD-10-CM | POA: Diagnosis not present

## 2016-01-17 NOTE — Progress Notes (Signed)
Remote pacemaker transmission.   

## 2016-01-24 ENCOUNTER — Other Ambulatory Visit: Payer: Self-pay | Admitting: Family Medicine

## 2016-01-24 NOTE — Telephone Encounter (Signed)
Received refill request electronically Last office visit 09/06/15 Is it okay to refill?

## 2016-01-25 NOTE — Telephone Encounter (Signed)
Sent. Thanks.   

## 2016-01-31 ENCOUNTER — Encounter: Payer: Self-pay | Admitting: Family Medicine

## 2016-01-31 ENCOUNTER — Encounter: Payer: Self-pay | Admitting: Cardiology

## 2016-01-31 ENCOUNTER — Ambulatory Visit (INDEPENDENT_AMBULATORY_CARE_PROVIDER_SITE_OTHER): Payer: Commercial Managed Care - PPO | Admitting: Family Medicine

## 2016-01-31 VITALS — BP 130/78 | HR 68 | Temp 98.8°F | Ht 72.0 in | Wt 284.2 lb

## 2016-01-31 DIAGNOSIS — R739 Hyperglycemia, unspecified: Secondary | ICD-10-CM

## 2016-01-31 DIAGNOSIS — I10 Essential (primary) hypertension: Secondary | ICD-10-CM | POA: Diagnosis not present

## 2016-01-31 MED ORDER — HYDROCHLOROTHIAZIDE 12.5 MG PO TABS: 6.2500 mg | ORAL_TABLET | ORAL | Status: DC

## 2016-01-31 NOTE — Patient Instructions (Addendum)
Go to the lab on the way out.  We'll contact you with your lab report. Take care.  Glad to see you.  Keep working on your weight.   If you keep losing weight (intentionally), and your BP gets low then stop the HCTZ.  If needed, then stop the amlodipine.

## 2016-01-31 NOTE — Progress Notes (Signed)
Pre visit review using our clinic review tool, if applicable. No additional management support is needed unless otherwise documented below in the visit note.  H/o abnormal sugar, due for f/u.  Not DM2 by last A1c.  Not checking sugar.  Overall weight loss, avoiding/limiting carbs.  Due for f/u labs.   He has some episodic fatigue but not consistent.  D/w pt about diet and weight loss in general.    Meds, vitals, and allergies reviewed.   ROS: Per HPI unless specifically indicated in ROS section   GEN: nad, alert and oriented HEENT: mucous membranes moist NECK: supple w/o LA CV: rrr. PULM: ctab, no inc wob ABD: soft, +bs EXT: no edema SKIN: no acute rash

## 2016-02-01 LAB — HEMOGLOBIN A1C: HEMOGLOBIN A1C: 6 % (ref 4.6–6.5)

## 2016-02-01 NOTE — Assessment & Plan Note (Signed)
Recheck A1c, continue work on weight loss.

## 2016-02-01 NOTE — Assessment & Plan Note (Signed)
D/w pt about BP meds and planning.  If continued weight loss (intentionally), and BP gets low then stop the HCTZ.  If needed, then stop the amlodipine.

## 2016-02-02 LAB — CUP PACEART REMOTE DEVICE CHECK
Date Time Interrogation Session: 20170521191505
Implantable Lead Implant Date: 20110304
Implantable Lead Location: 753860
Implantable Lead Model: 5076
Lead Channel Impedance Value: 368 Ohm
Lead Channel Setting Pacing Amplitude: 2.5 V
Lead Channel Setting Pacing Pulse Width: 0.4 ms
Lead Channel Setting Sensing Sensitivity: 0.9 mV
MDC IDC LEAD IMPLANT DT: 20110304
MDC IDC LEAD LOCATION: 753859
MDC IDC MSMT BATTERY VOLTAGE: 2.88 V
MDC IDC MSMT LEADCHNL RV IMPEDANCE VALUE: 496 Ohm
MDC IDC STAT BRADY RV PERCENT PACED: 97.53 %

## 2016-03-02 ENCOUNTER — Other Ambulatory Visit: Payer: Self-pay | Admitting: Family Medicine

## 2016-04-07 ENCOUNTER — Other Ambulatory Visit: Payer: Self-pay | Admitting: Family Medicine

## 2016-04-07 NOTE — Telephone Encounter (Signed)
Sent. Thanks.   

## 2016-04-17 ENCOUNTER — Telehealth: Payer: Self-pay | Admitting: Cardiology

## 2016-04-17 ENCOUNTER — Ambulatory Visit (INDEPENDENT_AMBULATORY_CARE_PROVIDER_SITE_OTHER): Payer: Commercial Managed Care - PPO | Admitting: *Deleted

## 2016-04-17 DIAGNOSIS — R001 Bradycardia, unspecified: Secondary | ICD-10-CM | POA: Diagnosis not present

## 2016-04-17 LAB — CUP PACEART REMOTE DEVICE CHECK
Battery Voltage: 2.85 V
Implantable Lead Implant Date: 20110304
Implantable Lead Location: 753860
Implantable Lead Model: 5076
Implantable Lead Model: 5076
Lead Channel Impedance Value: 384 Ohm
Lead Channel Impedance Value: 504 Ohm
Lead Channel Sensing Intrinsic Amplitude: 2.07 mV
Lead Channel Setting Pacing Pulse Width: 0.4 ms
MDC IDC LEAD IMPLANT DT: 20110304
MDC IDC LEAD LOCATION: 753859
MDC IDC SESS DTM: 20170824081520
MDC IDC SET LEADCHNL RV PACING AMPLITUDE: 2.5 V
MDC IDC SET LEADCHNL RV SENSING SENSITIVITY: 0.9 mV
MDC IDC STAT BRADY RV PERCENT PACED: 97.31 %

## 2016-04-17 NOTE — Telephone Encounter (Signed)
Spoke with pt and reminded pt of remote transmission that is due today. Pt verbalized understanding.   

## 2016-04-18 NOTE — Progress Notes (Signed)
Remote pacemaker transmission.   

## 2016-04-24 ENCOUNTER — Other Ambulatory Visit: Payer: Self-pay | Admitting: Internal Medicine

## 2016-04-24 ENCOUNTER — Encounter: Payer: Self-pay | Admitting: Cardiology

## 2016-05-09 ENCOUNTER — Other Ambulatory Visit: Payer: Self-pay | Admitting: Family Medicine

## 2016-05-09 NOTE — Telephone Encounter (Signed)
Electronic refill request. Last Filled:    30 tablet 2 10/16/2015  Last office visit:   01/31/16  Please advise.

## 2016-05-10 NOTE — Telephone Encounter (Signed)
Medication phoned to pharmacy.  

## 2016-05-10 NOTE — Telephone Encounter (Signed)
Please call in.  Thanks.   

## 2016-05-20 ENCOUNTER — Ambulatory Visit (INDEPENDENT_AMBULATORY_CARE_PROVIDER_SITE_OTHER): Payer: Commercial Managed Care - PPO | Admitting: *Deleted

## 2016-05-20 ENCOUNTER — Telehealth: Payer: Self-pay | Admitting: Cardiology

## 2016-05-20 DIAGNOSIS — R001 Bradycardia, unspecified: Secondary | ICD-10-CM | POA: Diagnosis not present

## 2016-05-20 NOTE — Telephone Encounter (Signed)
Spoke with pt and reminded pt of remote transmission that is due today. Pt verbalized understanding.   

## 2016-05-21 NOTE — Progress Notes (Signed)
Remote pacemaker transmission.   

## 2016-05-22 ENCOUNTER — Encounter: Payer: Self-pay | Admitting: Cardiology

## 2016-06-13 LAB — CUP PACEART REMOTE DEVICE CHECK
Brady Statistic RV Percent Paced: 96.73 %
Implantable Lead Implant Date: 20110304
Implantable Lead Location: 753859
Implantable Lead Location: 753860
Lead Channel Impedance Value: 488 Ohm
Lead Channel Setting Pacing Pulse Width: 0.4 ms
MDC IDC LEAD IMPLANT DT: 20110304
MDC IDC MSMT BATTERY VOLTAGE: 2.85 V
MDC IDC MSMT LEADCHNL RA IMPEDANCE VALUE: 368 Ohm
MDC IDC SESS DTM: 20170925232223
MDC IDC SET LEADCHNL RV PACING AMPLITUDE: 2.5 V
MDC IDC SET LEADCHNL RV SENSING SENSITIVITY: 0.9 mV

## 2016-06-18 ENCOUNTER — Encounter: Payer: Self-pay | Admitting: Internal Medicine

## 2016-06-19 ENCOUNTER — Encounter: Payer: Self-pay | Admitting: Nurse Practitioner

## 2016-07-01 ENCOUNTER — Encounter: Payer: Self-pay | Admitting: Internal Medicine

## 2016-07-02 ENCOUNTER — Ambulatory Visit (HOSPITAL_COMMUNITY): Payer: PPO | Admitting: Anesthesiology

## 2016-07-02 ENCOUNTER — Other Ambulatory Visit: Payer: Self-pay | Admitting: Urology

## 2016-07-02 ENCOUNTER — Encounter (HOSPITAL_COMMUNITY)
Admission: RE | Disposition: A | Discharge status: Home / Self Care | Payer: Self-pay | Source: Ambulatory Visit | Attending: Urology

## 2016-07-02 ENCOUNTER — Encounter (HOSPITAL_COMMUNITY): Payer: Self-pay | Admitting: *Deleted

## 2016-07-02 ENCOUNTER — Ambulatory Visit (HOSPITAL_COMMUNITY)
Admission: RE | Admit: 2016-07-02 | Discharge: 2016-07-02 | Disposition: A | Discharge status: Home / Self Care | Payer: PPO | Source: Ambulatory Visit | Attending: Urology | Admitting: Urology

## 2016-07-02 DIAGNOSIS — E785 Hyperlipidemia, unspecified: Secondary | ICD-10-CM | POA: Insufficient documentation

## 2016-07-02 DIAGNOSIS — Z9049 Acquired absence of other specified parts of digestive tract: Secondary | ICD-10-CM | POA: Insufficient documentation

## 2016-07-02 DIAGNOSIS — K219 Gastro-esophageal reflux disease without esophagitis: Secondary | ICD-10-CM | POA: Insufficient documentation

## 2016-07-02 DIAGNOSIS — I481 Persistent atrial fibrillation: Secondary | ICD-10-CM | POA: Insufficient documentation

## 2016-07-02 DIAGNOSIS — I129 Hypertensive chronic kidney disease with stage 1 through stage 4 chronic kidney disease, or unspecified chronic kidney disease: Secondary | ICD-10-CM | POA: Insufficient documentation

## 2016-07-02 DIAGNOSIS — Z96653 Presence of artificial knee joint, bilateral: Secondary | ICD-10-CM | POA: Diagnosis not present

## 2016-07-02 DIAGNOSIS — M109 Gout, unspecified: Secondary | ICD-10-CM | POA: Diagnosis not present

## 2016-07-02 DIAGNOSIS — N400 Enlarged prostate without lower urinary tract symptoms: Secondary | ICD-10-CM | POA: Insufficient documentation

## 2016-07-02 DIAGNOSIS — Z950 Presence of cardiac pacemaker: Secondary | ICD-10-CM | POA: Diagnosis not present

## 2016-07-02 DIAGNOSIS — G4733 Obstructive sleep apnea (adult) (pediatric): Secondary | ICD-10-CM | POA: Diagnosis not present

## 2016-07-02 DIAGNOSIS — Z85828 Personal history of other malignant neoplasm of skin: Secondary | ICD-10-CM | POA: Insufficient documentation

## 2016-07-02 DIAGNOSIS — I4892 Unspecified atrial flutter: Secondary | ICD-10-CM | POA: Insufficient documentation

## 2016-07-02 DIAGNOSIS — N201 Calculus of ureter: Secondary | ICD-10-CM | POA: Diagnosis not present

## 2016-07-02 DIAGNOSIS — Z9841 Cataract extraction status, right eye: Secondary | ICD-10-CM | POA: Insufficient documentation

## 2016-07-02 DIAGNOSIS — Z87891 Personal history of nicotine dependence: Secondary | ICD-10-CM | POA: Insufficient documentation

## 2016-07-02 DIAGNOSIS — Z9842 Cataract extraction status, left eye: Secondary | ICD-10-CM | POA: Insufficient documentation

## 2016-07-02 DIAGNOSIS — N189 Chronic kidney disease, unspecified: Secondary | ICD-10-CM | POA: Insufficient documentation

## 2016-07-02 DIAGNOSIS — Z6839 Body mass index (BMI) 39.0-39.9, adult: Secondary | ICD-10-CM | POA: Insufficient documentation

## 2016-07-02 DIAGNOSIS — N1330 Unspecified hydronephrosis: Secondary | ICD-10-CM | POA: Insufficient documentation

## 2016-07-02 DIAGNOSIS — Z87442 Personal history of urinary calculi: Secondary | ICD-10-CM | POA: Diagnosis not present

## 2016-07-02 DIAGNOSIS — E669 Obesity, unspecified: Secondary | ICD-10-CM | POA: Diagnosis not present

## 2016-07-02 DIAGNOSIS — I2510 Atherosclerotic heart disease of native coronary artery without angina pectoris: Secondary | ICD-10-CM | POA: Insufficient documentation

## 2016-07-02 DIAGNOSIS — Z9889 Other specified postprocedural states: Secondary | ICD-10-CM | POA: Insufficient documentation

## 2016-07-02 HISTORY — PX: CYSTOSCOPY/URETEROSCOPY/HOLMIUM LASER/STENT PLACEMENT: SHX6546

## 2016-07-02 LAB — BASIC METABOLIC PANEL
Anion gap: 5 (ref 5–15)
BUN: 21 mg/dL — ABNORMAL HIGH (ref 6–20)
CHLORIDE: 103 mmol/L (ref 101–111)
CO2: 31 mmol/L (ref 22–32)
CREATININE: 1.5 mg/dL — AB (ref 0.61–1.24)
Calcium: 9.7 mg/dL (ref 8.9–10.3)
GFR calc non Af Amer: 44 mL/min — ABNORMAL LOW (ref >60)
GFR, EST AFRICAN AMERICAN: 51 mL/min — AB (ref >60)
Glucose, Bld: 104 mg/dL — ABNORMAL HIGH (ref 65–99)
POTASSIUM: 4.3 mmol/L (ref 3.5–5.1)
Sodium: 139 mmol/L (ref 135–145)

## 2016-07-02 LAB — CBC
HEMATOCRIT: 42.6 % (ref 39.0–52.0)
HEMOGLOBIN: 15.1 g/dL (ref 13.0–17.0)
MCH: 32.9 pg (ref 26.0–34.0)
MCHC: 35.4 g/dL (ref 30.0–36.0)
MCV: 92.8 fL (ref 78.0–100.0)
Platelets: 165 10*3/uL (ref 150–400)
RBC: 4.59 MIL/uL (ref 4.22–5.81)
RDW: 13.4 % (ref 11.5–15.5)
WBC: 12.9 10*3/uL — ABNORMAL HIGH (ref 4.0–10.5)

## 2016-07-02 SURGERY — CYSTOSCOPY/URETEROSCOPY/HOLMIUM LASER/STENT PLACEMENT
Anesthesia: General | Site: Urethra | Laterality: Right

## 2016-07-02 MED ORDER — LACTATED RINGERS IV SOLN
INTRAVENOUS | Status: DC | PRN
Start: 2016-07-02 — End: 2016-07-02
  Administered 2016-07-02: 17:00:00 via INTRAVENOUS

## 2016-07-02 MED ORDER — FENTANYL CITRATE (PF) 100 MCG/2ML IJ SOLN: 25.0000 ug | INTRAMUSCULAR | Status: DC | PRN

## 2016-07-02 MED ORDER — DEXTROSE 5 % IV SOLN
2.0000 g | INTRAVENOUS | Status: AC
  Administered 2016-07-02: 2 g via INTRAVENOUS
  Filled 2016-07-02: qty 2

## 2016-07-02 MED ORDER — PROPOFOL 10 MG/ML IV BOLUS
INTRAVENOUS | Status: AC
  Filled 2016-07-02: qty 20

## 2016-07-02 MED ORDER — SULFAMETHOXAZOLE-TRIMETHOPRIM 800-160 MG PO TABS: 1.0000 | ORAL_TABLET | Freq: Two times a day (BID) | ORAL | 0 refills | Status: AC

## 2016-07-02 MED ORDER — PROPOFOL 10 MG/ML IV BOLUS
INTRAVENOUS | Status: DC | PRN
  Administered 2016-07-02: 200 mg via INTRAVENOUS

## 2016-07-02 MED ORDER — ONDANSETRON HCL 4 MG/2ML IJ SOLN
INTRAMUSCULAR | Status: DC | PRN
  Administered 2016-07-02: 4 mg via INTRAVENOUS

## 2016-07-02 MED ORDER — LIDOCAINE 2% (20 MG/ML) 5 ML SYRINGE
INTRAMUSCULAR | Status: AC
  Filled 2016-07-02: qty 5

## 2016-07-02 MED ORDER — SODIUM CHLORIDE 0.9 % IR SOLN
Status: DC | PRN
  Administered 2016-07-02: 1000 mL via INTRAVESICAL
  Administered 2016-07-02: 3000 mL via INTRAVESICAL

## 2016-07-02 MED ORDER — IOHEXOL 300 MG/ML  SOLN
INTRAMUSCULAR | Status: DC | PRN
  Administered 2016-07-02: 3 mL via URETHRAL

## 2016-07-02 MED ORDER — PROMETHAZINE HCL 25 MG/ML IJ SOLN: 6.2500 mg | INTRAMUSCULAR | Status: DC | PRN

## 2016-07-02 MED ORDER — SENNOSIDES-DOCUSATE SODIUM 8.6-50 MG PO TABS: 1.0000 | ORAL_TABLET | Freq: Two times a day (BID) | ORAL | 0 refills | Status: AC

## 2016-07-02 MED ORDER — FENTANYL CITRATE (PF) 100 MCG/2ML IJ SOLN
INTRAMUSCULAR | Status: DC | PRN
  Administered 2016-07-02 (×2): 50 ug via INTRAVENOUS

## 2016-07-02 MED ORDER — OXYCODONE-ACETAMINOPHEN 5-325 MG PO TABS: 1.0000 | ORAL_TABLET | Freq: Four times a day (QID) | ORAL | 0 refills | Status: AC | PRN

## 2016-07-02 MED ORDER — FENTANYL CITRATE (PF) 100 MCG/2ML IJ SOLN
INTRAMUSCULAR | Status: AC
  Filled 2016-07-02: qty 2

## 2016-07-02 MED ORDER — LIDOCAINE HCL (CARDIAC) 20 MG/ML IV SOLN
INTRAVENOUS | Status: DC | PRN
  Administered 2016-07-02: 100 mg via INTRAVENOUS

## 2016-07-02 MED ORDER — ONDANSETRON HCL 4 MG/2ML IJ SOLN
INTRAMUSCULAR | Status: AC
Start: 2016-07-02 — End: 2016-07-02
  Filled 2016-07-02: qty 2

## 2016-07-02 SURGICAL SUPPLY — 21 items
BAG URO CATCHER STRL LF (MISCELLANEOUS) ×3 IMPLANT
BASKET LASER NITINOL 1.9FR (BASKET) ×3 IMPLANT
CATH INTERMIT  6FR 70CM (CATHETERS) ×3 IMPLANT
CLOTH BEACON ORANGE TIMEOUT ST (SAFETY) ×3 IMPLANT
FIBER LASER FLEXIVA 1000 (UROLOGICAL SUPPLIES) IMPLANT
FIBER LASER FLEXIVA 365 (UROLOGICAL SUPPLIES) IMPLANT
FIBER LASER FLEXIVA 550 (UROLOGICAL SUPPLIES) IMPLANT
FIBER LASER TRAC TIP (UROLOGICAL SUPPLIES) ×3 IMPLANT
GLOVE BIOGEL M STRL SZ7.5 (GLOVE) ×3 IMPLANT
GOWN STRL REUS W/TWL LRG LVL3 (GOWN DISPOSABLE) ×6 IMPLANT
GUIDEWIRE ANG ZIPWIRE 038X150 (WIRE) ×3 IMPLANT
GUIDEWIRE STR DUAL SENSOR (WIRE) ×3 IMPLANT
IV NS 1000ML (IV SOLUTION)
IV NS 1000ML BAXH (IV SOLUTION) IMPLANT
MANIFOLD NEPTUNE II (INSTRUMENTS) ×3 IMPLANT
PACK CYSTO (CUSTOM PROCEDURE TRAY) ×3 IMPLANT
STENT CONTOUR 6FRX26X.038 (STENTS) ×3 IMPLANT
SYR CONTROL 10ML LL (SYRINGE) ×3 IMPLANT
TUBE FEEDING 8FR 16IN STR KANG (MISCELLANEOUS) ×3 IMPLANT
TUBING CONNECTING 10 (TUBING) ×2 IMPLANT
TUBING CONNECTING 10' (TUBING) ×1

## 2016-07-02 NOTE — Brief Op Note (Signed)
07/02/2016  5:57 PM  PATIENT:  [name] [name]  75 y.o. male  PRE-OPERATIVE DIAGNOSIS:  right ureteral stone  POST-OPERATIVE DIAGNOSIS:  right ureteral stone  PROCEDURE:  Procedure(s): CYSTOSCOPY/RIGHT URETEROSCOPY/HOLMIUM LASER/ WITH RIGHT STENT PLACEMENT (Right)  SURGEON:  Surgeon(s) and Role:    * Sebastian Ache, MD - Primary  PHYSICIAN ASSISTANT:   ASSISTANTS: none   ANESTHESIA:   general  EBL:  No intake/output data recorded.  BLOOD ADMINISTERED:none  DRAINS: none   LOCAL MEDICATIONS USED:  NONE  SPECIMEN:  Source of Specimen:  Rt distal ureteral stone  DISPOSITION OF SPECIMEN:  [facility]  COUNTS:  YES  TOURNIQUET:  * No tourniquets in log *  DICTATION: .Other Dictation: Dictation Number [id number]  PLAN OF CARE: Discharge to home after PACU  PATIENT DISPOSITION:  PACU - hemodynamically stable.   Delay start of Pharmacological VTE agent (>24hrs) due to surgical blood loss or risk of bleeding: yes

## 2016-07-02 NOTE — H&P (Signed)
[name] [name] is an 75 y.o. male.    Chief Complaint: PRe-op RIGHT ureteroscopic stone manipulation  HPI:   1 - RIGHT Ureteral Stone - 38mm fusiform Rt UVJ stone by CT 06/2016 on eval flank pain. Stone is solitary. Mild hdyro. Most recent UA withtou infectious parameters. No interval fevers. His colic is refracotry. He has h/o Gout.  Today "[name]" is seen to proceed with RIGHT ureteroscopic stone manipulation for distal stone with refractory colic.   Past Medical History:  Diagnosis Date  . Allergic rhinitis, cause unspecified   . Anxiety state, unspecified   . Atrial flutter (HCC)    s/p atrial flutter CTI ablation 10/26/10  . CAD (coronary artery disease)    s/p PCI 2003. Last stress test in Feb 2011 was normal  . Chronic kidney disease    Kidney Stones  . DJD (degenerative joint disease)    s/p Left TKA 06/2008  . ETOH abuse   . GERD (gastroesophageal reflux disease)   . Gout   . Gout   . Hematuria   . Hematuria    worked up by dr Annabell Howells no problems found   . Hiatal hernia   . HTN (hypertension)   . Hyperlipidemia   . Hypertrophy of prostate without urinary obstruction and other lower urinary tract symptoms (LUTS)   . Metabolic syndrome   . Obesity   . OSA on CPAP    compliant with CPAP  . Persistent atrial fibrillation (HCC)    on Multaq and Pradaxa  . Psychosexual dysfunction with inhibited sexual excitement   . Sick sinus syndrome [facility])    s/p PPM by Dr Deborah Chalk  . Urinary leakage     Past Surgical History:  Procedure Laterality Date  . APPENDECTOMY    . atrial flutter ablation  10/26/10  . BALLOON ANGIOPLASTY, ARTERY  2003   Ostium of the 2nd DX  . BASAL CELL CARCINOMA EXCISION  12/2003   On back  . CHOLECYSTECTOMY  1999   Dr. Luan Pulling  . CYSTOSCOPY     stone removal  . ESOPHAGOGASTRODUODENOSCOPY  1992   Dr. Doreatha Martin  . EYE SURGERY Bilateral    cataract lens replaced  . heart stent  years ago   x 1  . HERNIA REPAIR    . HIATAL HERNIA REPAIR  04/1998  .  LEFT HEART CATHETERIZATION WITH CORONARY ANGIOGRAM N/A 06/25/2013   Procedure: LEFT HEART CATHETERIZATION WITH CORONARY ANGIOGRAM;  Surgeon: Micheline Chapman, MD;  Location[facility] C[facility] LAB;  Service: Cardiovascular;  Laterality: N/A;  . PACEMAKER INSERTION  10/2009   [employer]tr[employer]  . PARS PLANA VITRECTOMY  01/01/2012   Procedure: PARS PLANA VITRECTOMY WITH 20 GAUGE;  Surgeon: Edmon Crape, MD;  Location: [facility] OR;  Service: Ophthalmology;  Laterality: Right;  VITRECTOMY 25G, REMOVE SILICONE OIL, INJECTION OF SILICONE OIL 5000 TO RIGHT EYE, MEMBRANE PEEL, REPAIR COMPLEX RETINAL DETACHMENT, REMOVAL NON MAGNETIC FOREIGN BODY.  Marland Kitchen SILICON OIL REMOVAL  01/01/2012   Procedure: SILICON OIL REMOVAL;  Surgeon: Edmon Crape, MD;  Location: [facility] OR;  Service: Ophthalmology;  Laterality: Right;  INSERT SILICONE OIL  . TOTAL KNEE ARTHROPLASTY Left 06/2008   Dr. Despina Hick  . TOTAL KNEE ARTHROPLASTY Right 07/31/2015   Procedure: RIGHT TOTAL KNEE ARTHROPLASTY;  Surgeon: Ollen Gross, MD;  Location: [facility] ORS;  Service: Orthopedics;  Laterality: Right;    Family History  Problem Relation Age of Onset  . Dementia Mother   . Breast cancer Mother     metastatic   .  Heart attack Father   . Hypertension Father   . Aneurysm Sister   . Colon cancer Neg Hx   . Anesthesia problems Neg Hx   . Prostate cancer Neg Hx    Social History:  reports that he quit smoking about 25 years ago. His smoking use included Cigarettes. He has a 30.00 pack-year smoking history. He has never used smokeless tobacco. He reports that he drinks about 3.6 oz of alcohol per week . He reports that he does not use drugs.  Allergies:  Allergies  Allergen Reactions  . Morphine Sulfate Other (See Comments)    Hallucinations - feels like he's back in [place]    No prescriptions prior to admission.    No results found for this or any previous visit (from the past 48 hour(s)). No results found.  Review of Systems  Constitutional: Negative.  Negative for  chills and fever.  HENT: Negative.   Eyes: Negative.   Respiratory: Negative.   Cardiovascular: Negative.   Gastrointestinal: Negative.   Genitourinary: Negative.   Musculoskeletal: Negative.   Skin: Negative.   Neurological: Negative.   Endo/Heme/Allergies: Negative.   Psychiatric/Behavioral: Negative.     There were no vitals taken for this visit. Physical Exam  Constitutional: He appears well-developed.  HENT:  Head: Normocephalic.  Eyes: Pupils are equal, round, and reactive to light.  Neck: Normal range of motion.  Cardiovascular: Normal rate.   Respiratory: Effort normal.  GI: Soft.  Significant truncal obesity  Genitourinary:  Genitourinary Comments: Moderate Rt CVAT.   Musculoskeletal: Normal range of motion.  Neurological: He is alert.  Skin: Skin is warm.  Psychiatric: He has a normal mood and affect. His behavior is normal. Judgment and thought content normal.     Assessment/Plan  1 - RIGHT Ureteral Stone - proceed as planned with cysto, right retrograde right ureteroscopy / laser / stent with goal of stone free. Risks, benefits, alternatives, expected peri-op course, need for possible staged approach discussed and he voiced understanding.    Sebastian AcheMANNY, Dolce Sylvia, MD 07/02/2016, 11:43 AM

## 2016-07-02 NOTE — Transfer of Care (Signed)
Immediate Anesthesia Transfer of Care Note  Patient: [name] [name]  Procedure(s) Performed: Procedure(s): CYSTOSCOPY/RIGHT URETEROSCOPY/HOLMIUM LASER/ WITH RIGHT STENT PLACEMENT (Right)  Patient Location: PACU  Anesthesia Type:General  Level of Consciousness:  sedated, patient cooperative and responds to stimulation  Airway & Oxygen Therapy:Patient Spontanous Breathing and Patient connected to face mask oxgen  Post-op Assessment:  Report given to PACU RN and Post -op Vital signs reviewed and stable  Post vital signs:  Reviewed and stable  Last Vitals:  Vitals:   07/02/16 1427  BP: (!) 146/74  Pulse: 67  Resp: 18  Temp: 37 C    Complications: No apparent anesthesia complications

## 2016-07-02 NOTE — Anesthesia Procedure Notes (Signed)
Procedure Name: LMA Insertion Date/Time: 07/02/2016 5:27 PM Performed by: Orest Dikes Pre-anesthesia Checklist: Patient identified, Emergency Drugs available, Suction available and Patient being monitored Patient Re-evaluated:Patient Re-evaluated prior to inductionOxygen Delivery Method: Circle system utilized Preoxygenation: Pre-oxygenation with 100% oxygen Intubation Type: IV induction LMA: LMA inserted LMA Size: 5.0 Number of attempts: 1 Placement Confirmation: positive ETCO2 and breath sounds checked- equal and bilateral Tube secured with: Tape Dental Injury: Teeth and Oropharynx as per pre-operative assessment

## 2016-07-02 NOTE — Discharge Instructions (Signed)
1 - You may have urinary urgency (bladder spasms) and bloody urine on / off with stent in place. This is normal.  2 - Removed tethered stent on Friday morning at home by pulling on string, then blue plastic tubing, and discarding. Office is open Friday in any issues arise.   3 - Call MD or go to ER for fever >102, severe pain / nausea / vomiting not relieved by medications, or acute change in medical status

## 2016-07-02 NOTE — Anesthesia Preprocedure Evaluation (Addendum)
Anesthesia Evaluation  Patient identified by MRN, date of birth, ID band Patient awake    Reviewed: Allergy & Precautions, NPO status , Patient's Chart, lab work & pertinent test results  Airway Mallampati: II  TM Distance: >3 FB Neck ROM: Full    Dental no notable dental hx.    Pulmonary sleep apnea and Continuous Positive Airway Pressure Ventilation , former smoker,    Pulmonary exam normal breath sounds clear to auscultation       Cardiovascular hypertension, + CAD  + dysrhythmias Atrial Fibrillation  Rhythm:Irregular Rate:Normal     Neuro/Psych negative neurological ROS  negative psych ROS   GI/Hepatic Neg liver ROS, GERD  ,  Endo/Other  Morbid obesity  Renal/GU negative Renal ROS  negative genitourinary   Musculoskeletal negative musculoskeletal ROS (+)   Abdominal (+) + obese,   Peds negative pediatric ROS (+)  Hematology negative hematology ROS (+)   Anesthesia Other Findings   Reproductive/Obstetrics negative OB ROS                            Anesthesia Physical Anesthesia Plan  ASA: III  Anesthesia Plan: General   Post-op Pain Management:    Induction: Intravenous  Airway Management Planned: LMA  Additional Equipment:   Intra-op Plan:   Post-operative Plan: Extubation in OR  Informed Consent: I have reviewed the patients History and Physical, chart, labs and discussed the procedure including the risks, benefits and alternatives for the proposed anesthesia with the patient or authorized representative who has indicated his/her understanding and acceptance.   Dental advisory given  Plan Discussed with: CRNA and Surgeon  Anesthesia Plan Comments:         Anesthesia Quick Evaluation

## 2016-07-02 NOTE — Anesthesia Postprocedure Evaluation (Signed)
Anesthesia Post Note  Patient: [name] [name]  Procedure(s) Performed: Procedure(s) (LRB): CYSTOSCOPY/RIGHT URETEROSCOPY/HOLMIUM LASER/ WITH RIGHT STENT PLACEMENT (Right)  Patient location during evaluation: PACU Anesthesia Type: General Level of consciousness: awake and alert Pain management: pain level controlled Vital Signs Assessment: post-procedure vital signs reviewed and stable Respiratory status: spontaneous breathing, nonlabored ventilation, respiratory function stable and patient connected to nasal cannula oxygen Cardiovascular status: blood pressure returned to baseline and stable Postop Assessment: no signs of nausea or vomiting Anesthetic complications: no    Last Vitals:  Vitals:   07/02/16 1804 07/02/16 1815  BP: 113/61 123/66  Pulse: 65 60  Resp: 17 18  Temp: 36.8 C     Last Pain:  Vitals:   07/02/16 1815  TempSrc:   PainSc: Asleep                 Anaisabel Pederson S

## 2016-07-03 ENCOUNTER — Encounter (HOSPITAL_COMMUNITY): Payer: Self-pay | Admitting: Urology

## 2016-07-03 NOTE — Op Note (Signed)
[name] [name]:  [name], [name]               ACCOUNT NO.:  [id number]  MEDICAL RECORD NO.:  [id number]  LOCATION:  [facility]                         [facility]  PHYSICIAN:  Sebastian Acheheodore Shavawn Stobaugh, MD     DATE OF BIRTH:  [birth year]  DATE OF PROCEDURE: 07/02/2016                              OPERATIVE REPORT   DIAGNOSIS:  Right distal ureteral stone with refractory colic.  PROCEDURES PERFORMED: 1. Cystoscopy right retrograde pyelogram and interpretation. 2. Right ureteroscopy with laser lithotripsy. 3. Insertion of right ureteral stent, 6 x 26 contour with tether.  ESTIMATED BLOOD LOSS:  Nil.  COMPLICATION:  None.  SPECIMEN:  Right distal ureteral stone fragments for compositional analysis.  FINDINGS: 1. Mild right hydroureteronephrosis to distal impacted ureteral stone. 2. Complete resolution of all stone fragments larger than 1/3rd mm in     the ureter following laser lithotripsy and basket extraction. 3. Successful placement of right ureteral stent, proximal in the renal     pelvis and distal in the urinary bladder.  INDICATION:  Mr. [name] [name] is a pleasant 75 year old gentleman with history of recurrent nephrolithiasis.  He was found on workup of colicky right flank pain to have a right distal ureteral stone at approximately 5 mm in size.  He underwent a brief trial of medical therapy, however his symptoms became refractory.  Options were discussed for management including continued medical therapy versus shockwave lithotripsy versus ureteroscopy, the latter being favored given distant location.  He wished to proceed.  Informed consent was obtained and placed in the medical record.  PROCEDURE IN DETAIL:  The patient being [name] [name], verified. Procedure being right ureteroscopic stone manipulation was confirmed. Procedure was carried out.  Time-out was performed.  Intravenous antibiotics were administered.  General LMA anesthesia induced.  The patient was placed into a low lithotomy  position.  Sterile field was created by prepping and draping the patient's penis, perineum, and proximal thighs using iodine x3.  Next, cystourethroscopy was performed using a 21-French rigid cystoscope with offset lens.  Inspection of the anterior and posterior urethra were unremarkable.  Inspection of the urinary bladder revealed no diverticula, calcifications, papillary lesions.  Ureteral orifices appeared singleton.  The right ureteral orifice was cannulated with a 6-French end-hole catheter and right retrograde pyelogram was obtained.  Right retrograde pyelogram demonstrated a single right ureter with single-system right kidney.  There was mild to moderate hydroureteronephrosis with some mild tortuosity at the proximal ureter and a filling defect in distal ureter consistent with known stone.  A 0.038 Zip wire was advanced to the level of the lower pole, set aside as a safety wire.  An 8-French feeding tube placed in the urinary bladder for pressure release.  Next, semi-rigid ureteroscopy from the distal right ureter alongside a separate Sensor working wire.  The stone in question was indeed found.  There were significant mucosal edema and stone impaction at this site.  It did appear to be much too large for simple basketing.  As such, holmium laser energy was applied to the stone using settings of 0.2 joules and 20 Hz fragmenting approximately three smaller pieces.  These were then sequentially grasped on their long axis removed and set aside  for compositional analysis.  Semi-rigid ureteroscopy of the remainder length of the right ureter revealed no additional calcifications.  No mucosal abnormalities.  Inspection of the distal ureter revealed complete resolution of all stone fragments larger than 1/3rd mm.  There was some significant mucosal edema at the site just prior stone impaction.  Now, therefore felt that a temporary ureteral stenting could be most prudent as such a 6 x 26  Contour-type stent was placed remaining safety wire using a radiographic guidance. Good proximal and distal deployment were noted.  Tether was left in place and fashioned the dorsum of the penis and procedure was terminated.  The patient tolerated the procedure well.  No immediate periprocedural complications.  The patient was taken to the postanesthesia care unit in stable condition.          ______________________________ Sebastian Ache, MD     TM/MEDQ  D:  07/02/2016  T:  07/03/2016  Job:  [id number]

## 2016-07-25 ENCOUNTER — Other Ambulatory Visit: Payer: Self-pay | Admitting: Family Medicine

## 2016-07-25 ENCOUNTER — Other Ambulatory Visit: Payer: Self-pay | Admitting: Internal Medicine

## 2016-07-25 NOTE — Telephone Encounter (Signed)
Electronic refill request. Last Filled:    21 tablet 1 01/25/2016  Last office visit:   Scheduled 08/16/16

## 2016-07-26 NOTE — Telephone Encounter (Signed)
Sent. Thanks.   

## 2016-08-16 ENCOUNTER — Ambulatory Visit: Payer: Self-pay

## 2016-08-16 ENCOUNTER — Other Ambulatory Visit: Payer: Self-pay

## 2016-08-22 ENCOUNTER — Encounter: Payer: Self-pay | Admitting: Family Medicine

## 2016-09-03 ENCOUNTER — Telehealth: Payer: Self-pay | Admitting: Internal Medicine

## 2016-09-03 NOTE — Telephone Encounter (Signed)
New message    [employer] calling  need to verified Diagnoses on patient.

## 2016-09-03 NOTE — Telephone Encounter (Signed)
Tried to call to speak with [facility] representative I was unable to reach anyone.  Number listed only gives prompts to finally be asked to fax info.  I need to speak with [facility] rep to discuss request

## 2016-09-27 ENCOUNTER — Other Ambulatory Visit: Payer: Self-pay | Admitting: Family Medicine

## 2016-11-11 ENCOUNTER — Other Ambulatory Visit: Payer: Self-pay | Admitting: Family Medicine

## 2016-11-12 NOTE — Telephone Encounter (Signed)
Received refill electronically Last refill 05/10/16 #30/2 Last office visit 01/31/16 Patient has cancelled several appointments.

## 2016-11-13 NOTE — Telephone Encounter (Signed)
Phone number in chart has been disconnected.

## 2016-11-13 NOTE — Telephone Encounter (Signed)
Verify with patient, I thought he moved out of town. thanks.

## 2016-11-14 NOTE — Telephone Encounter (Signed)
Denied, with note to pharmacy for patient to contact the clinic.  Thanks.

## 2016-12-31 ENCOUNTER — Telehealth: Payer: Self-pay

## 2016-12-31 NOTE — Telephone Encounter (Signed)
[name] [name] with [facility] request refill HCTZ; thought pt had moved out of this area from previous refill request; that has not been filled there before but [name] [name] said that pt has moved to [facility] area and she willl ck with pts cardiologist there about HCTZ refill. Nothing further needed.

## 2018-07-13 ENCOUNTER — Emergency Department (HOSPITAL_COMMUNITY)
Admission: EM | Admit: 2018-07-13 | Discharge: 2018-07-13 | Disposition: A | Discharge status: Home / Self Care | Payer: Medicare Other | Attending: Emergency Medicine | Admitting: Emergency Medicine

## 2018-07-13 ENCOUNTER — Emergency Department (HOSPITAL_COMMUNITY): Payer: Medicare Other

## 2018-07-13 ENCOUNTER — Encounter (HOSPITAL_COMMUNITY): Payer: Self-pay

## 2018-07-13 ENCOUNTER — Other Ambulatory Visit: Payer: Self-pay

## 2018-07-13 DIAGNOSIS — Z87891 Personal history of nicotine dependence: Secondary | ICD-10-CM | POA: Diagnosis not present

## 2018-07-13 DIAGNOSIS — M25562 Pain in left knee: Secondary | ICD-10-CM | POA: Diagnosis present

## 2018-07-13 DIAGNOSIS — I10 Essential (primary) hypertension: Secondary | ICD-10-CM | POA: Insufficient documentation

## 2018-07-13 DIAGNOSIS — Z79899 Other long term (current) drug therapy: Secondary | ICD-10-CM | POA: Diagnosis not present

## 2018-07-13 DIAGNOSIS — R739 Hyperglycemia, unspecified: Secondary | ICD-10-CM | POA: Diagnosis not present

## 2018-07-13 DIAGNOSIS — I259 Chronic ischemic heart disease, unspecified: Secondary | ICD-10-CM | POA: Diagnosis not present

## 2018-07-13 DIAGNOSIS — Z7901 Long term (current) use of anticoagulants: Secondary | ICD-10-CM | POA: Diagnosis not present

## 2018-07-13 DIAGNOSIS — M25462 Effusion, left knee: Secondary | ICD-10-CM | POA: Insufficient documentation

## 2018-07-13 LAB — CBC WITH DIFFERENTIAL/PLATELET
Abs Immature Granulocytes: 0.06 10*3/uL (ref 0.00–0.07)
Basophils Absolute: 0 10*3/uL (ref 0.0–0.1)
Basophils Relative: 0 %
Eosinophils Absolute: 0 10*3/uL (ref 0.0–0.5)
Eosinophils Relative: 0 %
HCT: 41.9 % (ref 39.0–52.0)
Hemoglobin: 14 g/dL (ref 13.0–17.0)
Immature Granulocytes: 0 %
Lymphocytes Relative: 5 %
Lymphs Abs: 0.8 10*3/uL (ref 0.7–4.0)
MCH: 31.5 pg (ref 26.0–34.0)
MCHC: 33.4 g/dL (ref 30.0–36.0)
MCV: 94.2 fL (ref 80.0–100.0)
Monocytes Absolute: 1 10*3/uL (ref 0.1–1.0)
Monocytes Relative: 7 %
Neutro Abs: 12.1 10*3/uL — ABNORMAL HIGH (ref 1.7–7.7)
Neutrophils Relative %: 88 %
Platelets: 188 10*3/uL (ref 150–400)
RBC: 4.45 MIL/uL (ref 4.22–5.81)
RDW: 12.9 % (ref 11.5–15.5)
WBC: 13.9 10*3/uL — ABNORMAL HIGH (ref 4.0–10.5)
nRBC: 0 % (ref 0.0–0.2)

## 2018-07-13 LAB — BASIC METABOLIC PANEL
Anion gap: 9 (ref 5–15)
BUN: 12 mg/dL (ref 8–23)
CO2: 27 mmol/L (ref 22–32)
Calcium: 9.3 mg/dL (ref 8.9–10.3)
Chloride: 100 mmol/L (ref 98–111)
Creatinine, Ser: 1.12 mg/dL (ref 0.61–1.24)
GFR calc Af Amer: >60 mL/min (ref >60)
GFR calc non Af Amer: >60 mL/min (ref >60)
Glucose, Bld: 218 mg/dL — ABNORMAL HIGH (ref 70–99)
Potassium: 3.3 mmol/L — ABNORMAL LOW (ref 3.5–5.1)
Sodium: 136 mmol/L (ref 135–145)

## 2018-07-13 LAB — SYNOVIAL CELL COUNT + DIFF, W/ CRYSTALS
Crystals, Fluid: NONE SEEN
Eosinophils-Synovial: 0 % (ref 0–1)
Lymphocytes-Synovial Fld: 1 % (ref 0–20)
Monocyte-Macrophage-Synovial Fluid: 3 % — ABNORMAL LOW (ref 50–90)
Neutrophil, Synovial: 96 % — ABNORMAL HIGH (ref 0–25)
Other Cells-SYN: 0
WBC, Synovial: 36000 /mm3 — ABNORMAL HIGH (ref 0–200)

## 2018-07-13 LAB — CBG MONITORING, ED: Glucose-Capillary: 147 mg/dL — ABNORMAL HIGH (ref 70–99)

## 2018-07-13 MED ORDER — OXYCODONE HCL 5 MG PO TABS: 5.0000 mg | ORAL_TABLET | Freq: Four times a day (QID) | ORAL | 0 refills | Status: AC | PRN

## 2018-07-13 MED ORDER — OXYCODONE-ACETAMINOPHEN 5-325 MG PO TABS
2.0000 | ORAL_TABLET | Freq: Once | ORAL | Status: AC
  Administered 2018-07-13: 2 via ORAL
  Filled 2018-07-13: qty 2

## 2018-07-13 MED ORDER — DOXYCYCLINE HYCLATE 100 MG PO CAPS: 100.0000 mg | ORAL_CAPSULE | Freq: Two times a day (BID) | ORAL | 0 refills | Status: AC

## 2018-07-13 MED ORDER — CEPHALEXIN 500 MG PO CAPS: 500.0000 mg | ORAL_CAPSULE | Freq: Three times a day (TID) | ORAL | 0 refills | Status: AC

## 2018-07-13 MED ORDER — LIDOCAINE HCL 2 % IJ SOLN
20.0000 mL | Freq: Once | INTRAMUSCULAR | Status: AC
  Administered 2018-07-13: 400 mg
  Filled 2018-07-13: qty 20

## 2018-07-13 MED ORDER — FENTANYL CITRATE (PF) 100 MCG/2ML IJ SOLN
50.0000 ug | Freq: Once | INTRAMUSCULAR | Status: AC
  Administered 2018-07-13: 50 ug via INTRAVENOUS
  Filled 2018-07-13: qty 2

## 2018-07-13 NOTE — ED Notes (Signed)
Patient transported to X-ray 

## 2018-07-13 NOTE — ED Provider Notes (Addendum)
[facility] EMERGENCY DEPARTMENT Provider Note   CSN: [id number] Arrival date & time: 07/13/18  1022     History   Chief Complaint Chief Complaint  Patient presents with  . Knee Pain    HPI [name] [name] is a 77 y.o. male.  HPI   77 year old male presents today with left-sided knee pain.  Patient notes he was in his usual state of health last night when he went to bed.  Patient notes he woke up in the middle of the night with a very sharp pain in his left knee.  Patient notes the pain has continued to persist, he notes he cannot ambulate secondary to discomfort.  He notes he has difficulty with flexion of the knee.  He notes associated swelling, denies any fever or systemic symptoms.  Patient surgical history significant for bilateral knee replacements.  Patient denies any upper respiratory symptoms including chest pain or shortness of breath, denies any fever productive cough.  He denies lower extremity swelling or edema other than the swelling to the left knee.  Reports he is a former smoker, uses CPAP at night, but no diagnosed history of COPD.   Past Medical History:  Diagnosis Date  . Allergic rhinitis, cause unspecified   . Anxiety state, unspecified   . Atrial flutter (HCC)    s/p atrial flutter CTI ablation 10/26/10  . CAD (coronary artery disease)    s/p PCI 2003. Last stress test in Feb 2011 was normal  . Chronic kidney disease    Kidney Stones  . DJD (degenerative joint disease)    s/p Left TKA 06/2008  . ETOH abuse   . GERD (gastroesophageal reflux disease)   . Gout   . Gout   . Hematuria   . Hematuria    worked up by dr Annabell Howells no problems found   . Hiatal hernia   . HTN (hypertension)   . Hyperlipidemia   . Hypertrophy of prostate without urinary obstruction and other lower urinary tract symptoms (LUTS)   . Metabolic syndrome   . Obesity   . OSA on CPAP    compliant with CPAP  . Persistent atrial fibrillation    on Multaq and  Pradaxa  . Psychosexual dysfunction with inhibited sexual excitement   . Sick sinus syndrome [facility])    s/p PPM by Dr Deborah Chalk  . Urinary leakage     Patient Active Problem List   Diagnosis Date Noted  . Benign paroxysmal positional vertigo 09/07/2015  . OA (osteoarthritis) of knee 07/31/2015  . Advance care planning 10/14/2014  . Mood change 10/14/2014  . Morbid obesity (HCC) 06/15/2014  . Nonischemic cardiomyopathy (HCC) 05/12/2013  . Shortness of breath 05/12/2013  . Insomnia 10/27/2012  . Hyperglycemia 05/31/2012  . Diverticulosis of colon (without mention of hemorrhage) 05/01/2011  . Nonspecific abnormal finding in stool contents 05/01/2011  . Anticoagulant long-term use 03/19/2011  . CAD (coronary atherosclerotic disease) 03/19/2011  . Stented coronary artery 03/19/2011  . OSA (obstructive sleep apnea) 03/19/2011  . S/P laparoscopic fundoplication 03/19/2011  . Heme positive stool 12/10/2010  . Medicare annual wellness visit, subsequent 12/04/2010  . Irritability 12/04/2010  . Gout 11/26/2010  . ATRIAL FIBRILLATION 07/02/2010  . Symptomatic bradycardia 07/02/2010  . MURMUR 06/29/2010  . OSTEOARTHRITIS 09/18/2007  . Herpes zoster 01/29/2007  . Hyperlipidemia 01/29/2007  . Essential hypertension 01/29/2007  . Coronary atherosclerosis 01/29/2007  . ALLERGIC RHINITIS 01/29/2007  . GERD 01/29/2007  . DYSPEPSIA, CHRONIC 01/29/2007  . BENIGN PROSTATIC  HYPERTROPHY 01/29/2007  . ERECTILE DYSFUNCTION 09/26/2000  . HEMATURIA, MICROSCOPIC 09/26/2000    Past Surgical History:  Procedure Laterality Date  . APPENDECTOMY    . atrial flutter ablation  10/26/10  . BALLOON ANGIOPLASTY, ARTERY  2003   Ostium of the 2nd DX  . BASAL CELL CARCINOMA EXCISION  12/2003   On back  . CHOLECYSTECTOMY  1999   Dr. Luan Pulling  . CYSTOSCOPY     stone removal  . CYSTOSCOPY/URETEROSCOPY/HOLMIUM LASER/STENT PLACEMENT Right 07/02/2016   Procedure: CYSTOSCOPY/RIGHT URETEROSCOPY/HOLMIUM LASER/  WITH RIGHT STENT PLACEMENT;  Surgeon: Sebastian Ache, MD;  Location: [facility];  Service: Urology;  Laterality: Right;  . ESOPHAGOGASTRODUODENOSCOPY  1992   Dr. Doreatha Martin  . EYE SURGERY Bilateral    cataract lens replaced  . heart stent  years ago   x 1  . HERNIA REPAIR    . HIATAL HERNIA REPAIR  04/1998  . LEFT HEART CATHETERIZATION WITH CORONARY ANGIOGRAM N/A 06/25/2013   Procedure: LEFT HEART CATHETERIZATION WITH CORONARY ANGIOGRAM;  Surgeon: Micheline Chapman, MD;  Location: [facility] CATH LAB;  Service: Cardiovascular;  Laterality: N/A;  . PACEMAKER INSERTION  10/2009   [employer]  . PARS PLANA VITRECTOMY  01/01/2012   Procedure: PARS PLANA VITRECTOMY WITH 20 GAUGE;  Surgeon: Edmon Crape, MD;  Location: [facility] OR;  Service: Ophthalmology;  Laterality: Right;  VITRECTOMY 25G, REMOVE SILICONE OIL, INJECTION OF SILICONE OIL 5000 TO RIGHT EYE, MEMBRANE PEEL, REPAIR COMPLEX RETINAL DETACHMENT, REMOVAL NON MAGNETIC FOREIGN BODY.  Marland Kitchen SILICON OIL REMOVAL  01/01/2012   Procedure: SILICON OIL REMOVAL;  Surgeon: Edmon Crape, MD;  Location: [facility] OR;  Service: Ophthalmology;  Laterality: Right;  INSERT SILICONE OIL  . TOTAL KNEE ARTHROPLASTY Left 06/2008   Dr. Despina Hick  . TOTAL KNEE ARTHROPLASTY Right 07/31/2015   Procedure: RIGHT TOTAL KNEE ARTHROPLASTY;  Surgeon: Ollen Gross, MD;  Location: [facility];  Service: Orthopedics;  Laterality: Right;        Home Medications    Prior to Admission medications   Medication Sig Start Date End Date Taking? Authorizing Provider  allopurinol (ZYLOPRIM) 300 MG tablet TAKE 1 TABLET BY MOUTH EVERY DAY 12/21/15   Joaquim Nam, MD  amLODipine (NORVASC) 10 MG tablet Take 5 mg by mouth daily.    [provider]  amLODipine (NORVASC) 10 MG tablet Take 0.5 tablets (5 mg total) by mouth daily. 07/25/16   Allred, Fayrene Fearing, MD  amoxicillin (AMOXIL) 500 MG capsule Take 2,000 mg by mouth daily as needed (Prior to dental work.).    [provider]  benazepril (LOTENSIN) 40 MG  tablet Take 0.5 tablets (20 mg total) by mouth daily. 08/14/15   Joaquim Nam, MD  carvedilol (COREG) 6.25 MG tablet TAKE 1 TABLET BY MOUTH TWICE DAILY 04/24/16   Allred, Fayrene Fearing, MD  colchicine 0.6 MG tablet Take 1 tablet (0.6 mg total) by mouth daily as needed (gout). 10/13/14   Joaquim Nam, MD  doxazosin (CARDURA) 8 MG tablet Take 8 mg by mouth daily.  09/09/14   [provider]  famciclovir (FAMVIR) 500 MG tablet TAKE 3 TABLETS BY MOUTH DAILY AS NEEDED 09/27/16   Joaquim Nam, MD  finasteride (PROSCAR) 5 MG tablet Take 5 mg by mouth daily.      [provider]  FLUoxetine (PROZAC) 20 MG capsule TAKE 1 CAPSULE BY MOUTH EVERY DAY 04/07/16   Joaquim Nam, MD  hydrochlorothiazide (HYDRODIURIL) 25 MG tablet TAKE 1/2 TABLET(12.5 MG) BY MOUTH DAILY AS  NEEDED 03/04/16   Joaquim Nam, MD  Misc Natural Products (PROSTATE) CAPS Take by mouth daily.    [provider]  Misc Natural Products (PUMPKIN SEED OIL) CAPS Take by mouth daily.    [provider]  OVER THE COUNTER MEDICATION Take 1 tablet by mouth at bedtime. Cholesterol Complete.    [provider]  oxybutynin (DITROPAN XL) 15 MG 24 hr tablet Take 7.5 mg by mouth at bedtime.     [provider]  oxyCODONE-acetaminophen (PERCOCET/ROXICET) 5-325 MG tablet Take 1-2 tablets by mouth every 6 (six) hours as needed for severe pain. Post-operatively 07/02/16   Sebastian Ache, MD  rivaroxaban (XARELTO) 20 MG TABS tablet Take 1 tablet (20 mg total) by mouth daily with supper. 09/18/15   Allred, Fayrene Fearing, MD  senna-docusate (SENOKOT-S) 8.6-50 MG tablet Take 1 tablet by mouth 2 (two) times daily. While taking pain meds to prevent constipation 07/02/16   Sebastian Ache, MD  sulfamethoxazole-trimethoprim (BACTRIM DS,SEPTRA DS) 800-160 MG tablet Take 1 tablet by mouth 2 (two) times daily. To prevent infection while stent in place. 07/02/16   Sebastian Ache, MD  zolpidem (AMBIEN) 10 MG tablet TAKE 1  TABLET BY MOUTH AT BEDTIME AS NEEDED 05/10/16   Joaquim Nam, MD    Family History Family History  Problem Relation Age of Onset  . Dementia Mother   . Breast cancer Mother        metastatic   . Heart attack Father   . Hypertension Father   . Aneurysm Sister   . Colon cancer Neg Hx   . Anesthesia problems Neg Hx   . Prostate cancer Neg Hx     Social History Social History   Tobacco Use  . Smoking status: Former Smoker    Packs/day: 1.00    Years: 30.00    Pack years: 30.00    Types: Cigarettes    Last attempt to quit: 08/26/1990    Years since quitting: 27.8  . Smokeless tobacco: Never Used  Substance Use Topics  . Alcohol use: Yes    Alcohol/week: 6.0 standard drinks    Types: 6 Cans of beer per week    Comment: moderate ETOH use, only drinks on weekends  . Drug use: No     Allergies   Morphine sulfate   Review of Systems Review of Systems  All other systems reviewed and are negative.    Physical Exam Updated Vital Signs BP 100/87 (BP Location: Right Arm)   Pulse 65   Resp 18   Ht 6' (1.829 m)   Wt 136.1 kg   SpO2 94%   BMI 40.69 kg/m   Physical Exam  Constitutional: He is oriented to person, place, and time. He appears well-developed and well-nourished.  HENT:  Head: Normocephalic and atraumatic.  Eyes: Pupils are equal, round, and reactive to light. Conjunctivae are normal. Right eye exhibits no discharge. Left eye exhibits no discharge. No scleral icterus.  Neck: Normal range of motion. No JVD present. No tracheal deviation present.  Pulmonary/Chest: Effort normal and breath sounds normal. No stridor. No respiratory distress. He has no wheezes. He has no rales. He exhibits no tenderness.  Musculoskeletal: He exhibits no edema.  Left knee with obvious joint effusion, tenderness palpation along anterior and lateral joint lines, no significant redness, minor warmth to touch patient is able to range the knee to approximately 90 degrees with  significant discomfort-no overlying cellulitis-no wounds noted  Neurological: He is alert and oriented to person,  place, and time. Coordination normal.  Psychiatric: He has a normal mood and affect. His behavior is normal. Judgment and thought content normal.  Nursing note and vitals reviewed.    ED Treatments / Results  Labs (all labs ordered are listed, but only abnormal results are displayed) Labs Reviewed  CBC WITH DIFFERENTIAL/PLATELET - Abnormal; Notable for the following components:      Result Value   WBC 13.9 (*)    Neutro Abs 12.1 (*)    All other components within normal limits  BASIC METABOLIC PANEL - Abnormal; Notable for the following components:   Potassium 3.3 (*)    Glucose, Bld 218 (*)    All other components within normal limits  CBG MONITORING, ED - Abnormal; Notable for the following components:   Glucose-Capillary 147 (*)    All other components within normal limits  BODY FLUID CULTURE  SYNOVIAL CELL COUNT + DIFF, W/ CRYSTALS  GLUCOSE, BODY FLUID OTHER    EKG None  Radiology Dg Knee Complete 4 Views Left  Result Date: 07/13/2018 CLINICAL DATA:  Knee pain and swelling. Patient awoke with pain this morning. EXAM: LEFT KNEE - COMPLETE 4+ VIEW COMPARISON:  None. FINDINGS: Left total knee arthroplasty is noted. A large joint effusion is present. The knee is located. Prostheses are intact. IMPRESSION: 1. Large joint effusion without acute or focal osseous abnormality. Question infection or internal derangement. 2. 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 Electronically Signed   By: Marin Roberts M.D.   On: 07/13/2018 11:28    Procedures .Joint Aspiration/Arthrocentesis Date/Time: 07/14/2018 2:57 PM Performed by: Eyvonne Mechanic, PA-C Authorized by: Eyvonne Mechanic, PA-C   Consent:    Consent obtained:  Verbal   Consent given by:  Patient   Risks discussed:  Bleeding, incomplete drainage, infection, nerve damage, pain and poor cosmetic  result   Alternatives discussed:  Alternative treatment, observation, referral, delayed treatment and no treatment Location:    Location:  Knee   Knee:  L knee Anesthesia (see MAR for exact dosages):    Anesthesia method:  Local infiltration   Local anesthetic:  Lidocaine 2% w/o epi Procedure details:    Preparation: Patient was prepped and draped in usual sterile fashion     Needle gauge:  18 G   Ultrasound guidance: no     Approach:  Lateral   Aspirate amount:  90 cc   Aspirate characteristics:  Cloudy and yellow   Steroid injected: no     Specimen collected: no   Post-procedure details:    Dressing:  Adhesive bandage   Patient tolerance of procedure:  Tolerated well, no immediate complications   (including critical care time)  Medications Ordered in ED Medications  lidocaine (XYLOCAINE) 2 % (with pres) injection 400 mg (400 mg Infiltration Given 07/13/18 1141)  fentaNYL (SUBLIMAZE) injection 50 mcg (50 mcg Intravenous Given 07/13/18 1219)     Initial Impression / Assessment and Plan / ED Course  I have reviewed the triage vital signs and the nursing notes.  Pertinent labs & imaging results that were available during my care of the patient were reviewed by me and considered in my medical decision making (see chart for details).  Clinical Course as of Jul 14 1456  Mon Jul 13, 2018  1525 Body fluid culture [BM]  1525 Basic metabolic panel(!) [BM]  1525 CBG monitoring, ED(!) [BM]  1525 CBC with Differential(!) [BM]    Clinical Course User Index [BM] Eber Hong, MD    Labs: CBG, CBC  BMP  Imaging: DG knee complete 4 view left  Consults:  Therapeutics: Lidocaine, fentanyl  Discharge Meds:   Assessment/Plan: 77 year old male presents today with pain to his left knee.  This is atraumatic.  He is well-appearing with no fever or tachycardia.  Patient does have a history of gout but has never had a in his left knee, he denies any trauma.  Low suspicion for atraumatic  arthrosis, differential including septic arthritis versus gouty arthritis low suspicion for any internal derangement.  I had a discussion with the patient and his wife at bedside with risks and benefits of undergoing arthrocentesis they would like to proceed.  Yellowish cloudy joint aspirate noted, was able to remove approximately 90 cc of fluid.  Fluid currently pending analysis.  Patient was also noted to be oxygenating in the low 90s, nursing staff placed a nasal cannula on the patient, he does not breathe adequately through his nose as this is not providing any supplemental oxygen.  Patient had no signs of hypoxic episodes while here in the ED.  He denies any history of COPD, but family notes past smoking history, use of CPAP and some pursed lip breathing at baseline.  He denies any chest pain or shortness of breath, no signs of respiratory distress presently.  Pt care assigned to oncoming provider pending joint fluid analysis and chest xray results    Final Clinical Impressions(s) / ED Diagnoses   Final diagnoses:  Hyperglycemia  Effusion of left knee       Eyvonne Mechanic, PA-C 07/13/18 1456    Eber Hong, MD 07/13/18 1652    Eyvonne Mechanic, PA-C 07/14/18 1458    Jacalyn Lefevre, MD 07/16/18 [phone]

## 2018-07-13 NOTE — ED Triage Notes (Signed)
Pt arrives EMS from home with c/o left knee pain started last night. Given 50 mcg fentanuyl in route. C/o nausea after neds and given 4 zofran. Paced rhythm

## 2018-07-13 NOTE — Discharge Instructions (Signed)
I have recommended orthopedic evaluation and possible admission to the hospital, you have chosen to leave and go back to [place] to have further testing and treatment at that time.  These take these results which I have printed for you and give them to the orthopedic surgeon immediately.  If you cannot be seen immediately you should go to the emergency department especially for increasing pain or fever.  If you cannot be seen tomorrow go to the emergency department.  Again I have recommended that you be seen today, if this is possible please pursue this, otherwise you may need to go to the emergency department to be seen and connected with orthopedics as this may need a surgical procedure to clean and treat a potential infection in your knee.  In the meantime please start taking cephalexin, 500 mg by mouth 3 times daily and doxycycline, 100 mg by mouth twice daily to treat for potential infection this was recommended by the pharmacist.  Roxicodone, 5 mg as needed every 6 hours for severe pain.

## 2018-07-13 NOTE — ED Notes (Signed)
Pt in xray, will round with pt returns

## 2018-07-13 NOTE — ED Notes (Signed)
Pt oxygen saturation dropped into the 80s while sleeping, pt has hx of sleep apnea and wears CPAP at home. 2L [place] applied

## 2018-07-13 NOTE — ED Notes (Signed)
ED Provider at bedside. 

## 2018-07-14 LAB — GLUCOSE, BODY FLUID OTHER: Glucose, Body Fluid Other: 15 mg/dL

## 2018-07-16 ENCOUNTER — Telehealth (HOSPITAL_BASED_OUTPATIENT_CLINIC_OR_DEPARTMENT_OTHER): Payer: Self-pay | Admitting: *Deleted

## 2018-07-16 LAB — BODY FLUID CULTURE

## 2018-07-17 NOTE — Telephone Encounter (Signed)
Post ED Visit - Positive Culture Follow-up: Successful Patient Follow-Up  Culture assessed and recommendations reviewed by:  []  Enzo Bi, Pharm.D. []  Celedonio Miyamoto, Pharm.D., BCPS AQ-ID []  Garvin Fila, Pharm.D., BCPS []  Georgina Pillion, [address].D., BCPS []  [place], [address].D., BCPS, AAHIVP []  Estella Husk, Pharm.D., BCPS, AAHIVP [x]  Lysle Pearl, PharmD, BCPS []  Phillips Climes, PharmD, BCPS []  Agapito Games, PharmD, BCPS []  Verlan Friends, PharmD  Positive body fluid culture  []  Patient discharged without antimicrobial prescription and treatment is now indicated []  Organism is resistant to prescribed ED discharge antimicrobial []  Patient with positive blood cultures  Changes discussed with ED provider: Elizabeth Sauer PA Needs to return to hospital, as per [facility] pt was contacted on 07/16/2018 and is a inpatient @ hospital in [facility] 07/17/2018, 11:09 AM

## 2018-07-17 NOTE — Progress Notes (Signed)
ED Antimicrobial Stewardship Positive Culture Follow Up   [name] [name] is an 77 y.o. male who presented to [facility] on 07/13/2018 with a chief complaint of  Chief Complaint  Patient presents with  . Knee Pain    Recent Results (from the past 720 hour(s))  Body fluid culture     Status: None   Collection Time: 07/13/18  1:31 PM  Result Value Ref Range Status   Specimen Description KNEE FLUID  Final   Special Requests LEFT  Final   Gram Stain   Final    MODERATE WBC PRESENT, PREDOMINANTLY PMN NO ORGANISMS SEEN Performed at [facility], [address], [place], [place] [address]    Culture   Final    RARE STREPTOCOCCUS MITIS/ORALIS CRITICAL RESULT CALLED TO, READ BACK BY AND VERIFIED WITH: A. HARTLEY, ([facility]) AT 1505 ON 07/16/18 BY C. JESSUP, MLT.    Report Status 07/16/2018 FINAL  Final   Organism ID, Bacteria STREPTOCOCCUS MITIS/ORALIS  Final      Susceptibility   Streptococcus mitis/oralis - MIC*    TETRACYCLINE >=16 RESISTANT Resistant     VANCOMYCIN 0.5 SENSITIVE Sensitive     CLINDAMYCIN <=0.25 SENSITIVE Sensitive     * RARE STREPTOCOCCUS MITIS/ORALIS    [x]  Treated with cephalexin + doxycycline, organism resistant to doxycycline and cephalexin may or may not cover []  Patient discharged originally without antimicrobial agent and treatment is now indicated  New antibiotic prescription: If pt with ANY symptoms at all, he should return to the hospital for treatment of possible septic arthritis. If he is feeling fine, then continue current treatment  ED Provider: Elizabeth Sauer, PA   [name], [name] 07/17/2018, 9:11 AM Clinical Pharmacist Monday - Friday phone -  [phone] Saturday - Sunday phone - [phone]

## 2020-11-16 IMAGING — DX DG KNEE COMPLETE 4+V*L*
5 series · 5 of 5 positions shown · non-contrast
Comparison: None.

CLINICAL DATA: Knee pain and swelling. Patient awoke with pain this
morning.

EXAM:
LEFT KNEE - COMPLETE 4+ VIEW

[knee ap]
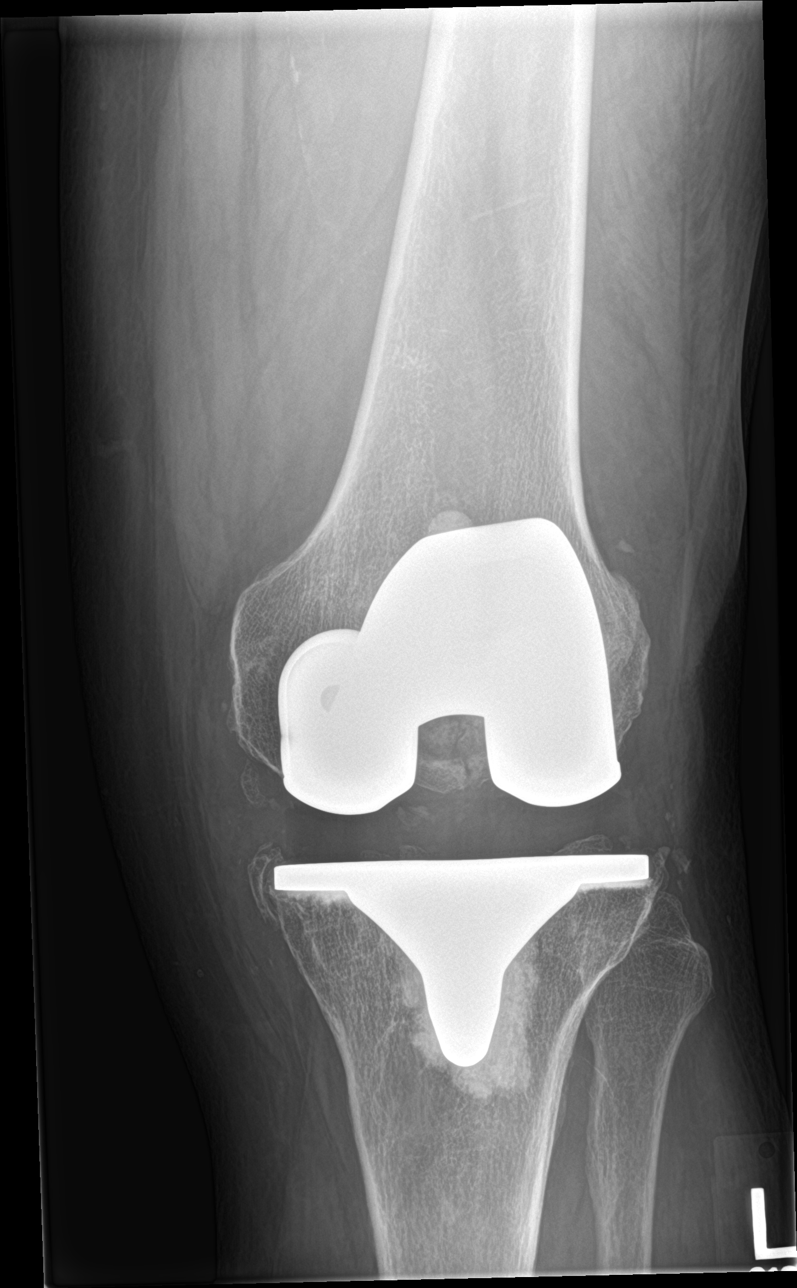

[knee lat (1 of 2)]
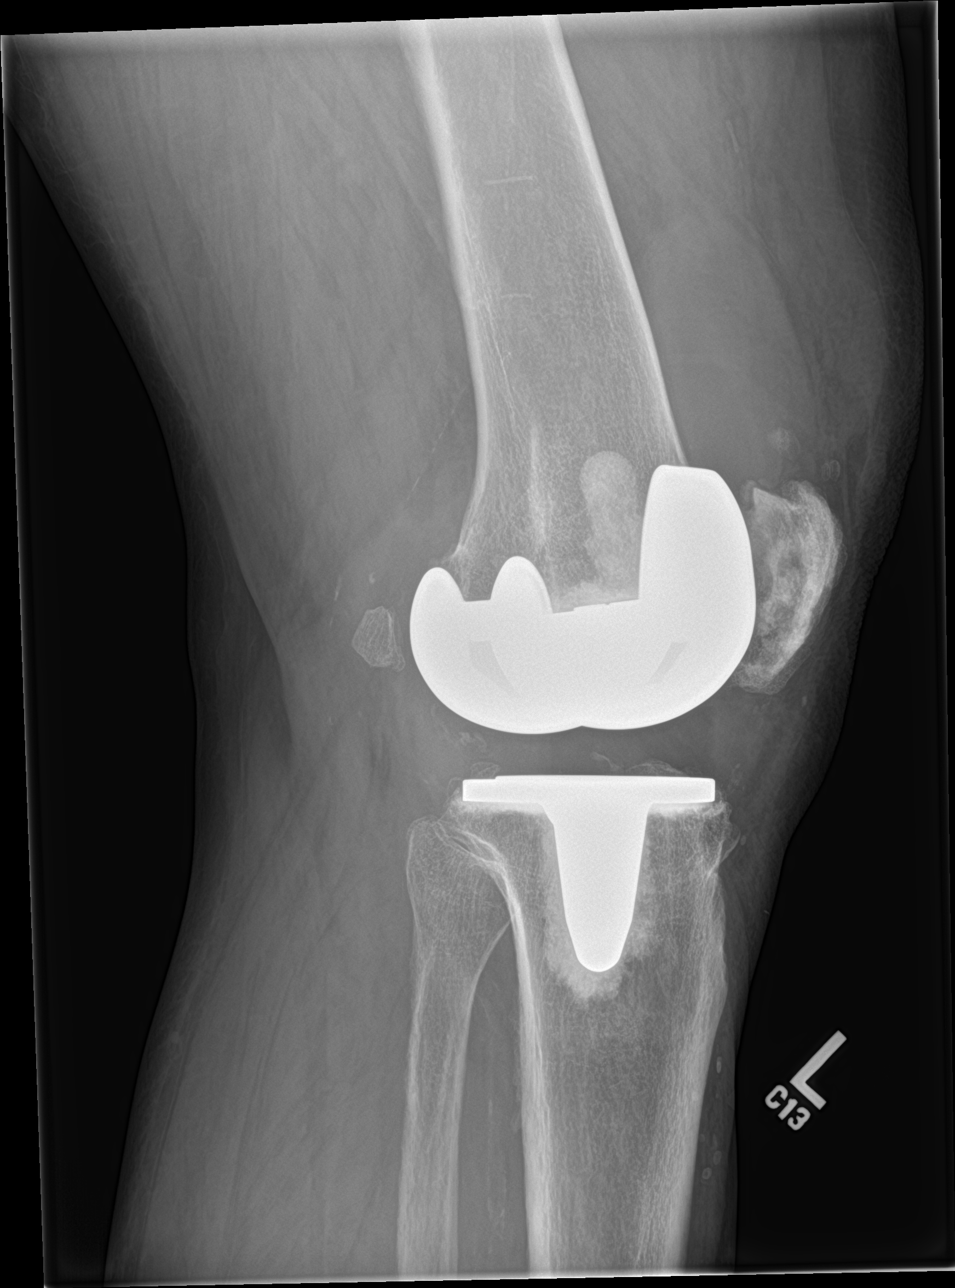

[knee obl (1 of 2)]
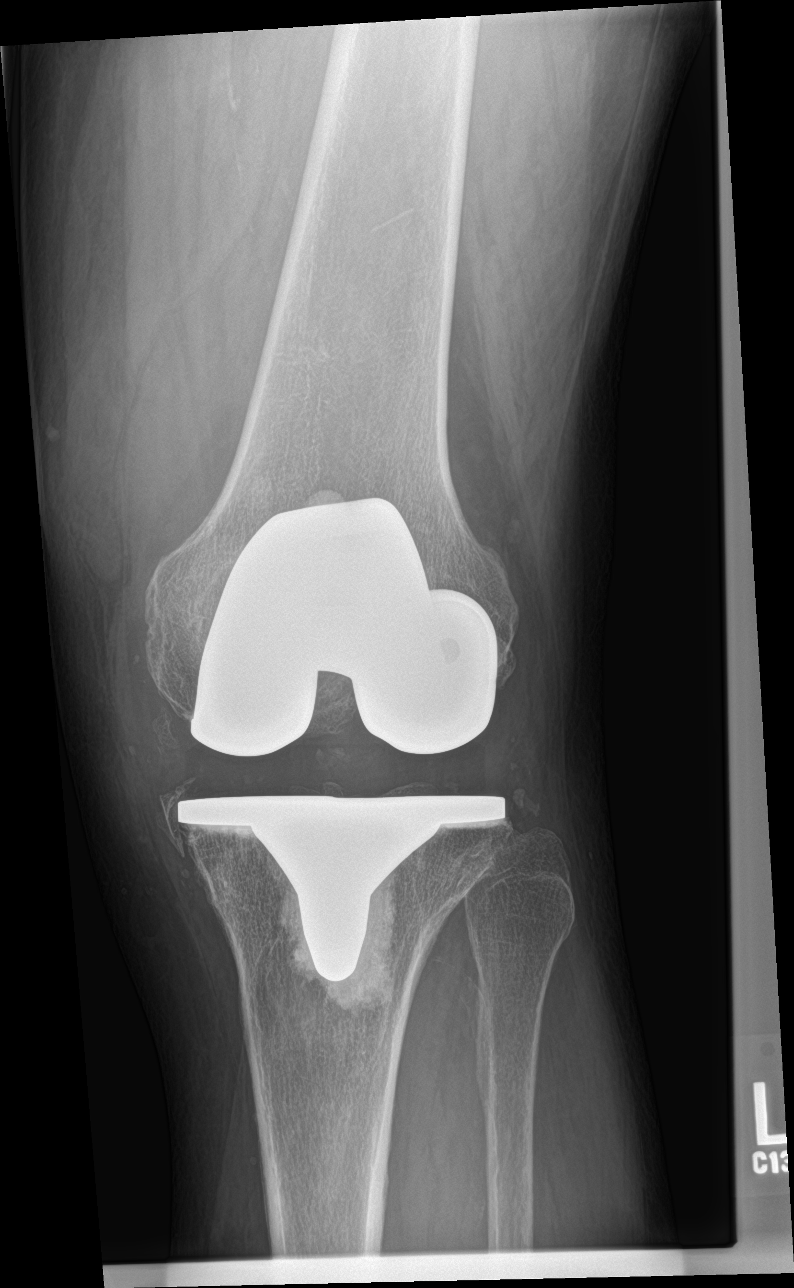

[knee obl (2 of 2)]
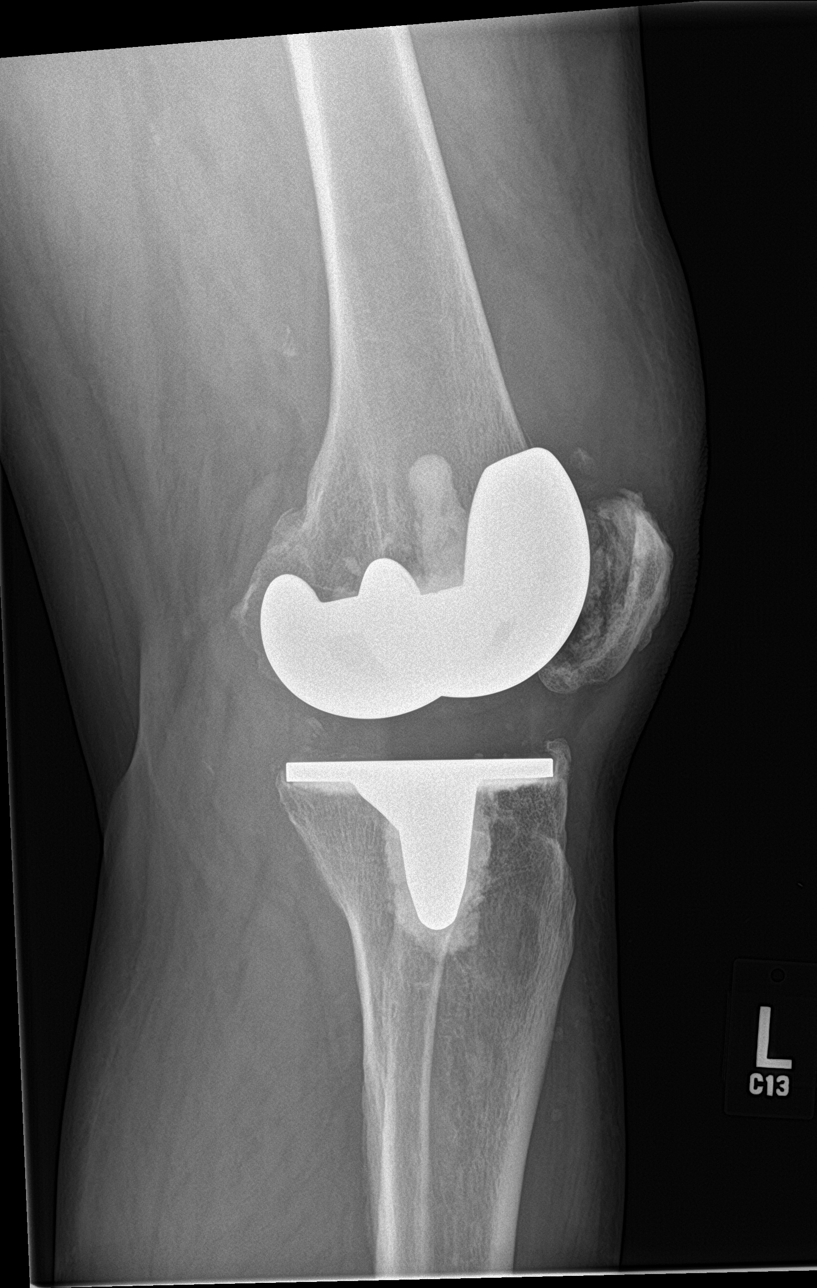

[knee lat (2 of 2)]
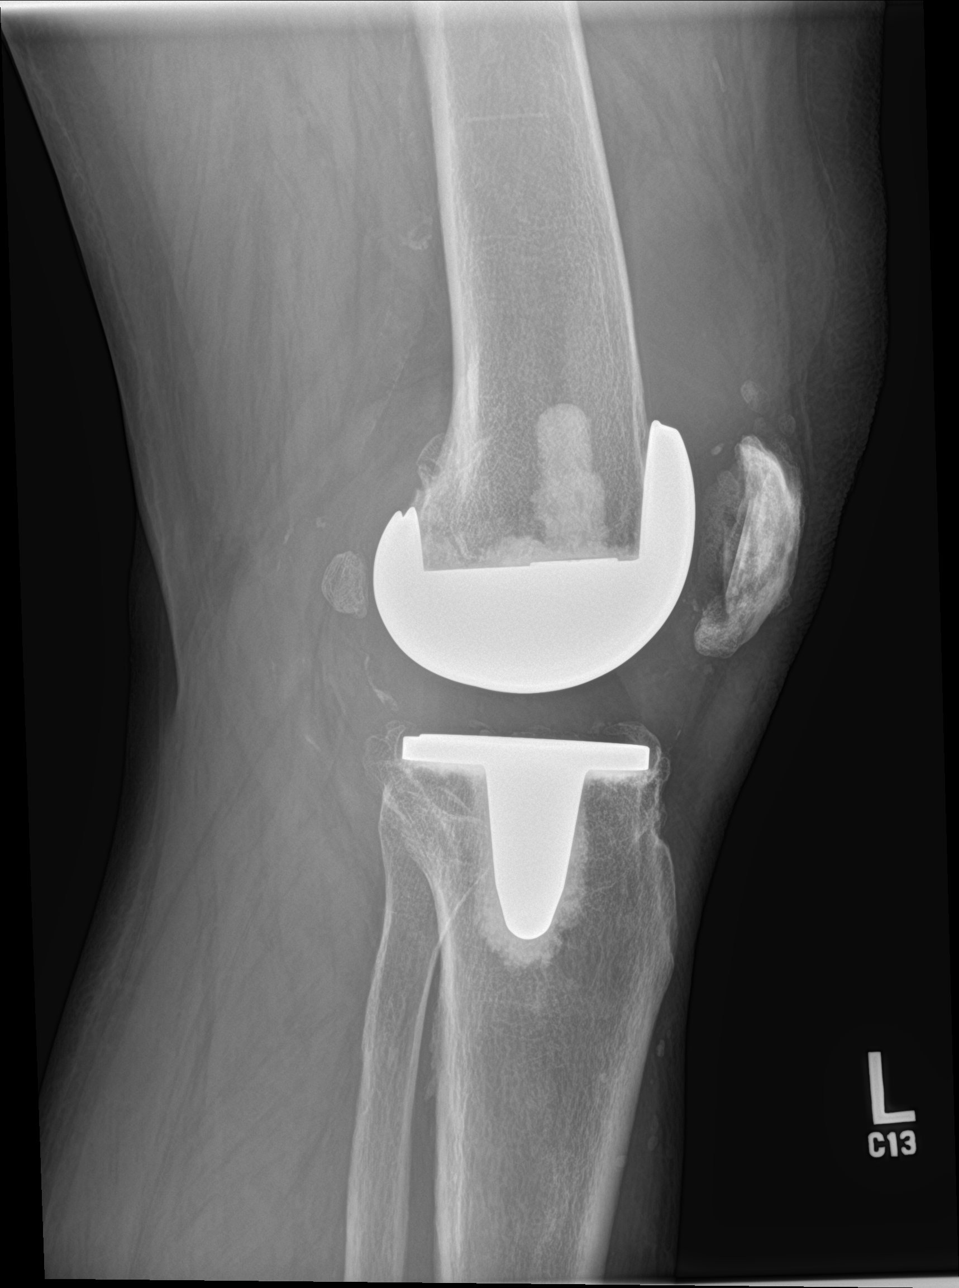

[5 of 5 positions shown; findings below may reference images not displayed]

FINDINGS: Left total knee arthroplasty is noted. A large joint effusion is
present. The knee is located. Prostheses are intact.
IMPRESSION: 1. Large joint effusion without acute or focal osseous abnormality.
Question infection or internal derangement.
2. Left total knee arthroplasty is intact.
